# Patient Record
Sex: Female | Born: 1955 | Race: White | Hispanic: No | Marital: Married | Smoking: Former smoker
Health system: Southern US, Community
[De-identification: ages and names within clinical notes are randomized; demographics above are authoritative.]

## PROBLEM LIST (undated history)

## (undated) DIAGNOSIS — E785 Hyperlipidemia, unspecified: Secondary | ICD-10-CM

## (undated) HISTORY — PX: CHOLECYSTECTOMY: SHX55

## (undated) HISTORY — PX: BREAST REDUCTION SURGERY: SHX8

## (undated) HISTORY — DX: Hyperlipidemia, unspecified: E78.5

---

## 2001-04-15 ENCOUNTER — Emergency Department (HOSPITAL_COMMUNITY): Admission: EM | Admit: 2001-04-15 | Discharge: 2001-04-16 | Payer: Self-pay | Admitting: Emergency Medicine

## 2001-04-16 ENCOUNTER — Encounter: Payer: Self-pay | Admitting: Emergency Medicine

## 2001-04-16 ENCOUNTER — Ambulatory Visit (HOSPITAL_COMMUNITY): Admission: RE | Admit: 2001-04-16 | Discharge: 2001-04-16 | Payer: Self-pay | Admitting: Emergency Medicine

## 2003-10-01 ENCOUNTER — Other Ambulatory Visit: Admission: RE | Admit: 2003-10-01 | Discharge: 2003-10-01 | Payer: Self-pay | Admitting: Obstetrics and Gynecology

## 2005-03-31 ENCOUNTER — Other Ambulatory Visit: Admission: RE | Admit: 2005-03-31 | Discharge: 2005-03-31 | Payer: Self-pay | Admitting: Obstetrics and Gynecology

## 2019-03-22 DIAGNOSIS — M25559 Pain in unspecified hip: Secondary | ICD-10-CM

## 2019-03-22 HISTORY — DX: Pain in unspecified hip: M25.559

## 2019-11-04 ENCOUNTER — Ambulatory Visit (HOSPITAL_COMMUNITY)
Admission: EM | Admit: 2019-11-04 | Discharge: 2019-11-04 | Disposition: A | Discharge status: Home / Self Care | Payer: 59 | Attending: Family Medicine | Admitting: Family Medicine

## 2019-11-04 ENCOUNTER — Other Ambulatory Visit: Payer: Self-pay

## 2019-11-04 ENCOUNTER — Encounter (HOSPITAL_COMMUNITY): Payer: Self-pay

## 2019-11-04 DIAGNOSIS — N3090 Cystitis, unspecified without hematuria: Secondary | ICD-10-CM | POA: Diagnosis present

## 2019-11-04 LAB — POCT URINALYSIS DIPSTICK, ED / UC
Bilirubin Urine: NEGATIVE
Glucose, UA: >=1000 mg/dL — AB
Ketones, ur: 40 mg/dL — AB
Nitrite: NEGATIVE
Protein, ur: NEGATIVE mg/dL
Specific Gravity, Urine: 1.01 (ref 1.005–1.030)
Urobilinogen, UA: 0.2 mg/dL (ref 0.0–1.0)
pH: 5 (ref 5.0–8.0)

## 2019-11-04 NOTE — ED Provider Notes (Signed)
[facility]    CSN: [id number] Arrival date & time: 11/04/19  1628      History   Chief Complaint Chief Complaint  Patient presents with  . Dysuria    HPI Joann Campos is a 64 y.o. female.   She is presenting with increased urine frequency as well as dysuria.  Symptoms been ongoing for 3 days.  Denies any fevers or chills.  Has not tried any home modalities.  HPI  History reviewed. No pertinent past medical history.  There are no problems to display for this patient.   Past Surgical History:  Procedure Laterality Date  . CHOLECYSTECTOMY      OB History   No obstetric history on file.      Home Medications    Prior to Admission medications   Not on File    Family History Family History  Family history unknown: Yes    Social History Social History   Tobacco Use  . Smoking status: Former Research scientist (life sciences)  . Smokeless tobacco: Never Used  Substance Use Topics  . Alcohol use: Never  . Drug use: Never     Allergies   Patient has no known allergies.   Review of Systems Review of Systems  See HPI  Physical Exam Triage Vital Signs ED Triage Vitals  Enc Vitals Group     BP 11/04/19 1742 (!) 149/73     Pulse Rate 11/04/19 1742 94     Resp 11/04/19 1742 16     Temp 11/04/19 1742 97.8 F (36.6 C)     Temp src --      SpO2 11/04/19 1742 99 %     Weight --      Height --      Head Circumference --      Peak Flow --      Pain Score 11/04/19 1739 0     Pain Loc --      Pain Edu? --      Excl. in [place]? --    No data found.  Updated Vital Signs BP (!) 149/73   Pulse 94   Temp 97.8 F (36.6 C)   Resp 16   SpO2 99%   Visual Acuity Right Eye Distance:   Left Eye Distance:   Bilateral Distance:    Right Eye Near:   Left Eye Near:    Bilateral Near:     Physical Exam Gen: NAD, alert, cooperative with exam, well-appearing ENT: normal lips, normal nasal mucosa,  Eye: normal EOM, normal conjunctiva and lids  Skin: no rashes, no  areas of induration  Neuro: normal tone, normal sensation to touch Psych:  normal insight, alert and oriented    UC Treatments / Results  Labs (all labs ordered are listed, but only abnormal results are displayed) Labs Reviewed  POCT URINALYSIS DIPSTICK, ED / UC - Abnormal; Notable for the following components:      Result Value   Glucose, UA >=1000 (*)    Ketones, ur 40 (*)    Hgb urine dipstick LARGE (*)    Leukocytes,Ua TRACE (*)    All other components within normal limits  URINE CULTURE    EKG   Radiology No results found.  Procedures Procedures (including critical care time)  Medications Ordered in UC Medications - No data to display  Initial Impression / Assessment and Plan / UC Course  I have reviewed the triage vital signs and the nursing notes.  Pertinent labs & imaging results that were  available during my care of the patient were reviewed by me and considered in my medical decision making (see chart for details).     Ms. Oyer is a 64 year old female is presenting with urinary complaints.  Urinalysis was revealing for glucose, ketones, hemoglobin and trace leukocytes.  No prior history of diabetes or nephrolithiasis.  Urine culture was sent.  Counseled on close follow-up and repeat urinalysis.  Counseled on supportive care.  Final Clinical Impressions(s) / UC Diagnoses   Final diagnoses:  Cystitis     Discharge Instructions     Please follow up to have the urine tested again  We will call if the culture reveals an infection.     ED Prescriptions    None     PDMP not reviewed this encounter.   Rosemarie Ax, MD 11/04/19 2117

## 2019-11-04 NOTE — ED Triage Notes (Signed)
Pt c/o burning with urination and urinary frequency since Saturday PM.

## 2019-11-04 NOTE — Discharge Instructions (Addendum)
Please follow up to have the urine tested again  We will call if the culture reveals an infection.

## 2019-11-06 ENCOUNTER — Telehealth (HOSPITAL_COMMUNITY): Payer: Self-pay | Admitting: Emergency Medicine

## 2019-11-06 LAB — URINE CULTURE: Culture: 70000 — AB

## 2019-11-06 MED ORDER — NITROFURANTOIN MONOHYD MACRO 100 MG PO CAPS: 100.0000 mg | ORAL_CAPSULE | Freq: Two times a day (BID) | ORAL | 0 refills | Status: DC

## 2020-04-15 ENCOUNTER — Ambulatory Visit (HOSPITAL_COMMUNITY)
Admission: EM | Admit: 2020-04-15 | Discharge: 2020-04-15 | Disposition: A | Discharge status: Home / Self Care | Payer: 59 | Attending: Internal Medicine | Admitting: Internal Medicine

## 2020-04-15 ENCOUNTER — Other Ambulatory Visit: Payer: Self-pay

## 2020-04-15 ENCOUNTER — Encounter (HOSPITAL_COMMUNITY): Payer: Self-pay | Admitting: Emergency Medicine

## 2020-04-15 DIAGNOSIS — K648 Other hemorrhoids: Secondary | ICD-10-CM

## 2020-04-15 NOTE — Discharge Instructions (Signed)
Please monitor rectal bleeding If it worsens, you would need Anusol suppositories or Anusol rectal cream If bleeding persists please return to urgent care to be reevaluated.

## 2020-04-15 NOTE — ED Triage Notes (Signed)
Patient c/o hematuria that started this morning.   Patient denies fever  Patient denies burning  w/ urination or ABD pain.   Patient hasn't used any medications for symptoms.

## 2020-04-15 NOTE — ED Provider Notes (Signed)
[facility]    CSN: [id number] Arrival date & time: 04/15/20  3536      History   Chief Complaint Chief Complaint  Patient presents with  . Hematuria    HPI Joann Campos is a 65 y.o. female comes to the urgent care with complaints of bloody stool this morning.  Patient had one episode of diarrhea on stool and she noticed blood in the stool.  She is not sure if the blood was mixed with the stool but found some blood on the toilet paper when she wiped.  She has a history of hemorrhoids.  No dysuria, urgency or frequency.   HPI  Past Medical History:  Diagnosis Date  . Hip pain 2021    There are no problems to display for this patient.   Past Surgical History:  Procedure Laterality Date  . CHOLECYSTECTOMY      OB History   No obstetric history on file.      Home Medications    Prior to Admission medications   Not on File    Family History Family History  Family history unknown: Yes    Social History Social History   Tobacco Use  . Smoking status: Former Research scientist (life sciences)  . Smokeless tobacco: Never Used  Substance Use Topics  . Alcohol use: Never  . Drug use: Never     Allergies   Patient has no known allergies.   Review of Systems Review of Systems  Gastrointestinal: Positive for blood in stool. Negative for rectal pain.  Genitourinary: Negative for hematuria, vaginal bleeding and vaginal discharge.     Physical Exam Triage Vital Signs ED Triage Vitals  Enc Vitals Group     BP 04/15/20 0953 131/85     Pulse Rate 04/15/20 0953 97     Resp 04/15/20 0953 17     Temp 04/15/20 0953 98.6 F (37 C)     Temp Source 04/15/20 0953 Oral     SpO2 04/15/20 0953 99 %     Weight 04/15/20 0949 132 lb (59.9 kg)     Height 04/15/20 0949 5\' 5"  (1.651 m)     Head Circumference --      Peak Flow --      Pain Score 04/15/20 0949 0     Pain Loc --      Pain Edu? --      Excl. in [place]? --    No data found.  Updated Vital Signs BP 131/85 (BP  Location: Right Arm)   Pulse 97   Temp 98.6 F (37 C) (Oral)   Resp 17   Ht 5\' 5"  (1.651 m)   Wt 59.9 kg   SpO2 99%   BMI 21.97 kg/m   Visual Acuity Right Eye Distance:   Left Eye Distance:   Bilateral Distance:    Right Eye Near:   Left Eye Near:    Bilateral Near:     Physical Exam Vitals and nursing note reviewed.  Constitutional:      General: She is not in acute distress.    Appearance: She is not ill-appearing.  Cardiovascular:     Rate and Rhythm: Normal rate and regular rhythm.     Pulses: Normal pulses.     Heart sounds: Normal heart sounds.  Pulmonary:     Effort: Pulmonary effort is normal.     Breath sounds: Normal breath sounds.  Neurological:     Mental Status: She is alert.      UC Treatments /  Results  Labs (all labs ordered are listed, but only abnormal results are displayed) Labs Reviewed - No data to display  EKG   Radiology No results found.  Procedures Procedures (including critical care time)  Medications Ordered in UC Medications - No data to display  Initial Impression / Assessment and Plan / UC Course  I have reviewed the triage vital signs and the nursing notes.  Pertinent labs & imaging results that were available during my care of the patient were reviewed by me and considered in my medical decision making (see chart for details).     1.  Bleeding internal hemorrhoids, currently subsided Patient is advised to monitor for further bleeding. If recurs, she may benefit from Anusol suppository or cream. Return precautions given Final Clinical Impressions(s) / UC Diagnoses   Final diagnoses:  Internal hemorrhoid, bleeding     Discharge Instructions     Please monitor rectal bleeding If it worsens, you would need Anusol suppositories or Anusol rectal cream If bleeding persists please return to urgent care to be reevaluated.   ED Prescriptions    None     PDMP not reviewed this encounter.   Chase Picket,  MD 04/15/20 1725

## 2020-07-15 ENCOUNTER — Other Ambulatory Visit: Payer: Self-pay

## 2020-07-15 ENCOUNTER — Ambulatory Visit (INDEPENDENT_AMBULATORY_CARE_PROVIDER_SITE_OTHER): Payer: 59 | Admitting: Family Medicine

## 2020-07-15 ENCOUNTER — Encounter: Payer: Self-pay | Admitting: Family Medicine

## 2020-07-15 DIAGNOSIS — M25511 Pain in right shoulder: Secondary | ICD-10-CM

## 2020-07-15 MED ORDER — MELOXICAM 15 MG PO TABS: 7.5000 mg | ORAL_TABLET | Freq: Every day | ORAL | 6 refills | Status: DC | PRN

## 2020-07-15 NOTE — Progress Notes (Signed)
I saw and examined the patient with Dr. Elouise Munroe and agree with assessment and plan as outlined.    Acute right shoulder pain, no injury.  Exam consistent with impingement.  Will try mobic, home exercises.  If pain worsens, PT and/or injection.

## 2020-07-15 NOTE — Progress Notes (Signed)
Office Visit Note   Patient: Joann Campos           Date of Birth: [birth year]           MRN: [id number] Visit Date: 07/15/2020 Requested by: No referring provider defined for this encounter. PCP: Patient, No Pcp Per (Inactive)  Subjective: Chief Complaint  Patient presents with  . Right Shoulder - Pain    Pain came on about 2 weeks ago and has been worsening. NKI. Today the pain is not as bad. Decreased ROM due to pain.     HPI: 65yo F presenting to clinic with concerns of approximately 2 months of worsening, atraumatic right shoulder pain. Patient states that she has no idea what she did to inflame her shoulder, but does admit that she tends to sleep on her right side, and may have slept wrong. Pain is worsened with reaching across her body, or overhead. She says that she does 76min exercise videos daily, which don't significantly worsen her pain, and that she did not change this routine prior to the onset of her symptoms. She says she has been trying to rest the shoulder, thinking this might help, but it hasn't offered any relief. Says that her pain has progressed to a near constant ache, which is very painful if she rolls onto the affected side at night. No neck pain, no weakness in the hand/arm.               ROS:   All other systems were reviewed and are negative.  Objective: Vital Signs: There were no vitals taken for this visit.  Physical Exam:  General:  Alert and oriented, in no acute distress. Pulm:  Breathing unlabored. Psy:  Normal mood, congruent affect. Skin:  Right shoulder with no bruising, rashes, or erythema. Overlying skin intact.   Right Shoulder Exam:  Inspection: Symmetric muscle mass, no atrophy or deformity, no scars. Palpation: Endorses tenderness around the area of the subacromial space. No tenderness over the [facility] joint or biceps insertion.   Range of motion: Full range of motion in forward flexion, abduction and extension.  Full External rotation to 90  degrees. Apley scratch symmetrical at approximately level of T5 vertebrae. Does endorse some discomfort with Abduction above 90*.  Rotator cuff testing:  Full strength though endorses pain with empty can (supraspinatus).  External rotation with full strength though endorses some pain. Full strength and no pain with internal rotation.   Biceps/Labral testing: O'Brien's/speeds with full strength and no pain. Biceps load II: Negative Crank test: Negative  Impingement testing: Endorses pain with Hawkins  Strength testing:  5 out of 5 strength with shoulder abduction (C5), wrist extension (C6), wrist flexion (C7), grip strength (C8), and finger abduction (T1).  Sensation: Intact to light touch throughout bilateral upper extremities.   Brisk distal capillary refill.   Imaging: No results found.  Assessment & Plan: 65yo F presenting to clinic with right shoulder impingement. No weakness on examination, which is very reassuring against a rotator cuff tear as a source of her discomfort.  - Will start with conservative treatment including rotator cuff home exercises as well as oral NSAID therapy - Discussed safe NSAID use, and will provide with Mobic - Emailed exercise video program - RTC if no benefit in 4 weeks of HEP, at which time could consider subacromial injection. - Patient expresses understanding with plan. She has no further questions or concerns today.      Procedures: No procedures performed  PMFS History: There are no problems to display for this patient.  Past Medical History:  Diagnosis Date  . Hip pain 2021    Family History  Family history unknown: Yes    Past Surgical History:  Procedure Laterality Date  . CHOLECYSTECTOMY     Social History   Occupational History  . Not on file  Tobacco Use  . Smoking status: Former Research scientist (life sciences)  . Smokeless tobacco: Never Used  Substance and Sexual Activity  . Alcohol use: Never  . Drug use: Never  . Sexual  activity: Not on file

## 2020-12-04 ENCOUNTER — Encounter: Payer: Self-pay | Admitting: Surgical

## 2020-12-04 ENCOUNTER — Ambulatory Visit (INDEPENDENT_AMBULATORY_CARE_PROVIDER_SITE_OTHER): Payer: 59 | Admitting: Surgical

## 2020-12-04 ENCOUNTER — Other Ambulatory Visit: Payer: Self-pay

## 2020-12-04 ENCOUNTER — Ambulatory Visit: Payer: Self-pay

## 2020-12-04 DIAGNOSIS — M7541 Impingement syndrome of right shoulder: Secondary | ICD-10-CM

## 2020-12-04 DIAGNOSIS — M542 Cervicalgia: Secondary | ICD-10-CM | POA: Diagnosis not present

## 2020-12-04 DIAGNOSIS — M25511 Pain in right shoulder: Secondary | ICD-10-CM

## 2020-12-04 MED ORDER — BUPIVACAINE HCL 0.5 % IJ SOLN
9.0000 mL | INTRAMUSCULAR | Status: AC | PRN
  Administered 2020-12-04: 9 mL via INTRA_ARTICULAR

## 2020-12-04 MED ORDER — LIDOCAINE HCL 1 % IJ SOLN
5.0000 mL | INTRAMUSCULAR | Status: AC | PRN
  Administered 2020-12-04: 5 mL

## 2020-12-04 MED ORDER — METHYLPREDNISOLONE ACETATE 40 MG/ML IJ SUSP
40.0000 mg | INTRAMUSCULAR | Status: AC | PRN
  Administered 2020-12-04: 40 mg via INTRA_ARTICULAR

## 2020-12-04 NOTE — Progress Notes (Signed)
Office Visit Note   Patient: Joann Campos           Date of Birth: [birth year]           MRN: [id number] Visit Date: 12/04/2020 Requested by: No referring provider defined for this encounter. PCP: Patient, No Pcp Per (Inactive)  Subjective: Chief Complaint  Patient presents with   Right Shoulder - Follow-up    HPI: Joann Campos is a 65 y.o. female who presents to the office complaining of right shoulder pain.  Patient localizes pain to the lateral aspect of the shoulder with radiation to the mid humerus.  Complains of 5/10 pain.  Is aching.  Sharp with movement.  Takes over-the-counter medications mostly such as NSAIDs or Tylenol.  She she saw Dr. Junius Roads several months ago who recommended home exercise program and she did not want injection at the time so she did not have any.  She has no history of previous surgery to the shoulder.  Denies any neck pain, scapular pain, numbness/tingling down the arm.  No history of diabetes, smoking, thyroid disorders.  No recent falls or injuries.  It does not wake her up from sleep at night but does kind of keep her from going to sleep.  No significant pain with specific activities that she is noted.  She denies any significant weakness but she does feel like there is some slight weakness at times but this is mostly due to pain in her opinion..                ROS: All systems reviewed are negative as they relate to the chief complaint within the history of present illness.  Patient denies fevers or chills.  Assessment & Plan: Visit Diagnoses:  1. Right shoulder pain, unspecified chronicity   2. Neck pain     Plan: Patient is a 65 year old female who presents complaint of right shoulder pain.  She has history of right shoulder pain over the last several months with insidious onset and no history of injury.  No history of prior surgery.  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  No acute injuries noted.  Discussed options available to patient.  After discussion, plan to try right shoulder subacromial injection.  She tolerated the injection well.  Plan to follow-up in 6 weeks for clinical recheck and if no improvement at that time could consider MRI arthrogram of the right shoulder for further evaluation of rotator cuff pathology given the nighttime pain she is having more frequently now..  Follow-Up Instructions: No follow-ups on file.   Orders:  Orders Placed This Encounter  Procedures   XR Shoulder Right   XR Cervical Spine 2 or 3 views   No orders of the defined types were placed in this encounter.     Procedures: Large Joint Inj: R subacromial bursa on 12/04/2020 5:28 PM Indications: diagnostic evaluation and pain Details: 18 G 1.5 in needle, posterior approach  Arthrogram: No  Medications: 9 mL bupivacaine 0.5 %; 40 mg methylPREDNISolone acetate 40 MG/ML; 5 mL lidocaine 1 % Outcome: tolerated well, no immediate complications Procedure, treatment alternatives, risks and benefits explained, specific risks discussed. Consent was given by the patient. Immediately prior to procedure a time out was called to verify the correct patient, procedure, equipment, support staff and site/side marked as required. Patient was prepped and draped in the usual sterile fashion.  Clinical Data: No additional findings.  Objective: Vital Signs: There were no vitals taken for this visit.  Physical Exam:  Constitutional: Patient appears well-developed HEENT:  Head: Normocephalic Eyes:EOM are normal Neck: Normal range of motion Cardiovascular: Normal rate Pulmonary/chest: Effort normal Neurologic: Patient is alert Skin: Skin is warm Psychiatric: Patient has normal mood and affect  Ortho Exam: Ortho exam demonstrates right shoulder with 60 degrees external Tatian, 90 degrees abduction, 160 degrees forward flexion.   Excellent rotator cuff strength of supra, infra, subscap.  No significant tenderness over the [facility] joint.  No tenderness of the bicipital groove.  5/5 motor strength of bilateral grip strength, finger abduction, pronation/supination, bicep, tricep, deltoid.  No tenderness of the axial cervical spine.  Negative Spurling sign.  Negative Hornblower sign.  Negative belly press test.  Positive Hawkins and Neer's impingement sign.  Specialty Comments:  No specialty comments available.  Imaging: No results found.   PMFS History: There are no problems to display for this patient.  Past Medical History:  Diagnosis Date   Hip pain 2021    Family History  Family history unknown: Yes    Past Surgical History:  Procedure Laterality Date   CHOLECYSTECTOMY     Social History   Occupational History   Not on file  Tobacco Use   Smoking status: Former   Smokeless tobacco: Never  Substance and Sexual Activity   Alcohol use: Never   Drug use: Never   Sexual activity: Not on file

## 2021-01-21 ENCOUNTER — Other Ambulatory Visit: Payer: Self-pay

## 2021-01-21 ENCOUNTER — Ambulatory Visit: Payer: Self-pay

## 2021-01-21 ENCOUNTER — Ambulatory Visit (INDEPENDENT_AMBULATORY_CARE_PROVIDER_SITE_OTHER): Payer: 59 | Admitting: Surgical

## 2021-01-21 ENCOUNTER — Encounter: Payer: Self-pay | Admitting: Surgical

## 2021-01-21 DIAGNOSIS — M7541 Impingement syndrome of right shoulder: Secondary | ICD-10-CM

## 2021-01-21 DIAGNOSIS — M25511 Pain in right shoulder: Secondary | ICD-10-CM

## 2021-01-21 DIAGNOSIS — M7501 Adhesive capsulitis of right shoulder: Secondary | ICD-10-CM | POA: Diagnosis not present

## 2021-01-21 MED ORDER — LIDOCAINE HCL 1 % IJ SOLN
5.0000 mL | INTRAMUSCULAR | Status: AC | PRN
  Administered 2021-01-21: 5 mL

## 2021-01-21 MED ORDER — METHYLPREDNISOLONE ACETATE 40 MG/ML IJ SUSP
40.0000 mg | INTRAMUSCULAR | Status: AC | PRN
  Administered 2021-01-21: 40 mg via INTRA_ARTICULAR

## 2021-01-21 MED ORDER — BUPIVACAINE HCL 0.5 % IJ SOLN
9.0000 mL | INTRAMUSCULAR | Status: AC | PRN
  Administered 2021-01-21: 9 mL via INTRA_ARTICULAR

## 2021-01-21 NOTE — Progress Notes (Signed)
Office Visit Note   Patient: Joann Campos           Date of Birth: 1955/10/30           MRN: [id number] Visit Date: 01/21/2021 Requested by: No referring provider defined for this encounter. PCP: Patient, No Pcp Per (Inactive)  Subjective: No chief complaint on file.   HPI: Joann Campos is a 65 y.o. female who returns to the office for follow-up visit.  She is 6 weeks out from right shoulder cortisone injection.  Patient now returns with no improvement compared with prior appointment.  Reports she had no relief from the injection even for a day.  She reports progressive worsening of right shoulder pain since the last visit about 6 weeks ago.  Continues to complain of pain primarily in the lateral aspect of the right shoulder as well as some mid humerus pain.  No radicular pain or numbness or tingling.  She has no neck pain.  She specifically feels pain when she tries to externally rotate her arm or AB duct her arm away from her body.  She denies any precipitating injury that she can remember.  She does endorse history of adhesive capsulitis of the left shoulder about 15 years ago that she treated successfully with physical therapy.  She denies any weakness today.  No mechanical symptoms.  She has difficulty sleeping at night and this is a bigger problem for her than it was at her last visit.  Pain is not waking her up from night but she does notice when she wakes at night from other problems that her left shoulder is very achy.  She finds it difficult to lay on the right side.  Denies any history of diabetes or any thyroid disorders.              ROS: All systems reviewed are negative as they relate to the chief complaint within the history of present illness.  Patient denies fevers or chills.  Assessment & Plan: Visit Diagnoses:  1. Impingement syndrome of right shoulder     Plan: Joann Campos is a 65 y.o. female who returns to the office for follow-up visit for right shoulder  pain following cortisone injection 6 weeks ago.  They now return with no improvement from right shoulder subacromial injection.  She has progressive worsening of her right shoulder pain that she localizes to the lateral and mid humerus regions.  No rotator cuff weakness on exam today but her passive motion of the right shoulder does seem reduced compared with previous exam as well as compared with the contralateral shoulder today.  Most of her pain is with passive motion of the shoulder.  Radiographs of the right shoulder were unremarkable in the past.  Impression after today's examination is right shoulder adhesive capsulitis though patient states that this does not quite feel like the same timeline that she had in the left shoulder 15 years ago. Discussed options available to patient.  Main options for Symphonie would be trying glenohumeral cortisone injection today with dedicated home exercise program every day versus MRI arthrogram of the right shoulder to evaluate for rotator cuff tendinopathy versus adhesive capsulitis.  After discussion, patient would like to proceed with glenohumeral injection.  Ultrasound-guided right glenohumeral injection was administered and patient tolerated the procedure well.  She will do home exercise program and allow the cortisone injection to take effect but if she finds no relief in 2 to 3 weeks, she will call the office and  we will schedule her for MRI arthrogram of the right shoulder.  She does have history of claustrophobia so she will require Valium and possibly an open MRI but she may try to tough it out since the normal MRI is more sensitive than the open MRI.  Otherwise, if she is having good relief then she will follow-up in 6 weeks for clinical recheck on the range of motion.  Follow-Up Instructions: No follow-ups on file.   Orders:  No orders of the defined types were placed in this encounter.  No orders of the defined types were placed in this encounter.      Procedures: Large Joint Inj: R glenohumeral on 01/21/2021 11:30 AM Indications: diagnostic evaluation and pain Details: 18 G 1.5 in and 3.5 in needle, ultrasound-guided posterior approach  Arthrogram: No  Medications: 9 mL bupivacaine 0.5 %; 40 mg methylPREDNISolone acetate 40 MG/ML; 5 mL lidocaine 1 % Outcome: tolerated well, no immediate complications Procedure, treatment alternatives, risks and benefits explained, specific risks discussed. Consent was given by the patient. Immediately prior to procedure a time out was called to verify the correct patient, procedure, equipment, support staff and site/side marked as required. Patient was prepped and draped in the usual sterile fashion.      Clinical Data: No additional findings.  Objective: Vital Signs: There were no vitals taken for this visit.  Physical Exam:  Constitutional: Patient appears well-developed HEENT:  Head: Normocephalic Eyes:EOM are normal Neck: Normal range of motion Cardiovascular: Normal rate Pulmonary/chest: Effort normal Neurologic: Patient is alert Skin: Skin is warm Psychiatric: Patient has normal mood and affect  Ortho Exam: Ortho exam demonstrates right shoulder with 40 degrees external rotation, 80 degrees abduction, 155 degrees forward flexion.  This is compared with the left shoulder with 50 degrees external rotation, 100 degrees abduction, 170 degrees forward flexion.  Decreased internal rotation of the right shoulder behind her back by about 8 vertebral levels compared with the left shoulder where she can lift her left hand much higher on her spine.  She has excellent rotator cuff strength of supraspinatus, infraspinatus, subscapularis with no significantly increased pain with rotator cuff strength testing.  Most of her pain is with passive motion of the shoulder when examining her range of motion.  Minimal tenderness in the bicipital groove.  No tenderness over the [facility] joint.  5/5 motor strength of  bilateral grip strength, finger abduction, pronation/supination, bicep, tricep, deltoid.  No tenderness over the axial cervical spine.  Negative Spurling sign.  Negative Lhermitte sign.  Sensation intact through all dermatomes of the bilateral upper extremities.  Specialty Comments:  No specialty comments available.  Imaging: No results found.   PMFS History: There are no problems to display for this patient.  Past Medical History:  Diagnosis Date   Hip pain 2021    Family History  Family history unknown: Yes    Past Surgical History:  Procedure Laterality Date   CHOLECYSTECTOMY     Social History   Occupational History   Not on file  Tobacco Use   Smoking status: Former   Smokeless tobacco: Never  Substance and Sexual Activity   Alcohol use: Never   Drug use: Never   Sexual activity: Not on file

## 2021-03-05 ENCOUNTER — Ambulatory Visit: Payer: 59 | Admitting: Surgical

## 2021-03-05 ENCOUNTER — Telehealth: Payer: Self-pay

## 2021-03-05 NOTE — Telephone Encounter (Signed)
Ok thx.

## 2021-03-05 NOTE — Telephone Encounter (Signed)
Patient had an appointment scheduled for this afternoon to see Luke about her shoulder. Due to OR delays patient was called and asked to come in at a later time as Luke would not be able to make it on time at originally scheduled appt time.  Unfortunately had to call patient again due to the fact longer than expected delays in the OR. Patient was very upset about this and voiced her frustration and concern. I did thoroughly listen to patient and voiced to her I was incredibly sorry for this but that it was out of my control. She proceeded to tell me how we had ruined her entire day and we were delaying her care. I did let patient know that again was out of my control but she could discuss with Dr Dean and Luke. I did work patient in for next Thursday to see Dr Dean to discuss her shoulder pain.  Patient did have a question regarding a COVID vaccine and getting shoulder injection close together.  I promised her that I would try to discuss this with Dr Dean and would call her back before 5:30pm as requested. I discussed this with Dr Dean and he suggested patient choose one or the other. I called patient back and discussed with her. She did tell me she felt she was not talked to in a professional manner and that I never should have told her to discuss her concerns further with Dr Dean she could. I apologized to patient. She continued to say that we were delaying her care due to the issues today. She stated everything regarding her plans she made for the next several day were all interrupted by today. Again, multiple apologies made to patient. She stated that now if she had to have imaging she would not get it done until next year. I explained this is a difficult time of year for most of our patients because like her deductibles have been met and patients are trying to get things done before end of the year. Patient very put out by the fact we did not have sooner available appointment for her. I did work her in as the  last patient on Monday afternoon. I explained to her in detail the schedule was incredibly busy and full and she should expect to have to wait. Wanted you to be aware of what transpired today.  °

## 2021-03-08 ENCOUNTER — Other Ambulatory Visit: Payer: Self-pay

## 2021-03-08 ENCOUNTER — Ambulatory Visit (INDEPENDENT_AMBULATORY_CARE_PROVIDER_SITE_OTHER): Payer: 59 | Admitting: Orthopedic Surgery

## 2021-03-08 ENCOUNTER — Ambulatory Visit: Payer: 59 | Admitting: Orthopedic Surgery

## 2021-03-08 DIAGNOSIS — M7541 Impingement syndrome of right shoulder: Secondary | ICD-10-CM

## 2021-03-08 DIAGNOSIS — M7501 Adhesive capsulitis of right shoulder: Secondary | ICD-10-CM | POA: Diagnosis not present

## 2021-03-09 ENCOUNTER — Telehealth: Payer: Self-pay

## 2021-03-09 MED ORDER — DICLOFENAC SODIUM 75 MG PO TBEC: DELAYED_RELEASE_TABLET | ORAL | 0 refills | Status: DC

## 2021-03-09 NOTE — Telephone Encounter (Signed)
This has been addressed.

## 2021-03-09 NOTE — Telephone Encounter (Signed)
Pt called into the office stating that she has not received a call regarding an apt today with Dr. Ernestina Patches like she stated she was promised she said she would like that call to be made before 10 today.

## 2021-03-09 NOTE — Telephone Encounter (Signed)
Can you send in diclofenac 75 po bid for 2 weeks thx

## 2021-03-09 NOTE — Telephone Encounter (Signed)
Submitted to pharmacy. Tried calling patient to advise done. No answer.

## 2021-03-09 NOTE — Addendum Note (Signed)
Addended byLaurann Montana on: 03/09/2021 02:21 PM   Modules accepted: Orders

## 2021-03-09 NOTE — Telephone Encounter (Signed)
Patient would also like to know if her medication from yesterday was sent in.   Please advise

## 2021-03-10 ENCOUNTER — Ambulatory Visit: Payer: Self-pay

## 2021-03-10 ENCOUNTER — Other Ambulatory Visit: Payer: Self-pay

## 2021-03-10 ENCOUNTER — Encounter: Payer: Self-pay | Admitting: Physical Medicine and Rehabilitation

## 2021-03-10 ENCOUNTER — Ambulatory Visit (INDEPENDENT_AMBULATORY_CARE_PROVIDER_SITE_OTHER): Payer: 59 | Admitting: Physical Medicine and Rehabilitation

## 2021-03-10 ENCOUNTER — Ambulatory Visit: Payer: 59 | Admitting: Physical Therapy

## 2021-03-10 ENCOUNTER — Encounter: Payer: Self-pay | Admitting: Orthopedic Surgery

## 2021-03-10 DIAGNOSIS — M25511 Pain in right shoulder: Secondary | ICD-10-CM | POA: Diagnosis not present

## 2021-03-10 DIAGNOSIS — G8929 Other chronic pain: Secondary | ICD-10-CM | POA: Diagnosis not present

## 2021-03-10 MED ORDER — TRIAMCINOLONE ACETONIDE 40 MG/ML IJ SUSP
40.0000 mg | INTRAMUSCULAR | Status: AC | PRN
  Administered 2021-03-10: 08:00:00 40 mg via INTRA_ARTICULAR

## 2021-03-10 MED ORDER — BUPIVACAINE HCL 0.25 % IJ SOLN
5.0000 mL | INTRAMUSCULAR | Status: AC | PRN
  Administered 2021-03-10: 08:00:00 5 mL via INTRA_ARTICULAR

## 2021-03-10 NOTE — Progress Notes (Signed)
Pt state right shoulder pain. Pt state lifting and moving her right arm makes the pain worse. Pt state she takes pain meds to help ease her pain.  Numeric Pain Rating Scale and Functional Assessment Average Pain 4   In the last MONTH (on 0-10 scale) has pain interfered with the following?  1. General activity like being  able to carry out your everyday physical activities such as walking, climbing stairs, carrying groceries, or moving a chair?  Rating(10)   -BT, -Dye Allergies.

## 2021-03-10 NOTE — Progress Notes (Signed)
° °  Office Visit Note   Patient: Joann Campos           Date of Birth: 1955-10-10           MRN: [id number] Visit Date: 03/08/2021 Requested by: No referring provider defined for this encounter. PCP: Patient, No Pcp Per (Inactive)  Subjective: Chief Complaint  Patient presents with   Right Shoulder - Pain    HPI: Mathew is a 65 year old patient with right shoulder pain.  Injection 01/21/2021 did not really help.  Reports generally constant pain and it hurts when she is doing certain movements.  She is right-hand dominant.  Localizes the pain in the coracoid region as well as deltoid region.  Painful at night.  Overhead is very painful.  Denies any neck pain or numbness and tingling and no mechanical symptoms.  She states she has lost motion since her last clinic visit.  Cannot tie her hair in a ponytail.  Hard for her to take off close.  Motrin has not been particularly beneficial over the past several weeks.              ROS: All systems reviewed are negative as they relate to the chief complaint within the history of present illness.  Patient denies  fevers or chills.   Assessment & Plan: Visit Diagnoses:  1. Adhesive capsulitis of right shoulder     Plan: Impression is adhesive capsulitis right shoulder with definite loss of passive range of motion.  Strength seems pretty reasonable.  Plan is injection with Dr. Ernestina Patches into the glenohumeral joint with physical therapy.  Follow-up in 8 weeks for clinical recheck.  She may need to consider surgical manipulation and rotator interval release at that time.  Voltaren prescribed to be taken instead of ibuprofen.  Follow-Up Instructions: Return in about 8 weeks (around 05/03/2021).   Orders:  No orders of the defined types were placed in this encounter.  No orders of the defined types were placed in this encounter.     Procedures: No procedures performed   Clinical Data: No additional findings.  Objective: Vital Signs: There  were no vitals taken for this visit.  Physical Exam:   Constitutional: Patient appears well-developed HEENT:  Head: Normocephalic Eyes:EOM are normal Neck: Normal range of motion Cardiovascular: Normal rate Pulmonary/chest: Effort normal Neurologic: Patient is alert Skin: Skin is warm Psychiatric: Patient has normal mood and affect   Ortho Exam: Ortho exam demonstrates good cervical spine range of motion with good rotator cuff strength on the left right-hand side infraspinatus supraspinatus subscap muscle testing.  No masses lymphadenopathy or skin changes noted in the right shoulder girdle region.  Range of motion on the left passive is 70/85/175.  Range of motion on the right passive is 25/40/80.  Specialty Comments:  No specialty comments available.  Imaging: No results found.   PMFS History: There are no problems to display for this patient.  Past Medical History:  Diagnosis Date   Hip pain 2021    Family History  Family history unknown: Yes    Past Surgical History:  Procedure Laterality Date   CHOLECYSTECTOMY     Social History   Occupational History   Not on file  Tobacco Use   Smoking status: Former   Smokeless tobacco: Never  Substance and Sexual Activity   Alcohol use: Never   Drug use: Never   Sexual activity: Not on file

## 2021-03-10 NOTE — Progress Notes (Signed)
° °  Thereasa Campos - 65 y.o. female MRN [id number]  Date of birth: 1955/08/25  Office Visit Note: Visit Date: 03/10/2021 PCP: Patient, No Pcp Per (Inactive) Referred by: No ref. provider found  Subjective: Chief Complaint  Patient presents with   Right Shoulder - Pain   HPI:  Joann Campos is a 65 y.o. female who comes in today at the request of Dr. Anderson Malta for planned Right anesthetic glenohumeral arthrogram with fluoroscopic guidance.  The patient has failed conservative care including home exercise, medications, time and activity modification.  This injection will be diagnostic and hopefully therapeutic.  Please see requesting physician notes for further details and justification.   ROS Otherwise per HPI.  Assessment & Plan: Visit Diagnoses:    ICD-10-CM   1. Chronic right shoulder pain  M25.511 XR C-ARM NO REPORT   G89.29 Large Joint Inj: R glenohumeral      Plan: No additional findings.   Meds & Orders: No orders of the defined types were placed in this encounter.   Orders Placed This Encounter  Procedures   Large Joint Inj: R glenohumeral   XR C-ARM NO REPORT    Follow-up: Return in about 7 weeks (around 04/28/2021).   Procedures: Large Joint Inj: R glenohumeral on 03/10/2021 8:26 AM Indications: pain and diagnostic evaluation Details: 22 G 3.5 in needle, fluoroscopy-guided anteromedial approach  Arthrogram: No  Medications: 40 mg triamcinolone acetonide 40 MG/ML; 5 mL bupivacaine 0.25 % Outcome: tolerated well, no immediate complications  There was excellent flow of contrast producing a partial arthrogram of the glenohumeral joint. The patient did have relief of symptoms during the anesthetic phase of the injection. Procedure, treatment alternatives, risks and benefits explained, specific risks discussed. Consent was given by the patient. Immediately prior to procedure a time out was called to verify the correct patient, procedure, equipment, support staff  and site/side marked as required. Patient was prepped and draped in the usual sterile fashion.         Clinical History: No specialty comments available.     Objective:  VS:  HT:     WT:    BMI:      BP:    HR: bpm   TEMP: ( )   RESP:  Physical Exam   Imaging: No results found.

## 2021-03-11 ENCOUNTER — Ambulatory Visit: Payer: 59 | Admitting: Orthopedic Surgery

## 2021-03-11 ENCOUNTER — Encounter: Payer: Self-pay | Admitting: Rehabilitative and Restorative Service Providers"

## 2021-03-11 ENCOUNTER — Ambulatory Visit (INDEPENDENT_AMBULATORY_CARE_PROVIDER_SITE_OTHER): Payer: 59 | Admitting: Rehabilitative and Restorative Service Providers"

## 2021-03-11 DIAGNOSIS — R600 Localized edema: Secondary | ICD-10-CM

## 2021-03-11 DIAGNOSIS — M25511 Pain in right shoulder: Secondary | ICD-10-CM | POA: Diagnosis not present

## 2021-03-11 DIAGNOSIS — G8929 Other chronic pain: Secondary | ICD-10-CM

## 2021-03-11 DIAGNOSIS — M25611 Stiffness of right shoulder, not elsewhere classified: Secondary | ICD-10-CM | POA: Diagnosis not present

## 2021-03-11 DIAGNOSIS — M6281 Muscle weakness (generalized): Secondary | ICD-10-CM | POA: Diagnosis not present

## 2021-03-11 NOTE — Therapy (Signed)
[facility] Physical Therapy [address] [place], [place], [address] Phone: [phone]   Fax:  [phone]  Physical Therapy Evaluation  Patient Details  Name: Joann Campos MRN: [id number] Date of Birth: 1955/10/03 Referring Provider (PT): Meredith Pel MD   Encounter Date: 03/11/2021   PT End of Session - 03/11/21 1714     Visit Number 1    Number of Visits 24    Date for PT Re-Evaluation 06/03/21    Progress Note Due on Visit 10    PT Start Time 0849    PT Stop Time 0932    PT Time Calculation (min) 43 min    Activity Tolerance Patient limited by pain    Behavior During Therapy [facility] for tasks assessed/performed             Past Medical History:  Diagnosis Date   Hip pain 2021    Past Surgical History:  Procedure Laterality Date   CHOLECYSTECTOMY      There were no vitals filed for this visit.    Subjective Assessment - 03/11/21 1711     Subjective Joann Campos has had R shoulder pain and stiffness for almost a year.  She is unable to put her hair in a ponytail or function above shoulder height with her R arm.    Pertinent History Previous L shoulder adhesive capsulitis    Limitations Lifting;House hold activities    Patient Stated Goals Be able to use her R arm normally    Currently in Pain? Yes    Pain Score 5     Pain Location Shoulder    Pain Orientation Right    Pain Descriptors / Indicators Aching;Sharp;Constant;Tightness    Pain Type Chronic pain    Pain Radiating Towards Lateral arm above the elbow    Pain Onset More than a month ago    Pain Frequency Constant    Aggravating Factors  Reaching, overhead function, sleeping on the R side    Pain Relieving Factors Some relief with topical and pain meds    Effect of Pain on Daily Activities Can't function above shoulder level with the R shoulder    Multiple Pain Sites No                [facility] PT Assessment - 03/11/21 0001       Assessment   Medical Diagnosis R  frozen shoulder    Referring Provider (PT) Meredith Pel MD    Onset Date/Surgical Date --   Almost a year   Hand Dominance Right    Prior Therapy L shoulder      Precautions   Precautions Shoulder    Type of Shoulder Precautions Avoid impingement      Restrictions   Weight Bearing Restrictions No    Other Position/Activity Restrictions Avoid impingement postures      Balance Screen   Has the patient fallen in the past 6 months No    Has the patient had a decrease in activity level because of a fear of falling?  No    Is the patient reluctant to leave their home because of a fear of falling?  No      Home Ecologist residence      Prior Function   Level of Independence Independent    Vocation Full time employment    Vocation Requirements Works in Designer, multimedia at [employer], put hair in a pony tail  Cognition   Overall Cognitive Status Within Functional Limits for tasks assessed      Observation/Other Assessments   Focus on Therapeutic Outcomes (FOTO)  49 (Goal 64 in 13 visits)      Posture/Postural Control   Posture/Postural Control Postural limitations    Postural Limitations Rounded Shoulders      ROM / Strength   AROM / PROM / Strength AROM;Strength      AROM   Overall AROM  Deficits    AROM Assessment Site Shoulder    Right/Left Shoulder Left;Right    Right Shoulder Flexion 90 Degrees    Right Shoulder Internal Rotation 20 Degrees   20 degrees (in supine at 30 degrees abduction)   Right Shoulder External Rotation 15 Degrees   15 degrees (in supine at 30 degrees abduction)   Right Shoulder Horizontal  ADduction 15 Degrees    Left Shoulder Flexion 160 Degrees    Left Shoulder Internal Rotation 55 Degrees    Left Shoulder External Rotation 70 Degrees    Left Shoulder Horizontal ADduction 50 Degrees      Strength   Overall Strength Deficits    Overall Strength Comments Deferred R side due to  significant AROM impairments    Strength Assessment Site Shoulder    Right/Left Shoulder Left;Right    Left Shoulder Internal Rotation --   16.7 pounds   Left Shoulder External Rotation --   13.7 pounds                       Objective measurements completed on examination: See above findings.       [facility] Adult PT Treatment/Exercise - 03/11/21 0001       Exercises   Exercises Shoulder      Shoulder Exercises: Supine   Protraction AAROM;Right;10 reps;Limitations    Protraction Limitations Palm in and reach up to ceiling    External Rotation AROM;Right;10 reps;Limitations    External Rotation Limitations 5 seconds (at 30 degrees abduction, progress to 70-80 degrees abduction when ready)    Internal Rotation AROM;Right;10 reps;Limitations    Internal Rotation Limitations 5 seconds (at 30 degrees abduction, progress to 70-80 degrees abduction when ready)      Shoulder Exercises: Seated   Other Seated Exercises Shoulder blade pinches 10X 5 seconds    Other Seated Exercises Posterior capsule stretch 10X 10 seconds                     PT Education - 03/11/21 1714     Education Details Reviewed exam findings, shoulder anatomy and starter HEP.    Person(s) Educated Patient    Methods Explanation;Handout;Demonstration;Tactile cues;Verbal cues    Comprehension Verbal cues required;Need further instruction;Returned demonstration;Verbalized understanding;Tactile cues required              PT Short Term Goals - 03/11/21 1723       PT SHORT TERM GOAL #1   Title Improve R shoulder AROM for flexion to 135 degrees; ER to 45 degrees (at 70 degrees abduction); IR to 50 degrees (at 70 degrees abduction) and horizontal adduction to 40 degrees.    Baseline 90; 15; 20 and 15 with IR/ER at 30 degrees abduction    Time 6    Period Weeks    Status New    Target Date 04/22/21               PT Long Term Goals - 03/11/21 1725  PT LONG TERM GOAL #1    Title Improve FOTO to 64.    Baseline 49    Time 12    Period Weeks    Status New    Target Date 06/03/21      PT LONG TERM GOAL #2   Title Improve R shoulder pain to consistently 0-2/10 on the Numeric Pain Rating Scale.    Baseline Can be 5-6/10    Time 12    Period Weeks    Status New    Target Date 06/03/21      PT LONG TERM GOAL #3   Title Improve R shoulder AROM for flexion to 160; ER to 80; IR to 60 and horizontal adduction to 40 degrees.    Baseline 90; 15; 20; 15    Time 12    Period Weeks    Status New    Target Date 06/03/21      PT LONG TERM GOAL #4   Title Improve R shoulder strength to 100% of the uninvolved L.    Baseline Deferred at eval due to significant AROM impairments.    Time 12    Period Weeks    Status New    Target Date 06/03/21      PT LONG TERM GOAL #5   Title Joann Campos will be independent with her long-term HEP at DC.    Baseline Started today    Time 98    Period Weeks    Status New    Target Date 06/03/21                    Plan - 03/11/21 1719     Clinical Impression Statement Joann Campos has had R shoulder pain for almost a year.  Capsular tightness is significant.  R shoulder strength testing is deferred as AROM is so severely impaired.  If she is out of the freezing stage, she will do great with supervised PT.  If no objective progress is noted in 4 weeks, we may have to wait a bit to re-start PT.    Comorbidities Previous L frozen shoulder    Examination-Activity Limitations Reach Overhead;Dressing;Bed Mobility;Hygiene/Grooming;Sleep;Lift;Carry    Examination-Participation Restrictions Community Activity    Stability/Clinical Decision Making Stable/Uncomplicated    Clinical Decision Making Low    Rehab Potential Good    PT Frequency 2x / week    PT Duration 12 weeks    PT Treatment/Interventions ADLs/Self Care Home Management;Cryotherapy;Therapeutic activities;Neuromuscular re-education;Therapeutic exercise;Patient/family  education;Manual techniques;Passive range of motion;Vasopneumatic Device;Joint Manipulations    PT Next Visit Plan Review HEP, capsular stretching    PT Home Exercise Plan Signed   Access Code: [email or web address]  URL: [email or web address]  Date: 03/11/2021  Prepared by: Vista Mink     Exercises  Supine Scapular Protraction in Flexion with Dumbbells - 2-3 x daily - 7 x weekly - 1 sets - 20-30 reps - 3 seconds hold  Supine Shoulder Internal Rotation Stretch - 2-3 x daily - 7 x weekly - 1 sets - 20 reps - 5-10 seconds hold  Supine Shoulder External Rotation Stretch - 2-3 x daily - 7 x weekly - 1 sets - 20 reps - 5-10 seconds hold  Standing Scapular Retraction - 5 x daily - 7 x weekly - 1 sets - 5 reps - 5 second hold  Standing Shoulder Posterior Capsule Stretch - 2-3 x daily - 7 x weekly - 1 sets - 10 reps - 10 seconds hold  Last Modified by Maricela Bo, PT on 03/11/2021 at  9:31 AM    Consulted and Agree with Plan of Care Patient             Patient will benefit from skilled therapeutic intervention in order to improve the following deficits and impairments:  Decreased activity tolerance, Decreased endurance, Decreased range of motion, Decreased strength, Hypomobility, Increased edema, Impaired perceived functional ability, Impaired flexibility, Impaired UE functional use, Pain  Visit Diagnosis: Stiffness of right shoulder, not elsewhere classified  Chronic right shoulder pain  Muscle weakness (generalized)  Localized edema     Problem List There are no problems to display for this patient.   Farley Ly, PT, MPT 03/11/2021, 5:29 PM  [facility] Physical Therapy [address] [place], [place], [address] Phone: [phone]   Fax:  [phone]  Name: Joann Campos MRN: [id number] Date of Birth: 1956/01/17

## 2021-03-11 NOTE — Patient Instructions (Signed)
Access Code: [email or web address] URL: [email or web address] Date: 03/11/2021 Prepared by: Vista Mink  Exercises Supine Scapular Protraction in Flexion with Dumbbells - 2-3 x daily - 7 x weekly - 1 sets - 20-30 reps - 3 seconds hold Supine Shoulder Internal Rotation Stretch - 2-3 x daily - 7 x weekly - 1 sets - 20 reps - 5-10 seconds hold Supine Shoulder External Rotation Stretch - 2-3 x daily - 7 x weekly - 1 sets - 20 reps - 5-10 seconds hold Standing Scapular Retraction - 5 x daily - 7 x weekly - 1 sets - 5 reps - 5 second hold Standing Shoulder Posterior Capsule Stretch - 2-3 x daily - 7 x weekly - 1 sets - 10 reps - 10 seconds hold

## 2021-03-18 ENCOUNTER — Other Ambulatory Visit: Payer: Self-pay

## 2021-03-18 ENCOUNTER — Ambulatory Visit (INDEPENDENT_AMBULATORY_CARE_PROVIDER_SITE_OTHER): Payer: 59 | Admitting: Physical Therapy

## 2021-03-18 DIAGNOSIS — M25611 Stiffness of right shoulder, not elsewhere classified: Secondary | ICD-10-CM

## 2021-03-18 DIAGNOSIS — R600 Localized edema: Secondary | ICD-10-CM | POA: Diagnosis not present

## 2021-03-18 DIAGNOSIS — G8929 Other chronic pain: Secondary | ICD-10-CM

## 2021-03-18 DIAGNOSIS — M25511 Pain in right shoulder: Secondary | ICD-10-CM | POA: Diagnosis not present

## 2021-03-18 DIAGNOSIS — M6281 Muscle weakness (generalized): Secondary | ICD-10-CM | POA: Diagnosis not present

## 2021-03-18 NOTE — Therapy (Signed)
[facility] Physical Therapy [address] [place], [place], [address] Phone: [phone]   Fax:  [phone]  Physical Therapy Treatment  Patient Details  Name: Joann Campos MRN: [id number] Date of Birth: 1955-06-28 Referring Provider (PT): Meredith Pel MD   Encounter Date: 03/18/2021   PT End of Session - 03/18/21 0811     Visit Number 2    Number of Visits 24    Date for PT Re-Evaluation 06/03/21    Progress Note Due on Visit 10    PT Start Time 0800    PT Stop Time 0840    PT Time Calculation (min) 40 min    Activity Tolerance Patient limited by pain    Behavior During Therapy [facility] for tasks assessed/performed             Past Medical History:  Diagnosis Date   Hip pain 2021    Past Surgical History:  Procedure Laterality Date   CHOLECYSTECTOMY      There were no vitals filed for this visit.   Subjective Assessment - 03/18/21 0800     Subjective she relays about 2/10 pain upon arrival, but her shoulder hurts more when she does any activity with it    Pertinent History Previous L shoulder adhesive capsulitis    Limitations Lifting;House hold activities    Patient Stated Goals Be able to use her R arm normally    Pain Onset More than a month ago                               [facility] Adult PT Treatment/Exercise - 03/18/21 0001       Shoulder Exercises: Seated   Other Seated Exercises tableslide stretching 5 sec X10 for Rt shoulder flex, abd, ER      Shoulder Exercises: Standing   Other Standing Exercises scapular retractions 5 sec X10    Other Standing Exercises pendulums X15 CW,CCW      Shoulder Exercises: Pulleys   Flexion 2 minutes    ABduction 2 minutes      Shoulder Exercises: ROM/Strengthening   UBE (Upper Arm Bike) L1 2 min fwd, 2 min retro      Manual Therapy   Manual therapy comments Rt shoulder GH mobs grade 1-2 for distraction, A-P, P-A and inferior, Rt shoulder PROM to tolerance all  planes                       PT Short Term Goals - 03/11/21 1723       PT SHORT TERM GOAL #1   Title Improve R shoulder AROM for flexion to 135 degrees; ER to 45 degrees (at 70 degrees abduction); IR to 50 degrees (at 70 degrees abduction) and horizontal adduction to 40 degrees.    Baseline 90; 15; 20 and 15 with IR/ER at 30 degrees abduction    Time 6    Period Weeks    Status New    Target Date 04/22/21               PT Long Term Goals - 03/11/21 1725       PT LONG TERM GOAL #1   Title Improve FOTO to 64.    Baseline 49    Time 12    Period Weeks    Status New    Target Date 06/03/21      PT LONG TERM GOAL #2   Title Improve  R shoulder pain to consistently 0-2/10 on the Numeric Pain Rating Scale.    Baseline Can be 5-6/10    Time 12    Period Weeks    Status New    Target Date 06/03/21      PT LONG TERM GOAL #3   Title Improve R shoulder AROM for flexion to 160; ER to 80; IR to 60 and horizontal adduction to 40 degrees.    Baseline 90; 15; 20; 15    Time 12    Period Weeks    Status New    Target Date 06/03/21      PT LONG TERM GOAL #4   Title Improve R shoulder strength to 100% of the uninvolved L.    Baseline Deferred at eval due to significant AROM impairments.    Time 12    Period Weeks    Status New    Target Date 06/03/21      PT LONG TERM GOAL #5   Title Rossi will be independent with her long-term HEP at [place].    Baseline Started today    Time 18    Period Weeks    Status New    Target Date 06/03/21                   Plan - 03/18/21 0817     Clinical Impression Statement Session focused on Rt shoulder ROM exercises today in as much ROM as tolerated. She will need consistent and dedicated stretching within her pain tolerance to break through her adhesive capsulitis. Continue POC.    Comorbidities Previous L frozen shoulder    Examination-Activity Limitations Reach Overhead;Dressing;Bed  Mobility;Hygiene/Grooming;Sleep;Lift;Carry    Examination-Participation Restrictions Community Activity    Stability/Clinical Decision Making Stable/Uncomplicated    Rehab Potential Good    PT Frequency 2x / week    PT Duration 12 weeks    PT Treatment/Interventions ADLs/Self Care Home Management;Cryotherapy;Therapeutic activities;Neuromuscular re-education;Therapeutic exercise;Patient/family education;Manual techniques;Passive range of motion;Vasopneumatic Device;Joint Manipulations    PT Next Visit Plan capsular stretching and early ROM focus    PT Home Exercise Plan Signed   Access Code: [email or web address]  URL: [email or web address]  Date: 03/11/2021  Prepared by: Vista Mink     Exercises  Supine Scapular Protraction in Flexion with Dumbbells - 2-3 x daily - 7 x weekly - 1 sets - 20-30 reps - 3 seconds hold  Supine Shoulder Internal Rotation Stretch - 2-3 x daily - 7 x weekly - 1 sets - 20 reps - 5-10 seconds hold  Supine Shoulder External Rotation Stretch - 2-3 x daily - 7 x weekly - 1 sets - 20 reps - 5-10 seconds hold  Standing Scapular Retraction - 5 x daily - 7 x weekly - 1 sets - 5 reps - 5 second hold  Standing Shoulder Posterior Capsule Stretch - 2-3 x daily - 7 x weekly - 1 sets - 10 reps - 10 seconds hold. Added table stretch AAROM flexion, abd, ER 5sec hold X10 each Elsie Ra)    Consulted and Agree with Plan of Care Patient             Patient will benefit from skilled therapeutic intervention in order to improve the following deficits and impairments:  Decreased activity tolerance, Decreased endurance, Decreased range of motion, Decreased strength, Hypomobility, Increased edema, Impaired perceived functional ability, Impaired flexibility, Impaired UE functional use, Pain  Visit Diagnosis: Stiffness of right shoulder, not elsewhere classified  Chronic right shoulder pain  Muscle weakness (generalized)  Localized edema     Problem List There are no problems  to display for this patient.   Debbe Odea, PT,DPT 03/18/2021, 8:41 AM  [facility] Physical Therapy [address] [place], [place], [address] Phone: [phone]   Fax:  [phone]  Name: Joann Campos MRN: [id number] Date of Birth: 07-27-1955

## 2021-03-30 ENCOUNTER — Encounter: Payer: Self-pay | Admitting: Physical Therapy

## 2021-03-30 ENCOUNTER — Ambulatory Visit (INDEPENDENT_AMBULATORY_CARE_PROVIDER_SITE_OTHER): Payer: 59 | Admitting: Physical Therapy

## 2021-03-30 ENCOUNTER — Other Ambulatory Visit: Payer: Self-pay

## 2021-03-30 DIAGNOSIS — R600 Localized edema: Secondary | ICD-10-CM

## 2021-03-30 DIAGNOSIS — M25611 Stiffness of right shoulder, not elsewhere classified: Secondary | ICD-10-CM | POA: Diagnosis not present

## 2021-03-30 DIAGNOSIS — M6281 Muscle weakness (generalized): Secondary | ICD-10-CM | POA: Diagnosis not present

## 2021-03-30 DIAGNOSIS — M25511 Pain in right shoulder: Secondary | ICD-10-CM | POA: Diagnosis not present

## 2021-03-30 DIAGNOSIS — G8929 Other chronic pain: Secondary | ICD-10-CM

## 2021-03-30 NOTE — Therapy (Signed)
[facility] Physical Therapy [address] [place], [place], [address] Phone: [phone]   Fax:  [phone]  Physical Therapy Treatment  Patient Details  Name: Joann Campos MRN: [id number] Date of Birth: 1956-01-09 Referring Provider (PT): Joann Pel MD   Encounter Date: 03/30/2021   PT End of Session - 03/30/21 0835     Visit Number 3    Number of Visits 24    Date for PT Re-Evaluation 06/03/21    Progress Note Due on Visit 10    PT Start Time 0800    PT Stop Time 0840    PT Time Calculation (min) 40 min    Activity Tolerance Patient limited by pain;Patient tolerated treatment well    Behavior During Therapy [facility] for tasks assessed/performed             Past Medical History:  Diagnosis Date   Hip pain 2021    Past Surgical History:  Procedure Laterality Date   CHOLECYSTECTOMY      There were no vitals filed for this visit.   Subjective Assessment - 03/30/21 0807     Subjective she relays her shoulder feels like it is getting better and improving with stiffness    Pertinent History Previous L shoulder adhesive capsulitis    Limitations Lifting;House hold activities    Patient Stated Goals Be able to use her R arm normally    Pain Onset More than a month ago                [facility] PT Assessment - 03/30/21 0001       Assessment   Medical Diagnosis R frozen shoulder    Referring Provider (PT) Joann Pel MD      AROM   Right Shoulder Flexion 105 Degrees    Right Shoulder ABduction 90 Degrees    Right Shoulder Internal Rotation --   L5 behind back   Right Shoulder External Rotation --   C6 behind head                          [facility] Adult PT Treatment/Exercise - 03/30/21 0001       Shoulder Exercises: Seated   Other Seated Exercises tableslide stretching 5 sec X10 for Rt shoulder flex, abd, ER    Other Seated Exercises Posterior capsule stretch 5X 10 seconds      Shoulder Exercises:  Standing   Other Standing Exercises wall ladder 5 sec x10 flexion and abd      Shoulder Exercises: Pulleys   Flexion 2 minutes    ABduction 2 minutes      Manual Therapy   Manual therapy comments Rt shoulder GH mobs grade 2-3 for distraction, A-P, P-A and inferior, Rt shoulder PROM to tolerance all planes                       PT Short Term Goals - 03/11/21 1723       PT SHORT TERM GOAL #1   Title Improve R shoulder AROM for flexion to 135 degrees; ER to 45 degrees (at 70 degrees abduction); IR to 50 degrees (at 70 degrees abduction) and horizontal adduction to 40 degrees.    Baseline 90; 15; 20 and 15 with IR/ER at 30 degrees abduction    Time 6    Period Weeks    Status New    Target Date 04/22/21  PT Long Term Goals - 03/11/21 1725       PT LONG TERM GOAL #1   Title Improve FOTO to 64.    Baseline 49    Time 12    Period Weeks    Status New    Target Date 06/03/21      PT LONG TERM GOAL #2   Title Improve R shoulder pain to consistently 0-2/10 on the Numeric Pain Rating Scale.    Baseline Can be 5-6/10    Time 12    Period Weeks    Status New    Target Date 06/03/21      PT LONG TERM GOAL #3   Title Improve R shoulder AROM for flexion to 160; ER to 80; IR to 60 and horizontal adduction to 40 degrees.    Baseline 90; 15; 20; 15    Time 12    Period Weeks    Status New    Target Date 06/03/21      PT LONG TERM GOAL #4   Title Improve R shoulder strength to 100% of the uninvolved L.    Baseline Deferred at eval due to significant AROM impairments.    Time 12    Period Weeks    Status New    Target Date 06/03/21      PT LONG TERM GOAL #5   Title [name] will be independent with her long-term HEP at [place].    Baseline Started today    Time 66    Period Weeks    Status New    Target Date 06/03/21                   Plan - 03/30/21 0859     Clinical Impression Statement She showed improvements in Rt shoulder ROM  measurements today but still significantly limited and will continue to benefit from PT.    Comorbidities Previous L frozen shoulder    Examination-Activity Limitations Reach Overhead;Dressing;Bed Mobility;Hygiene/Grooming;Sleep;Lift;Carry    Examination-Participation Restrictions Community Activity    Stability/Clinical Decision Making Stable/Uncomplicated    Rehab Potential Good    PT Frequency 2x / week    PT Duration 12 weeks    PT Treatment/Interventions ADLs/Self Care Home Management;Cryotherapy;Therapeutic activities;Neuromuscular re-education;Therapeutic exercise;Patient/family education;Manual techniques;Passive range of motion;Vasopneumatic Device;Joint Manipulations    PT Next Visit Plan capsular stretching and early ROM focus    PT Home Exercise Plan Signed   Access Code: [email or web address]  URL: [email or web address]  Date: 03/11/2021  Prepared by: Vista Mink     Exercises  Supine Scapular Protraction in Flexion with Dumbbells - 2-3 x daily - 7 x weekly - 1 sets - 20-30 reps - 3 seconds hold  Supine Shoulder Internal Rotation Stretch - 2-3 x daily - 7 x weekly - 1 sets - 20 reps - 5-10 seconds hold  Supine Shoulder External Rotation Stretch - 2-3 x daily - 7 x weekly - 1 sets - 20 reps - 5-10 seconds hold  Standing Scapular Retraction - 5 x daily - 7 x weekly - 1 sets - 5 reps - 5 second hold  Standing Shoulder Posterior Capsule Stretch - 2-3 x daily - 7 x weekly - 1 sets - 10 reps - 10 seconds hold. Added table stretch AAROM flexion, abd, ER 5sec hold X10 each Elsie Ra)    Consulted and Agree with Plan of Care Patient             Patient will benefit from skilled therapeutic intervention in order  to improve the following deficits and impairments:  Decreased activity tolerance, Decreased endurance, Decreased range of motion, Decreased strength, Hypomobility, Increased edema, Impaired perceived functional ability, Impaired flexibility, Impaired UE functional use,  Pain  Visit Diagnosis: Stiffness of right shoulder, not elsewhere classified  Chronic right shoulder pain  Muscle weakness (generalized)  Localized edema     Problem List There are no problems to display for this patient.   Debbe Odea, PT,DPT 03/30/2021, 9:01 AM  [facility] Physical Therapy [address] [place], [place], [address] Phone: [phone]   Fax:  [phone]  Name: Joann Campos MRN: [id number] Date of Birth: 10/12/1955

## 2021-04-01 ENCOUNTER — Other Ambulatory Visit: Payer: Self-pay

## 2021-04-01 ENCOUNTER — Encounter: Payer: Self-pay | Admitting: Rehabilitative and Restorative Service Providers"

## 2021-04-01 ENCOUNTER — Ambulatory Visit (INDEPENDENT_AMBULATORY_CARE_PROVIDER_SITE_OTHER): Payer: 59 | Admitting: Rehabilitative and Restorative Service Providers"

## 2021-04-01 DIAGNOSIS — M25511 Pain in right shoulder: Secondary | ICD-10-CM | POA: Diagnosis not present

## 2021-04-01 DIAGNOSIS — M6281 Muscle weakness (generalized): Secondary | ICD-10-CM | POA: Diagnosis not present

## 2021-04-01 DIAGNOSIS — R600 Localized edema: Secondary | ICD-10-CM

## 2021-04-01 DIAGNOSIS — G8929 Other chronic pain: Secondary | ICD-10-CM

## 2021-04-01 DIAGNOSIS — M25611 Stiffness of right shoulder, not elsewhere classified: Secondary | ICD-10-CM | POA: Diagnosis not present

## 2021-04-01 NOTE — Patient Instructions (Signed)
Access Code: [email or web address] URL: [email or web address] Date: 04/01/2021 Prepared by: Vista Mink  Exercises Supine Scapular Protraction in Flexion with Dumbbells - 2-3 x daily - 7 x weekly - 1 sets - 20-30 reps - 3 seconds hold Supine Shoulder Internal Rotation Stretch - 2-3 x daily - 7 x weekly - 1 sets - 20 reps - 5-10 seconds hold Supine Shoulder External Rotation Stretch - 2-3 x daily - 7 x weekly - 1 sets - 20 reps - 5-10 seconds hold Standing Scapular Retraction - 5 x daily - 7 x weekly - 1 sets - 5 reps - 5 second hold Standing Shoulder Posterior Capsule Stretch - 2-3 x daily - 7 x weekly - 1 sets - 10 reps - 10 seconds hold Seated Shoulder Flexion AAROM with Pulley Behind - 2 x daily - 7 x weekly - 1 sets - 10 reps - 10 seconds hold

## 2021-04-01 NOTE — Therapy (Signed)
[facility] Physical Therapy [address] [place], [place], [address] Phone: [phone]   Fax:  [phone]  Physical Therapy Treatment  Patient Details  Name: Endia Moncur MRN: [id number] Date of Birth: Sep 16, 1955 Referring Provider (PT): Meredith Pel MD   Encounter Date: 04/01/2021   PT End of Session - 04/01/21 1254     Visit Number 4    Number of Visits 24    Date for PT Re-Evaluation 06/03/21    Progress Note Due on Visit 10    PT Start Time 0800    PT Stop Time 0845    PT Time Calculation (min) 45 min    Activity Tolerance Patient tolerated treatment well    Behavior During Therapy [facility] for tasks assessed/performed             Past Medical History:  Diagnosis Date   Hip pain 2021    Past Surgical History:  Procedure Laterality Date   CHOLECYSTECTOMY      There were no vitals filed for this visit.   Subjective Assessment - 04/01/21 0838     Subjective Benjamine Mola notes early AROM progress.  Sleeping is also improving.  HEP compliance is inconsistent.    Pertinent History Previous L shoulder adhesive capsulitis    Limitations Lifting;House hold activities    Patient Stated Goals Be able to use her R arm normally    Currently in Pain? Yes    Pain Score 2     Pain Location Shoulder    Pain Orientation Right    Pain Descriptors / Indicators Tightness;Aching    Pain Radiating Towards Lateral upper arm    Pain Onset More than a month ago    Pain Frequency Intermittent    Aggravating Factors  Reaching, overhead function and sleeping on the R side    Pain Relieving Factors Exercises, ibuprofen    Effect of Pain on Daily Activities Difficulty reaching (overhead and behind the back) or sleeping on the R side    Multiple Pain Sites No                [facility] PT Assessment - 04/01/21 0001       ROM / Strength   AROM / PROM / Strength AROM      AROM   Overall AROM  Deficits    AROM Assessment Site Shoulder    Right/Left  Shoulder Right    Right Shoulder Flexion 125 Degrees    Right Shoulder Internal Rotation 50 Degrees   at 50 degrees abduction   Right Shoulder External Rotation 40 Degrees   at 50 degrees abduction   Right Shoulder Horizontal  ADduction 35 Degrees                           [facility] Adult PT Treatment/Exercise - 04/01/21 0001       Posture/Postural Control   Posture/Postural Control Postural limitations    Postural Limitations Rounded Shoulders      Exercises   Exercises Shoulder      Shoulder Exercises: Supine   Protraction AAROM;Right;20 reps;Limitations    Protraction Limitations Palm in and reach up to ceiling    External Rotation AROM;Right;10 reps;Limitations    External Rotation Limitations 10 seconds (at 50 degrees abduction, progress to 70-80 degrees abduction when ready)    Internal Rotation AROM;Right;10 reps;Limitations    Internal Rotation Limitations 10 seconds (at 50 degrees abduction, progress to 70-80 degrees abduction when ready)  Flexion AAROM;Right;10 reps;Limitations    Flexion Limitations 10 seconds palm in reach up 1st      Shoulder Exercises: Seated   Other Seated Exercises Shoulder blade pinches 10X 5 seconds    Other Seated Exercises Posterior capsule stretch 10X 10 seconds      Shoulder Exercises: Pulleys   Flexion Limitations 10X 10 seconds palm in and protract 1st    Scaption Limitations 10X 10 seconds palm in and protract 1st                     PT Education - 04/01/21 1252     Education Details Reviewed HEP with progressions to pulley.    Person(s) Educated Patient    Methods Explanation;Demonstration;Tactile cues;Verbal cues;Handout    Comprehension Verbalized understanding;Tactile cues required;Returned demonstration;Need further instruction;Verbal cues required              PT Short Term Goals - 04/01/21 1253       PT SHORT TERM GOAL #1   Title Improve R shoulder AROM for flexion to 135 degrees; ER to 45  degrees (at 70 degrees abduction); IR to 50 degrees (at 70 degrees abduction) and horizontal adduction to 40 degrees.    Baseline 125; 40; 50; 35 (was 90; 15; 20 and 15 with IR/ER at 30 degrees abduction)    Time 6    Period Weeks    Status On-going    Target Date 04/22/21               PT Long Term Goals - 03/11/21 1725       PT LONG TERM GOAL #1   Title Improve FOTO to 64.    Baseline 49    Time 12    Period Weeks    Status New    Target Date 06/03/21      PT LONG TERM GOAL #2   Title Improve R shoulder pain to consistently 0-2/10 on the Numeric Pain Rating Scale.    Baseline Can be 5-6/10    Time 12    Period Weeks    Status New    Target Date 06/03/21      PT LONG TERM GOAL #3   Title Improve R shoulder AROM for flexion to 160; ER to 80; IR to 60 and horizontal adduction to 40 degrees.    Baseline 90; 15; 20; 15    Time 12    Period Weeks    Status New    Target Date 06/03/21      PT LONG TERM GOAL #4   Title Improve R shoulder strength to 100% of the uninvolved L.    Baseline Deferred at eval due to significant AROM impairments.    Time 12    Period Weeks    Status New    Target Date 06/03/21      PT LONG TERM GOAL #5   Title Vona will be independent with her long-term HEP at DC.    Baseline Started today    Time 12    Period Weeks    Status New    Target Date 06/03/21                   Plan - 04/01/21 1255     Clinical Impression Statement Even with less than ideal compliance, AROM is making significant progress.  With increased HEP compliance, Renad should meet LTGs in the expected time (see LTGs).    Comorbidities Previous L frozen shoulder  Examination-Activity Limitations Reach Overhead;Dressing;Bed Mobility;Hygiene/Grooming;Sleep;Lift;Carry    Examination-Participation Restrictions Community Activity    Stability/Clinical Decision Making Stable/Uncomplicated    Rehab Potential Good    PT Frequency 2x / week    PT  Duration 12 weeks    PT Treatment/Interventions ADLs/Self Care Home Management;Cryotherapy;Therapeutic activities;Neuromuscular re-education;Therapeutic exercise;Patient/family education;Manual techniques;Passive range of motion;Vasopneumatic Device;Joint Manipulations    PT Next Visit Plan Capsular stretching and early AROM focus    PT Home Exercise Plan Signed   Access Code: [email or web address]  URL: [email or web address]  Date: 03/11/2021  Prepared by: Vista Mink     Exercises  Supine Scapular Protraction in Flexion with Dumbbells - 2-3 x daily - 7 x weekly - 1 sets - 20-30 reps - 3 seconds hold  Supine Shoulder Internal Rotation Stretch - 2-3 x daily - 7 x weekly - 1 sets - 20 reps - 5-10 seconds hold  Supine Shoulder External Rotation Stretch - 2-3 x daily - 7 x weekly - 1 sets - 20 reps - 5-10 seconds hold  Standing Scapular Retraction - 5 x daily - 7 x weekly - 1 sets - 5 reps - 5 second hold  Standing Shoulder Posterior Capsule Stretch - 2-3 x daily - 7 x weekly - 1 sets - 10 reps - 10 seconds hold. Added table stretch AAROM flexion, abd, ER 5sec hold X10 each Elsie Ra)    Consulted and Agree with Plan of Care Patient             Patient will benefit from skilled therapeutic intervention in order to improve the following deficits and impairments:  Decreased activity tolerance, Decreased endurance, Decreased range of motion, Decreased strength, Hypomobility, Increased edema, Impaired perceived functional ability, Impaired flexibility, Impaired UE functional use, Pain  Visit Diagnosis: Stiffness of right shoulder, not elsewhere classified  Chronic right shoulder pain  Muscle weakness (generalized)  Localized edema     Problem List There are no problems to display for this patient.   Farley Ly, PT, MPT 04/01/2021, 12:57 PM  [facility] Physical Therapy [address] [place], [place], [address] Phone: [phone]   Fax:  [phone]  Name:  Lilia Letterman MRN: [id number] Date of Birth: 01-23-1956

## 2021-04-06 ENCOUNTER — Ambulatory Visit (INDEPENDENT_AMBULATORY_CARE_PROVIDER_SITE_OTHER): Payer: 59 | Admitting: Physical Therapy

## 2021-04-06 ENCOUNTER — Other Ambulatory Visit: Payer: Self-pay

## 2021-04-06 ENCOUNTER — Encounter: Payer: Self-pay | Admitting: Physical Therapy

## 2021-04-06 DIAGNOSIS — M25611 Stiffness of right shoulder, not elsewhere classified: Secondary | ICD-10-CM

## 2021-04-06 DIAGNOSIS — M6281 Muscle weakness (generalized): Secondary | ICD-10-CM

## 2021-04-06 DIAGNOSIS — M25511 Pain in right shoulder: Secondary | ICD-10-CM | POA: Diagnosis not present

## 2021-04-06 DIAGNOSIS — R600 Localized edema: Secondary | ICD-10-CM

## 2021-04-06 DIAGNOSIS — G8929 Other chronic pain: Secondary | ICD-10-CM

## 2021-04-06 NOTE — Therapy (Signed)
[facility] Physical Therapy [address] [place], [place], [address] Phone: [phone]   Fax:  [phone]  Physical Therapy Treatment  Patient Details  Name: Joann Campos MRN: [id number] Date of Birth: [birth year] Referring Provider (PT): Meredith Pel MD   Encounter Date: 04/06/2021   PT End of Session - 04/06/21 1152     Visit Number 5    Number of Visits 24    Date for PT Re-Evaluation 06/03/21    Progress Note Due on Visit 10    PT Start Time 6440    PT Stop Time 0925    PT Time Calculation (min) 38 min    Activity Tolerance Patient tolerated treatment well;Patient limited by pain    Behavior During Therapy [facility] for tasks assessed/performed             Past Medical History:  Diagnosis Date   Hip pain 2021    Past Surgical History:  Procedure Laterality Date   CHOLECYSTECTOMY      There were no vitals filed for this visit.   Subjective Assessment - 04/06/21 0938     Subjective Pt. states the L shoulder is feeling tight, stiff, and achey since Saturday night 1/14. Pt. stretched this weekend to assist with the stiffness and that helped a little. Pt. notes being able to put her shirt on overhead and coat on by herself.    Pertinent History Previous L shoulder adhesive capsulitis    Limitations Lifting;House hold activities    Patient Stated Goals Be able to use her R arm normally                               [facility] Adult PT Treatment/Exercise - 04/06/21 0001       Shoulder Exercises: Standing   Other Standing Exercises Finger ladder flexion and abduction (3 sets of 5 second hold) 3x    Other Standing Exercises UE Ranger flexion leaning in at end range(10 reps)      Shoulder Exercises: Pulleys   Flexion Limitations 1 minute flexion with 5 sec hold at top (2x)    Scaption Limitations 1 minute scaption with 5 second hold at top (2 x) abduction was too painful      Shoulder Exercises: ROM/Strengthening    Other ROM/Strengthening Exercises Standing; AAROM w/ 2lb yellow bar, flexion, abduction, ER, Extension, and IR (10x each, 5 second hold)      Manual Therapy   Manual Therapy Joint mobilization;Other (comment)    Joint Mobilization Anterior, Posterior, Inferior Mobs    Other Manual Therapy PROM for Flexion, Abduction, IR and ER stretching                       PT Short Term Goals - 04/01/21 1253       PT SHORT TERM GOAL #1   Title Improve R shoulder AROM for flexion to 135 degrees; ER to 45 degrees (at 70 degrees abduction); IR to 50 degrees (at 70 degrees abduction) and horizontal adduction to 40 degrees.    Baseline 125; 40; 50; 35 (was 90; 15; 20 and 15 with IR/ER at 30 degrees abduction)    Time 6    Period Weeks    Status On-going    Target Date 04/22/21               PT Long Term Goals - 03/11/21 1725       PT LONG  TERM GOAL #1   Title Improve FOTO to 64.    Baseline 49    Time 12    Period Weeks    Status New    Target Date 06/03/21      PT LONG TERM GOAL #2   Title Improve R shoulder pain to consistently 0-2/10 on the Numeric Pain Rating Scale.    Baseline Can be 5-6/10    Time 12    Period Weeks    Status New    Target Date 06/03/21      PT LONG TERM GOAL #3   Title Improve R shoulder AROM for flexion to 160; ER to 80; IR to 60 and horizontal adduction to 40 degrees.    Baseline 90; 15; 20; 15    Time 12    Period Weeks    Status New    Target Date 06/03/21      PT LONG TERM GOAL #4   Title Improve R shoulder strength to 100% of the uninvolved L.    Baseline Deferred at eval due to significant AROM impairments.    Time 12    Period Weeks    Status New    Target Date 06/03/21      PT LONG TERM GOAL #5   Title Joann Campos will be independent with her long-term HEP at [place].    Baseline Started today    Time 12    Period Weeks    Status New    Target Date 06/03/21                   Plan - 04/06/21 0943     Clinical  Impression Statement Pt. presents with stiff, and achey R shoulder. ROM exercises were performed to assist with increasing her available ROM. Exercises were tolerated well except for at the pulley's where abduction was too painful and we switched to scaption plane movement which was better tolerated. Stretching exercises and mobilizations  are progressing her ROM.    Comorbidities Previous L frozen shoulder    Examination-Activity Limitations Reach Overhead;Dressing;Bed Mobility;Hygiene/Grooming;Sleep;Lift;Carry    Examination-Participation Restrictions Community Activity    Stability/Clinical Decision Making Stable/Uncomplicated    Rehab Potential Good    PT Frequency 2x / week    PT Duration 12 weeks    PT Treatment/Interventions ADLs/Self Care Home Management;Cryotherapy;Therapeutic activities;Neuromuscular re-education;Therapeutic exercise;Patient/family education;Manual techniques;Passive range of motion;Vasopneumatic Device;Joint Manipulations    PT Next Visit Plan Continue with increasing overall AROM. Add additional ER exercises to HEP    PT Home Exercise Plan Signed   Access Code: [email or web address]  URL: [email or web address]  Date: 03/11/2021  Prepared by: Vista Mink     Exercises  Supine Scapular Protraction in Flexion with Dumbbells - 2-3 x daily - 7 x weekly - 1 sets - 20-30 reps - 3 seconds hold  Supine Shoulder Internal Rotation Stretch - 2-3 x daily - 7 x weekly - 1 sets - 20 reps - 5-10 seconds hold  Supine Shoulder External Rotation Stretch - 2-3 x daily - 7 x weekly - 1 sets - 20 reps - 5-10 seconds hold  Standing Scapular Retraction - 5 x daily - 7 x weekly - 1 sets - 5 reps - 5 second hold  Standing Shoulder Posterior Capsule Stretch - 2-3 x daily - 7 x weekly - 1 sets - 10 reps - 10 seconds hold. Added table stretch AAROM flexion, abd, ER 5sec hold X10 each Elsie Ra)    Consulted and Agree with Plan of  Care Patient             Patient will benefit from skilled  therapeutic intervention in order to improve the following deficits and impairments:  Decreased activity tolerance, Decreased endurance, Decreased range of motion, Decreased strength, Hypomobility, Increased edema, Impaired perceived functional ability, Impaired flexibility, Impaired UE functional use, Pain  Visit Diagnosis: Localized edema  Chronic right shoulder pain  Muscle weakness (generalized)  Stiffness of right shoulder, not elsewhere classified     Problem List There are no problems to display for this patient.   Joann Campos, Student-PT 04/06/2021, 12:22 PM  [facility] Physical Therapy [address] [place], [place], [address] Phone: [phone]   Fax:  [phone]  Name: Joann Campos MRN: [id number] Date of Birth: 1955-04-10

## 2021-04-08 ENCOUNTER — Other Ambulatory Visit: Payer: Self-pay

## 2021-04-08 ENCOUNTER — Ambulatory Visit (INDEPENDENT_AMBULATORY_CARE_PROVIDER_SITE_OTHER): Payer: 59 | Admitting: Rehabilitative and Restorative Service Providers"

## 2021-04-08 ENCOUNTER — Encounter: Payer: Self-pay | Admitting: Rehabilitative and Restorative Service Providers"

## 2021-04-08 DIAGNOSIS — M25511 Pain in right shoulder: Secondary | ICD-10-CM

## 2021-04-08 DIAGNOSIS — G8929 Other chronic pain: Secondary | ICD-10-CM

## 2021-04-08 DIAGNOSIS — M25611 Stiffness of right shoulder, not elsewhere classified: Secondary | ICD-10-CM | POA: Diagnosis not present

## 2021-04-08 DIAGNOSIS — M6281 Muscle weakness (generalized): Secondary | ICD-10-CM

## 2021-04-08 DIAGNOSIS — R600 Localized edema: Secondary | ICD-10-CM

## 2021-04-08 NOTE — Patient Instructions (Signed)
Access Code: [email or web address] URL: [email or web address] Date: 04/08/2021 Prepared by: Vista Mink  Exercises Supine Scapular Protraction in Flexion with Dumbbells - 2-3 x daily - 7 x weekly - 1 sets - 20-30 reps - 3 seconds hold Supine Shoulder Internal Rotation Stretch - 2-3 x daily - 7 x weekly - 1 sets - 20 reps - 5-10 seconds hold Supine Shoulder External Rotation Stretch - 2-3 x daily - 7 x weekly - 1 sets - 20 reps - 5-10 seconds hold Standing Scapular Retraction - 5 x daily - 7 x weekly - 1 sets - 5 reps - 5 second hold Standing Shoulder Posterior Capsule Stretch - 2-3 x daily - 7 x weekly - 1 sets - 10 reps - 10 seconds hold Seated Shoulder Flexion AAROM with Pulley Behind - 2 x daily - 7 x weekly - 1 sets - 10 reps - 10 seconds hold Standing Shoulder Internal Rotation Stretch with Hands Behind Back - 2-3 x daily - 7 x weekly - 1 sets - 10 reps - 10 seconds hold

## 2021-04-08 NOTE — Therapy (Signed)
[facility] Physical Therapy [address] [place], [place], [address] Phone: [phone]   Fax:  [phone]  Physical Therapy Treatment  Patient Details  Name: Joann Campos MRN: [id number] Date of Birth: 1955/07/14 Referring Provider (PT): Meredith Pel MD   Encounter Date: 04/08/2021   PT End of Session - 04/08/21 0840     Visit Number 6    Number of Visits 24    Date for PT Re-Evaluation 06/03/21    Progress Note Due on Visit 10    PT Start Time 0800    PT Stop Time 0839    PT Time Calculation (min) 39 min    Activity Tolerance Patient tolerated treatment well;Patient limited by pain    Behavior During Therapy [facility] for tasks assessed/performed             Past Medical History:  Diagnosis Date   Hip pain 2021    Past Surgical History:  Procedure Laterality Date   CHOLECYSTECTOMY      There were no vitals filed for this visit.   Subjective Assessment - 04/08/21 0810     Subjective Joann Campos notes progress with her sleep and with getting dressed and undressed.  She reports acceptable HEP compliance.    Pertinent History Previous L shoulder adhesive capsulitis    Limitations Lifting;House hold activities    Patient Stated Goals Be able to use her R arm normally    Currently in Pain? No/denies    Pain Score 0-No pain    Pain Location Shoulder    Pain Orientation Right    Pain Descriptors / Indicators Tightness    Pain Type Chronic pain    Pain Radiating Towards Lateral R arm    Pain Onset More than a month ago    Pain Frequency Occasional    Aggravating Factors  End range AROM (particularly overhead and behind the back) and sleeping on the R side.    Pain Relieving Factors Exercises and ibuprofen    Effect of Pain on Daily Activities Difficulty with most R UE function    Multiple Pain Sites No                [facility] PT Assessment - 04/08/21 0001       AROM   AROM Assessment Site Shoulder    Right/Left Shoulder Right     Right Shoulder Flexion 125 Degrees    Right Shoulder Internal Rotation 50 Degrees   at 50 degrees abduction in supine   Right Shoulder External Rotation 35 Degrees   supine at 50 degrees abduction   Right Shoulder Horizontal  ADduction 40 Degrees                           [facility] Adult PT Treatment/Exercise - 04/08/21 0001       Posture/Postural Control   Posture/Postural Control Postural limitations    Postural Limitations Rounded Shoulders      Therapeutic Activites    Therapeutic Activities ADL's    ADL's Pulley for overhead function, supine arm raise for reaching and thumb up the back for overhead function      Exercises   Exercises Shoulder      Shoulder Exercises: Supine   Protraction AAROM;Right;20 reps;Limitations    Protraction Limitations Palm in and reach up to ceiling    External Rotation AROM;Right;10 reps;Limitations    External Rotation Limitations 10 seconds (at 50 degrees abduction, progress to 70-80 degrees abduction when ready)  Internal Rotation AROM;Right;10 reps;Limitations    Internal Rotation Limitations 10 seconds (at 50 degrees abduction, progress to 70-80 degrees abduction when ready)    Flexion AAROM;Right;10 reps;Limitations    Flexion Limitations 10 seconds palm in reach up 1st      Shoulder Exercises: Seated   Other Seated Exercises Shoulder blade pinches 10X 5 seconds    Other Seated Exercises Posterior capsule stretch and thumb up the back stretch 10X 10 seconds each      Shoulder Exercises: Pulleys   Flexion Limitations 10X 10 seconds palm in and protract 1st    Scaption Limitations 10X 10 seconds palm in and protract 1st                     PT Education - 04/08/21 0839     Education Details Reviewed HEP with addition of thumb up the back stretch.    Person(s) Educated Patient    Methods Explanation;Demonstration;Tactile cues;Verbal cues;Handout    Comprehension Verbalized understanding;Tactile cues  required;Need further instruction;Returned demonstration;Verbal cues required              PT Short Term Goals - 04/01/21 1253       PT SHORT TERM GOAL #1   Title Improve R shoulder AROM for flexion to 135 degrees; ER to 45 degrees (at 70 degrees abduction); IR to 50 degrees (at 70 degrees abduction) and horizontal adduction to 40 degrees.    Baseline 125; 40; 50; 35 (was 90; 15; 20 and 15 with IR/ER at 30 degrees abduction)    Time 6    Period Weeks    Status On-going    Target Date 04/22/21               PT Long Term Goals - 03/11/21 1725       PT LONG TERM GOAL #1   Title Improve FOTO to 64.    Baseline 49    Time 12    Period Weeks    Status New    Target Date 06/03/21      PT LONG TERM GOAL #2   Title Improve R shoulder pain to consistently 0-2/10 on the Numeric Pain Rating Scale.    Baseline Can be 5-6/10    Time 12    Period Weeks    Status New    Target Date 06/03/21      PT LONG TERM GOAL #3   Title Improve R shoulder AROM for flexion to 160; ER to 80; IR to 60 and horizontal adduction to 40 degrees.    Baseline 90; 15; 20; 15    Time 12    Period Weeks    Status New    Target Date 06/03/21      PT LONG TERM GOAL #4   Title Improve R shoulder strength to 100% of the uninvolved L.    Baseline Deferred at eval due to significant AROM impairments.    Time 12    Period Weeks    Status New    Target Date 06/03/21      PT LONG TERM GOAL #5   Title Joann Campos will be independent with her long-term HEP at [place].    Baseline Started today    Time 12    Period Weeks    Status New    Target Date 06/03/21                   Plan - 04/08/21 0840     Clinical  Impression Statement Joann Campos has made objective progress since evaluation, although progress is not as impressive over the past week.  We discussed the importance of getting her exercises in before work in addition to after work and recommendations were made for 3X/day HEP participation  on off days from work.  Joann Campos's prognosis remains good with the recommended POC.    Comorbidities Previous L frozen shoulder    Examination-Activity Limitations Reach Overhead;Dressing;Bed Mobility;Hygiene/Grooming;Sleep;Lift;Carry    Examination-Participation Restrictions Community Activity    Stability/Clinical Decision Making Stable/Uncomplicated    Rehab Potential Good    PT Frequency 2x / week    PT Duration 12 weeks    PT Treatment/Interventions ADLs/Self Care Home Management;Cryotherapy;Therapeutic activities;Neuromuscular re-education;Therapeutic exercise;Patient/family education;Manual techniques;Passive range of motion;Vasopneumatic Device;Joint Manipulations    PT Next Visit Plan Continue with increasing overall AROM and encouraging multiple times/day HEP compliance.    PT Home Exercise Plan Signed   Access Code: [email or web address]  URL: [email or web address]  Date: 03/11/2021  Prepared by: Joann Campos     Exercises  Supine Scapular Protraction in Flexion with Dumbbells - 2-3 x daily - 7 x weekly - 1 sets - 20-30 reps - 3 seconds hold  Supine Shoulder Internal Rotation Stretch - 2-3 x daily - 7 x weekly - 1 sets - 20 reps - 5-10 seconds hold  Supine Shoulder External Rotation Stretch - 2-3 x daily - 7 x weekly - 1 sets - 20 reps - 5-10 seconds hold  Standing Scapular Retraction - 5 x daily - 7 x weekly - 1 sets - 5 reps - 5 second hold  Standing Shoulder Posterior Capsule Stretch - 2-3 x daily - 7 x weekly - 1 sets - 10 reps - 10 seconds hold. Added table stretch AAROM flexion, abd, ER 5sec hold X10 each Joann Ra)    Consulted and Agree with Plan of Care Patient             Patient will benefit from skilled therapeutic intervention in order to improve the following deficits and impairments:  Decreased activity tolerance, Decreased endurance, Decreased range of motion, Decreased strength, Hypomobility, Increased edema, Impaired perceived functional ability, Impaired  flexibility, Impaired UE functional use, Pain  Visit Diagnosis: Localized edema  Chronic right shoulder pain  Muscle weakness (generalized)  Stiffness of right shoulder, not elsewhere classified     Problem List There are no problems to display for this patient.   Joann Campos, PT, MPT 04/08/2021, 10:41 AM  [facility] Physical Therapy [address] [place], [place], [address] Phone: [phone]   Fax:  [phone]  Name: Joann Campos MRN: [id number] Date of Birth: 06-Sep-1955

## 2021-04-13 ENCOUNTER — Encounter: Payer: 59 | Admitting: Physical Therapy

## 2021-04-15 ENCOUNTER — Other Ambulatory Visit: Payer: Self-pay

## 2021-04-15 ENCOUNTER — Encounter: Payer: Self-pay | Admitting: Rehabilitative and Restorative Service Providers"

## 2021-04-15 ENCOUNTER — Ambulatory Visit (INDEPENDENT_AMBULATORY_CARE_PROVIDER_SITE_OTHER): Payer: 59 | Admitting: Rehabilitative and Restorative Service Providers"

## 2021-04-15 DIAGNOSIS — R600 Localized edema: Secondary | ICD-10-CM

## 2021-04-15 DIAGNOSIS — G8929 Other chronic pain: Secondary | ICD-10-CM

## 2021-04-15 DIAGNOSIS — M25611 Stiffness of right shoulder, not elsewhere classified: Secondary | ICD-10-CM

## 2021-04-15 DIAGNOSIS — M25511 Pain in right shoulder: Secondary | ICD-10-CM | POA: Diagnosis not present

## 2021-04-15 DIAGNOSIS — M6281 Muscle weakness (generalized): Secondary | ICD-10-CM

## 2021-04-15 NOTE — Patient Instructions (Signed)
Access Code: [email or web address] URL: [email or web address] Date: 04/15/2021 Prepared by: Vista Mink  Exercises Supine Scapular Protraction in Flexion with Dumbbells - 2-3 x daily - 7 x weekly - 1 sets - 20-30 reps - 3 seconds hold Supine Shoulder Internal Rotation Stretch - 2-3 x daily - 7 x weekly - 1 sets - 20 reps - 5-10 seconds hold Supine Shoulder External Rotation Stretch - 2-3 x daily - 7 x weekly - 1 sets - 20 reps - 5-10 seconds hold Standing Scapular Retraction - 5 x daily - 7 x weekly - 1 sets - 5 reps - 5 second hold Standing Shoulder Posterior Capsule Stretch - 2-3 x daily - 7 x weekly - 1 sets - 10 reps - 10 seconds hold Seated Shoulder Flexion AAROM with Pulley Behind - 2 x daily - 7 x weekly - 1 sets - 10 reps - 10 seconds hold Standing Shoulder Internal Rotation Stretch with Hands Behind Back - 2-3 x daily - 7 x weekly - 1 sets - 10 reps - 10 seconds hold

## 2021-04-15 NOTE — Therapy (Signed)
[facility] Therapy [address] [place], [place], [address] Phone: [phone]   Fax:  [phone]  Physical Therapy Treatment/Progress Note  Patient Details  Name: Joann Campos MRN: [id number] Date of Birth: [birth year] Referring Provider (PT): Meredith Pel MD  Progress Note Reporting Period 03/11/2021 to 04/15/2021  See note below for Objective Data and Assessment of Progress/Goals.     Encounter Date: 04/15/2021   PT End of Session - 04/15/21 0956     Visit Number 7    Number of Visits 24    Date for PT Re-Evaluation 06/03/21    Progress Note Due on Visit 17    PT Start Time 0800    PT Stop Time 0845    PT Time Calculation (min) 45 min    Activity Tolerance Patient tolerated treatment well;Patient limited by pain    Behavior During Therapy [facility] for tasks assessed/performed             Past Medical History:  Diagnosis Date   Hip pain 2021    Past Surgical History:  Procedure Laterality Date   CHOLECYSTECTOMY      There were no vitals filed for this visit.   Subjective Assessment - 04/15/21 0803     Subjective Joann Campos reports "fair" HEP compliance.  Sleep is relatively undisturbed by her shoulder pain.    Pertinent History Previous L shoulder adhesive capsulitis    Limitations Lifting;House hold activities    Patient Stated Goals Be able to use her R arm normally    Currently in Pain? No/denies    Pain Score 0-No pain    Pain Location Shoulder    Pain Orientation Right    Pain Descriptors / Indicators Tightness    Pain Type Chronic pain    Pain Radiating Towards Lateral R arm    Pain Onset More than a month ago    Pain Frequency Occasional    Aggravating Factors  End range AROM (overhead, behind the back)    Pain Relieving Factors Exercises and an occasional aspirin    Effect of Pain on Daily Activities All R UE function affected    Multiple Pain Sites No                [facility] PT Assessment - 04/15/21  0001       ROM / Strength   AROM / PROM / Strength AROM;Strength      AROM   Overall AROM  Deficits    AROM Assessment Site Shoulder    Right/Left Shoulder Left;Right    Right Shoulder Flexion 130 Degrees    Right Shoulder Internal Rotation 45 Degrees   supine at 70-80 degrees abduction   Right Shoulder External Rotation 45 Degrees   at 70-80 degrees abduction in supine   Right Shoulder Horizontal  ADduction 35 Degrees    Left Shoulder Flexion 165 Degrees    Left Shoulder Internal Rotation 55 Degrees    Left Shoulder External Rotation 75 Degrees    Left Shoulder Horizontal ADduction 50 Degrees      Strength   Right Shoulder Internal Rotation --   18.8 pounds   Right Shoulder External Rotation --   14.4 pounds   Left Shoulder Internal Rotation --   18.5 pounds   Left Shoulder External Rotation --   14.1 pounds                          [facility] Adult PT Treatment/Exercise - 04/15/21  0001       Posture/Postural Control   Posture/Postural Control Postural limitations    Postural Limitations Rounded Shoulders      Therapeutic Activites    Therapeutic Activities ADL's    ADL's Pulley for overhead function, supine arm raise for reaching and thumb up the back for overhead function      Exercises   Exercises Shoulder      Shoulder Exercises: Supine   Protraction AAROM;Right;20 reps;Limitations    Protraction Weight (lbs) 3#    Protraction Limitations Palm in and reach up to ceiling    External Rotation AROM;Right;10 reps;Limitations    External Rotation Limitations 10 seconds (at 50 degrees abduction, progress to 70-80 degrees abduction when ready)    Internal Rotation AROM;Right;10 reps;Limitations    Internal Rotation Limitations 10 seconds (at 50 degrees abduction, progress to 70-80 degrees abduction when ready)    Flexion AAROM;Right;10 reps;Limitations    Flexion Limitations 10 seconds palm in reach up 1st      Shoulder Exercises: Seated   Other Seated  Exercises Shoulder blade pinches 10X 5 seconds    Other Seated Exercises Posterior capsule stretch and thumb up the back stretch 10X 10 seconds each      Shoulder Exercises: Standing   External Rotation --   Standing ER door stretch 1 arm and chest stretch position 5X 10 seconds each   Internal Rotation --   Thumb up the back stretch 10X 10 seconds     Shoulder Exercises: Pulleys   Flexion Limitations 10X 10 seconds palm in and protract 1st    Scaption Limitations 10X 10 seconds palm in and protract 1st                     PT Education - 04/15/21 1710     Education Details Reviewed HEP with additions to help rotation and capsular flexibility.  Reviewed progress note findings and updated HEP.    Person(s) Educated Patient    Methods Explanation;Demonstration;Tactile cues;Verbal cues;Handout    Comprehension Verbalized understanding;Tactile cues required;Need further instruction;Returned demonstration;Verbal cues required              PT Short Term Goals - 04/15/21 1711       PT SHORT TERM GOAL #1   Title Improve R shoulder AROM for flexion to 135 degrees; ER to 45 degrees (at 70 degrees abduction); IR to 50 degrees (at 70 degrees abduction) and horizontal adduction to 40 degrees.    Baseline 135; 45; 45; 35 (was 90; 15; 20 and 15 with IR/ER at 30 degrees abduction)    Time 6    Period Weeks    Status Partially Met    Target Date 04/22/21               PT Long Term Goals - 04/15/21 1711       PT LONG TERM GOAL #1   Title Improve FOTO to 64.    Baseline 59 (was 49)    Time 12    Period Weeks    Status On-going    Target Date 06/03/21      PT LONG TERM GOAL #2   Title Improve R shoulder pain to consistently 0-2/10 on the Numeric Pain Rating Scale.    Baseline Can be 5-6/10    Time 12    Period Weeks    Status On-going    Target Date 06/03/21      PT LONG TERM GOAL #3   Title Improve  R shoulder AROM for flexion to 160; ER to 80; IR to 60 and  horizontal adduction to 40 degrees.    Baseline 90; 15; 20; 15    Time 12    Period Weeks    Status On-going    Target Date 06/03/21      PT LONG TERM GOAL #4   Title Improve R shoulder strength to 100% of the uninvolved L.    Baseline See objective    Time 12    Period Weeks    Status Achieved    Target Date 06/03/21      PT LONG TERM GOAL #5   Title Joann Campos will be independent with her long-term HEP at [place].    Baseline Updated today    Time 12    Period Weeks    Status On-going    Target Date 06/03/21                   Plan - 04/15/21 1714     Clinical Impression Statement Joann Campos is making significant progress with her capsular range, AROM, strength and self-reported function.  Remaining work will address remaining impairments in order to meet long-term goals.  AROM and capsular flexibility will be the emphasis of continued work.    Comorbidities Previous L frozen shoulder    Examination-Activity Limitations Reach Overhead;Dressing;Bed Mobility;Hygiene/Grooming;Sleep;Lift;Carry    Examination-Participation Restrictions Community Activity    Stability/Clinical Decision Making Stable/Uncomplicated    Rehab Potential Good    PT Frequency 2x / week    PT Duration 12 weeks    PT Treatment/Interventions ADLs/Self Care Home Management;Cryotherapy;Therapeutic activities;Neuromuscular re-education;Therapeutic exercise;Patient/family education;Manual techniques;Passive range of motion;Vasopneumatic Device;Joint Manipulations    PT Next Visit Plan Continue with increasing overall AROM and encouraging multiple times/day HEP compliance.    PT Home Exercise Plan Signed   Access Code: [email or web address]  URL: [email or web address]  Date: 03/11/2021  Prepared by: Pauletta Browns     Exercises  Supine Scapular Protraction in Flexion with Dumbbells - 2-3 x daily - 7 x weekly - 1 sets - 20-30 reps - 3 seconds hold  Supine Shoulder Internal Rotation Stretch - 2-3 x daily - 7 x  weekly - 1 sets - 20 reps - 5-10 seconds hold  Supine Shoulder External Rotation Stretch - 2-3 x daily - 7 x weekly - 1 sets - 20 reps - 5-10 seconds hold  Standing Scapular Retraction - 5 x daily - 7 x weekly - 1 sets - 5 reps - 5 second hold  Standing Shoulder Posterior Capsule Stretch - 2-3 x daily - 7 x weekly - 1 sets - 10 reps - 10 seconds hold. Added table stretch AAROM flexion, abd, ER 5sec hold X10 each Ivery Quale)    Consulted and Agree with Plan of Care Patient             Patient will benefit from skilled therapeutic intervention in order to improve the following deficits and impairments:  Decreased activity tolerance, Decreased endurance, Decreased range of motion, Decreased strength, Hypomobility, Increased edema, Impaired perceived functional ability, Impaired flexibility, Impaired UE functional use, Pain  Visit Diagnosis: Stiffness of right shoulder, not elsewhere classified  Chronic right shoulder pain  Localized edema  Muscle weakness (generalized)     Problem List There are no problems to display for this patient.   Cherlyn Cushing, PT, MPT 04/15/2021, 5:16 PM  [facility] Physical Therapy [address] [place], [place], [address] Phone: [phone]   Fax:  [phone]  Name: Joann Campos MRN: [id number] Date of Birth: [birth year]

## 2021-04-20 ENCOUNTER — Encounter: Payer: Self-pay | Admitting: Physical Therapy

## 2021-04-20 ENCOUNTER — Ambulatory Visit (INDEPENDENT_AMBULATORY_CARE_PROVIDER_SITE_OTHER): Payer: 59 | Admitting: Physical Therapy

## 2021-04-20 ENCOUNTER — Other Ambulatory Visit: Payer: Self-pay

## 2021-04-20 DIAGNOSIS — M25611 Stiffness of right shoulder, not elsewhere classified: Secondary | ICD-10-CM | POA: Diagnosis not present

## 2021-04-20 DIAGNOSIS — M6281 Muscle weakness (generalized): Secondary | ICD-10-CM

## 2021-04-20 DIAGNOSIS — M25511 Pain in right shoulder: Secondary | ICD-10-CM

## 2021-04-20 DIAGNOSIS — R600 Localized edema: Secondary | ICD-10-CM

## 2021-04-20 DIAGNOSIS — G8929 Other chronic pain: Secondary | ICD-10-CM

## 2021-04-20 NOTE — Therapy (Signed)
[facility] Physical Therapy [address] [place], [place], [address] Phone: [phone]   Fax:  [phone]  Physical Therapy Treatment  Patient Details  Name: Fay Bagg MRN: [id number] Date of Birth: [birth year] Referring Provider (PT): Meredith Pel MD   Encounter Date: 04/20/2021   PT End of Session - 04/20/21 0854     Visit Number 8    Number of Visits 24    Date for PT Re-Evaluation 06/03/21    Progress Note Due on Visit 17    PT Start Time 0800    PT Stop Time 0843    PT Time Calculation (min) 43 min    Activity Tolerance Patient tolerated treatment well;Patient limited by pain    Behavior During Therapy [facility] for tasks assessed/performed             Past Medical History:  Diagnosis Date   Hip pain 2021    Past Surgical History:  Procedure Laterality Date   CHOLECYSTECTOMY      There were no vitals filed for this visit.   Subjective Assessment - 04/20/21 0800     Subjective Pt. states that the shoulder is feeling good today and sleeping has been better.    Pertinent History Previous L shoulder adhesive capsulitis    Limitations Lifting;House hold activities    Patient Stated Goals Be able to use her R arm normally    Pain Onset More than a month ago                [facility] PT Assessment - 04/20/21 0001       Assessment   Medical Diagnosis R frozen shoulder    Referring Provider (PT) Meredith Pel MD    Hand Dominance Right    Prior Therapy L shoulder                           [facility] Adult PT Treatment/Exercise - 04/20/21 0001       Shoulder Exercises: Seated   Other Seated Exercises Posterior capsule stretch 10X 10 seconds each      Shoulder Exercises: Standing   External Rotation --   Standing ER door stretch 1 arm and chest stretch position 10 X 10 seconds each   Other Standing Exercises R UE only Pendulum circle exercises x 30 forward/ x 30 backward; 20 x side to side, 20 x front to  back w/ 2# weight    Other Standing Exercises 1# bar both UE flexion, Right UE ER 2 x 10 each, UE ranger abduction 10 sec. hold x 10, Standing at table rolling red theraball out into flexion stretch 2 x 10 5 sec. hold      Shoulder Exercises: Pulleys   Flexion Limitations 1 minute x 2, 3 sec. hold    Scaption Limitations 1 minute x 2, 3 sec. hold      Manual Therapy   Manual Therapy Passive ROM    Manual therapy comments Rt shoulder GH mobs grade 2-3 for  A-P, P-A and inferior, Rt shoulder PROM to tolerance all planes                       PT Short Term Goals - 04/15/21 1711       PT SHORT TERM GOAL #1   Title Improve R shoulder AROM for flexion to 135 degrees; ER to 45 degrees (at 70 degrees abduction); IR to 50 degrees (at 70 degrees abduction)  and horizontal adduction to 40 degrees.    Baseline 135; 45; 45; 35 (was 90; 15; 20 and 15 with IR/ER at 30 degrees abduction)    Time 6    Period Weeks    Status Partially Met    Target Date 04/22/21               PT Long Term Goals - 04/15/21 1711       PT LONG TERM GOAL #1   Title Improve FOTO to 64.    Baseline 59 (was 49)    Time 12    Period Weeks    Status On-going    Target Date 06/03/21      PT LONG TERM GOAL #2   Title Improve R shoulder pain to consistently 0-2/10 on the Numeric Pain Rating Scale.    Baseline Can be 5-6/10    Time 12    Period Weeks    Status On-going    Target Date 06/03/21      PT LONG TERM GOAL #3   Title Improve R shoulder AROM for flexion to 160; ER to 80; IR to 60 and horizontal adduction to 40 degrees.    Baseline 90; 15; 20; 15    Time 12    Period Weeks    Status On-going    Target Date 06/03/21      PT LONG TERM GOAL #4   Title Improve R shoulder strength to 100% of the uninvolved L.    Baseline See objective    Time 12    Period Weeks    Status Achieved    Target Date 06/03/21      PT LONG TERM GOAL #5   Title Nerissa will be independent with her  long-term HEP at [place].    Baseline Updated today    Time 12    Period Weeks    Status On-going    Target Date 06/03/21                   Plan - 04/20/21 0858     Clinical Impression Statement Today's session focused on increasing overall shoulder ROM with AAROM flexion, abduction and ER exercises.Pt. is showing improvements in ROM and function of the R shoulder, however there are still notable ROM deficits present. Continued PT will assist with addressing capsular range limitations.    Comorbidities Previous L frozen shoulder    Examination-Activity Limitations Reach Overhead;Dressing;Bed Mobility;Hygiene/Grooming;Sleep;Lift;Carry    Examination-Participation Restrictions Community Activity    Stability/Clinical Decision Making Stable/Uncomplicated    Rehab Potential Good    PT Frequency 2x / week    PT Duration 12 weeks    PT Treatment/Interventions ADLs/Self Care Home Management;Cryotherapy;Therapeutic activities;Neuromuscular re-education;Therapeutic exercise;Patient/family education;Manual techniques;Passive range of motion;Vasopneumatic Device;Joint Manipulations    PT Next Visit Plan Progress exercises to increase overall ROM    PT Home Exercise Plan Signed   Access Code: [email or web address]  URL: [email or web address]  Date: 03/11/2021  Prepared by: Vista Mink     Exercises  Supine Scapular Protraction in Flexion with Dumbbells - 2-3 x daily - 7 x weekly - 1 sets - 20-30 reps - 3 seconds hold  Supine Shoulder Internal Rotation Stretch - 2-3 x daily - 7 x weekly - 1 sets - 20 reps - 5-10 seconds hold  Supine Shoulder External Rotation Stretch - 2-3 x daily - 7 x weekly - 1 sets - 20 reps - 5-10 seconds hold  Standing Scapular Retraction - 5 x daily - 7  x weekly - 1 sets - 5 reps - 5 second hold  Standing Shoulder Posterior Capsule Stretch - 2-3 x daily - 7 x weekly - 1 sets - 10 reps - 10 seconds hold. Added table stretch AAROM flexion, abd, ER 5sec hold X10 each Elsie Ra)    Consulted and Agree with Plan of Care Patient             Patient will benefit from skilled therapeutic intervention in order to improve the following deficits and impairments:  Decreased activity tolerance, Decreased endurance, Decreased range of motion, Decreased strength, Hypomobility, Increased edema, Impaired perceived functional ability, Impaired flexibility, Impaired UE functional use, Pain  Visit Diagnosis: Stiffness of right shoulder, not elsewhere classified  Chronic right shoulder pain  Localized edema  Muscle weakness (generalized)     Problem List There are no problems to display for this patient.   Derek Laughter Singer, Student-PT 04/20/2021, 9:50 AM  [facility] Physical Therapy [address] [place], [place], [address] Phone: [phone]   Fax:  [phone]  Name: Alyssamae Klinck MRN: [id number] Date of Birth: 05-03-1955

## 2021-04-22 ENCOUNTER — Ambulatory Visit (INDEPENDENT_AMBULATORY_CARE_PROVIDER_SITE_OTHER): Payer: 59 | Admitting: Surgical

## 2021-04-22 ENCOUNTER — Ambulatory Visit (INDEPENDENT_AMBULATORY_CARE_PROVIDER_SITE_OTHER): Payer: 59 | Admitting: Rehabilitative and Restorative Service Providers"

## 2021-04-22 ENCOUNTER — Other Ambulatory Visit: Payer: Self-pay

## 2021-04-22 ENCOUNTER — Encounter: Payer: Self-pay | Admitting: Rehabilitative and Restorative Service Providers"

## 2021-04-22 DIAGNOSIS — M7541 Impingement syndrome of right shoulder: Secondary | ICD-10-CM

## 2021-04-22 DIAGNOSIS — R600 Localized edema: Secondary | ICD-10-CM

## 2021-04-22 DIAGNOSIS — M6281 Muscle weakness (generalized): Secondary | ICD-10-CM

## 2021-04-22 DIAGNOSIS — M7501 Adhesive capsulitis of right shoulder: Secondary | ICD-10-CM | POA: Diagnosis not present

## 2021-04-22 DIAGNOSIS — M25511 Pain in right shoulder: Secondary | ICD-10-CM

## 2021-04-22 DIAGNOSIS — M25611 Stiffness of right shoulder, not elsewhere classified: Secondary | ICD-10-CM | POA: Diagnosis not present

## 2021-04-22 DIAGNOSIS — G8929 Other chronic pain: Secondary | ICD-10-CM

## 2021-04-22 NOTE — Therapy (Signed)
[facility] Physical Therapy [address] [place], [place], [address] Phone: [phone]   Fax:  [phone]  Physical Therapy Treatment  Patient Details  Name: Joann Campos MRN: [id number] Date of Birth: Mar 14, 1956 Referring Provider (PT): Meredith Pel MD   Encounter Date: 04/22/2021   PT End of Session - 04/22/21 1715     Visit Number 9    Number of Visits 24    Date for PT Re-Evaluation 06/03/21    Progress Note Due on Visit 17    PT Start Time 0800    PT Stop Time 0845    PT Time Calculation (min) 45 min    Activity Tolerance Patient tolerated treatment well;Patient limited by pain    Behavior During Therapy [facility] for tasks assessed/performed             Past Medical History:  Diagnosis Date   Hip pain 2021    Past Surgical History:  Procedure Laterality Date   CHOLECYSTECTOMY      There were no vitals filed for this visit.   Subjective Assessment - 04/22/21 0807     Subjective Joann Campos notes she was able to put her hair in a ponytail today for the first time since starting PT.    Pertinent History Previous L shoulder adhesive capsulitis    Limitations Lifting;House hold activities    Patient Stated Goals Be able to use her R arm normally    Currently in Pain? No/denies    Pain Score 0-No pain    Pain Location Shoulder    Pain Orientation Right    Pain Descriptors / Indicators Tightness    Pain Type Chronic pain    Pain Radiating Towards Lateral R arm pain    Pain Onset More than a month ago    Pain Frequency Occasional    Aggravating Factors  End range AROM (overhead and behind the back)    Pain Relieving Factors Exercises and an occasional aspirin    Effect of Pain on Daily Activities All R UE function    Multiple Pain Sites No                [facility] PT Assessment - 04/22/21 0001       ROM / Strength   AROM / PROM / Strength AROM      AROM   Overall AROM  Deficits    AROM Assessment Site Shoulder     Right/Left Shoulder Right    Right Shoulder Flexion 135 Degrees    Right Shoulder Internal Rotation 50 Degrees   at 70-80 degrees abduction (was 20 at 30 degrees abduction)   Right Shoulder External Rotation 60 Degrees   at 70-80 degrees abduction (was 15 at 30 degrees abduction)   Right Shoulder Horizontal  ADduction 45 Degrees                           [facility] Adult PT Treatment/Exercise - 04/22/21 0001       Posture/Postural Control   Posture/Postural Control Postural limitations    Postural Limitations Rounded Shoulders      Therapeutic Activites    Therapeutic Activities ADL's    ADL's Pulley for overhead function, supine arm raise for reaching and thumb up the back for overhead function      Exercises   Exercises Shoulder      Shoulder Exercises: Supine   Protraction AAROM;Right;20 reps;Limitations    Protraction Weight (lbs) 4#    Protraction  Limitations Palm in and reach up to ceiling    External Rotation AROM;Right;10 reps;Limitations    External Rotation Limitations 10 seconds (at 50 degrees abduction, progress to 70-80 degrees abduction when ready)    Internal Rotation AROM;Right;10 reps;Limitations    Internal Rotation Limitations 10 seconds (at 50 degrees abduction, progress to 70-80 degrees abduction when ready)    Flexion AAROM;Right;10 reps;Limitations    Flexion Limitations 10 seconds palm in reach up 1st      Shoulder Exercises: Seated   Other Seated Exercises Shoulder blade pinches 10X 5 seconds    Other Seated Exercises Posterior capsule stretch and thumb up the back stretch 10X 10 seconds each      Shoulder Exercises: Standing   External Rotation --   Standing ER door stretch 1 arm and chest stretch position 10X 10 seconds each   Internal Rotation --   Thumb up the back stretch 10X 10 seconds     Shoulder Exercises: Pulleys   Flexion Limitations 10X 10 seconds palm in and protract 1st    Scaption Limitations 10X 10 seconds palm in and  protract 1st                     PT Education - 04/22/21 0844     Education Details Reviewed and corrected HEP.    Person(s) Educated Patient    Methods Explanation;Tactile cues;Verbal cues    Comprehension Returned demonstration;Verbal cues required;Verbalized understanding;Need further instruction;Tactile cues required              PT Short Term Goals - 04/22/21 0844       PT SHORT TERM GOAL #1   Title Improve R shoulder AROM for flexion to 135 degrees; ER to 45 degrees (at 70 degrees abduction); IR to 50 degrees (at 70 degrees abduction) and horizontal adduction to 40 degrees.    Baseline 135; 50; 70; 45 (was 90; 15; 20 and 15 with IR/ER at 30 degrees abduction)    Time 6    Period Weeks    Status Achieved    Target Date 04/22/21               PT Long Term Goals - 04/15/21 1711       PT LONG TERM GOAL #1   Title Improve FOTO to 64.    Baseline 59 (was 49)    Time 12    Period Weeks    Status On-going    Target Date 06/03/21      PT LONG TERM GOAL #2   Title Improve R shoulder pain to consistently 0-2/10 on the Numeric Pain Rating Scale.    Baseline Can be 5-6/10    Time 12    Period Weeks    Status On-going    Target Date 06/03/21      PT LONG TERM GOAL #3   Title Improve R shoulder AROM for flexion to 160; ER to 80; IR to 60 and horizontal adduction to 40 degrees.    Baseline 90; 15; 20; 15    Time 12    Period Weeks    Status On-going    Target Date 06/03/21      PT LONG TERM GOAL #4   Title Improve R shoulder strength to 100% of the uninvolved L.    Baseline See objective    Time 12    Period Weeks    Status Achieved    Target Date 06/03/21      PT LONG  TERM GOAL #5   Title Joann Campos will be independent with her long-term HEP at [place].    Baseline Updated today    Time 12    Period Weeks    Status On-going    Target Date 06/03/21                   Plan - 04/22/21 0845     Clinical Impression Statement Joann Campos  continues to make objective progress towards long-term goals.  She reports better compliance with activities she can do sitting or standing vs supine.  Reinforced importance of all home exercises while realizing it may be easier to do more of the seated and standing activities to compensate for less supine compliance.  Continue POC to meet LTGs.    Comorbidities Previous L frozen shoulder    Examination-Activity Limitations Reach Overhead;Dressing;Bed Mobility;Hygiene/Grooming;Sleep;Lift;Carry    Examination-Participation Restrictions Community Activity    Stability/Clinical Decision Making Stable/Uncomplicated    Rehab Potential Good    PT Frequency 2x / week    PT Duration 12 weeks    PT Treatment/Interventions ADLs/Self Care Home Management;Cryotherapy;Therapeutic activities;Neuromuscular re-education;Therapeutic exercise;Patient/family education;Manual techniques;Passive range of motion;Vasopneumatic Device;Joint Manipulations    PT Next Visit Plan Progress exercises to increase overall ROM    PT Home Exercise Plan Signed   Access Code: [email or web address]  URL: [email or web address]  Date: 03/11/2021  Prepared by: Vista Mink     Exercises  Supine Scapular Protraction in Flexion with Dumbbells - 2-3 x daily - 7 x weekly - 1 sets - 20-30 reps - 3 seconds hold  Supine Shoulder Internal Rotation Stretch - 2-3 x daily - 7 x weekly - 1 sets - 20 reps - 5-10 seconds hold  Supine Shoulder External Rotation Stretch - 2-3 x daily - 7 x weekly - 1 sets - 20 reps - 5-10 seconds hold  Standing Scapular Retraction - 5 x daily - 7 x weekly - 1 sets - 5 reps - 5 second hold  Standing Shoulder Posterior Capsule Stretch - 2-3 x daily - 7 x weekly - 1 sets - 10 reps - 10 seconds hold. Added table stretch AAROM flexion, abd, ER 5sec hold X10 each Elsie Ra)    Consulted and Agree with Plan of Care Patient             Patient will benefit from skilled therapeutic intervention in order to improve the  following deficits and impairments:  Decreased activity tolerance, Decreased endurance, Decreased range of motion, Decreased strength, Hypomobility, Increased edema, Impaired perceived functional ability, Impaired flexibility, Impaired UE functional use, Pain  Visit Diagnosis: Stiffness of right shoulder, not elsewhere classified  Chronic right shoulder pain  Localized edema  Muscle weakness (generalized)     Problem List There are no problems to display for this patient.   Farley Ly, PT, MPT 04/22/2021, 5:19 PM  [facility] Physical Therapy [address] [place], [place], [address] Phone: [phone]   Fax:  [phone]  Name: Joann Campos MRN: [id number] Date of Birth: 1955/06/29

## 2021-04-22 NOTE — Progress Notes (Signed)
Office Visit Note   Patient: Joann Campos           Date of Birth: 07-23-1955           MRN: [id number] Visit Date: 04/22/2021 Requested by: No referring provider defined for this encounter. PCP: Patient, No Pcp Per (Inactive)  Subjective: Chief Complaint  Patient presents with   Right Shoulder - Follow-up    HPI: Joann Campos is a 66 y.o. female who presents to the office complaining of right shoulder pain and stiffness.  She returns for repeat evaluation of right shoulder adhesive capsulitis following injection with Dr. Quintella Baton on 03/10/2021.  She states that this has provided has provided good relief and she feels about 50% better.  She still notes some pain with overhead motion and decreased mobility but she feels she is progressing well.  She is in physical therapy where they are mostly working on stretching exercises and using a shoulder pulley.  She is also fairly compliant with a home exercise program daily.  She does not feel like she has plateaued yet.  Taking ibuprofen as needed.  She has been able to place her hair in a ponytail which is progress for her.  She is very happy about this..                ROS: All systems reviewed are negative as they relate to the chief complaint within the history of present illness.  Patient denies fevers or chills.  Assessment & Plan: Visit Diagnoses:  1. Adhesive capsulitis of right shoulder     Plan: Patient is a 66 year old female who presents for reevaluation of right shoulder adhesive capsulitis.  She is doing very well and feels 50% better compared with last visit.  She has had good relief from intra-articular injection by Dr. Ernestina Patches.  Range of motion of the right shoulder is still somewhat stiff but significantly improved compared with last exam.  She does not feel like she has plateaued yet.  Plan to try another intra articular injection and continue with physical therapy and home exercise program.  Follow-up in 6 weeks for  clinical recheck.  Follow-Up Instructions: Return in about 6 weeks (around 06/03/2021).   Orders:  Orders Placed This Encounter  Procedures   Ambulatory referral to Physical Medicine Rehab   Ambulatory referral to Physical Therapy   No orders of the defined types were placed in this encounter.     Procedures: No procedures performed   Clinical Data: No additional findings.  Objective: Vital Signs: There were no vitals taken for this visit.  Physical Exam:  Constitutional: Patient appears well-developed HEENT:  Head: Normocephalic Eyes:EOM are normal Neck: Normal range of motion Cardiovascular: Normal rate Pulmonary/chest: Effort normal Neurologic: Patient is alert Skin: Skin is warm Psychiatric: Patient has normal mood and affect  Ortho Exam: Ortho exam demonstrates right shoulder with 20 degrees external rotation, 70 degrees abduction, 130 degrees forward flexion.  Excellent rotator cuff strength of supraspinatus, infraspinatus, subscapularis.  Active range of motion equivalent to passive range of motion.  No coarse grinding crepitus noted with passive motion of the shoulder.  Specialty Comments:  No specialty comments available.  Imaging: No results found.   PMFS History: There are no problems to display for this patient.  Past Medical History:  Diagnosis Date   Hip pain 2021    Family History  Family history unknown: Yes    Past Surgical History:  Procedure Laterality Date   CHOLECYSTECTOMY  Social History   Occupational History   Not on file  Tobacco Use   Smoking status: Former   Smokeless tobacco: Never  Substance and Sexual Activity   Alcohol use: Never   Drug use: Never   Sexual activity: Not on file

## 2021-04-22 NOTE — Patient Instructions (Signed)
Access Code: [email or web address] URL: [email or web address] Date: 04/22/2021 Prepared by: Vista Mink  Exercises Supine Scapular Protraction in Flexion with Dumbbells - 2-3 x daily - 7 x weekly - 1 sets - 20-30 reps - 3 seconds hold Supine Shoulder Internal Rotation Stretch - 2-3 x daily - 7 x weekly - 1 sets - 20 reps - 5-10 seconds hold Supine Shoulder External Rotation Stretch - 2-3 x daily - 7 x weekly - 1 sets - 20 reps - 5-10 seconds hold Standing Scapular Retraction - 5 x daily - 7 x weekly - 1 sets - 5 reps - 5 second hold Standing Shoulder Posterior Capsule Stretch - 2-3 x daily - 7 x weekly - 1 sets - 10 reps - 10 seconds hold Seated Shoulder Flexion AAROM with Pulley Behind - 2 x daily - 7 x weekly - 1 sets - 10 reps - 10 seconds hold Standing Shoulder Internal Rotation Stretch with Hands Behind Back - 2-3 x daily - 7 x weekly - 1 sets - 10 reps - 10 seconds hold

## 2021-04-23 ENCOUNTER — Encounter: Payer: Self-pay | Admitting: Orthopedic Surgery

## 2021-04-27 ENCOUNTER — Ambulatory Visit (INDEPENDENT_AMBULATORY_CARE_PROVIDER_SITE_OTHER): Payer: 59 | Admitting: Physical Therapy

## 2021-04-27 ENCOUNTER — Other Ambulatory Visit: Payer: Self-pay

## 2021-04-27 DIAGNOSIS — R600 Localized edema: Secondary | ICD-10-CM | POA: Diagnosis not present

## 2021-04-27 DIAGNOSIS — M25511 Pain in right shoulder: Secondary | ICD-10-CM

## 2021-04-27 DIAGNOSIS — M6281 Muscle weakness (generalized): Secondary | ICD-10-CM | POA: Diagnosis not present

## 2021-04-27 DIAGNOSIS — M25611 Stiffness of right shoulder, not elsewhere classified: Secondary | ICD-10-CM | POA: Diagnosis not present

## 2021-04-27 DIAGNOSIS — G8929 Other chronic pain: Secondary | ICD-10-CM

## 2021-04-27 NOTE — Therapy (Addendum)
[facility] Physical Therapy [address] [place], [place], [address] Phone: [phone]   Fax:  [phone]  Physical Therapy Treatment/Discharge  Patient Details  Name: Joann Campos MRN: [id number] Date of Birth: [birth year] Referring Provider (PT): Meredith Pel MD  PHYSICAL THERAPY DISCHARGE SUMMARY  Visits from Start of Care: 10  Current functional level related to goals / functional outcomes: See note   Remaining deficits: See note   Education / Equipment: HEP   Patient agrees to discharge. Patient goals were partially met. Patient is being discharged due to not returning since the last visit.   Encounter Date: 04/27/2021   PT End of Session - 04/27/21 0850     Visit Number 10    Number of Visits 24    Date for PT Re-Evaluation 06/03/21    Progress Note Due on Visit 17    PT Start Time 0805    PT Stop Time 0845    PT Time Calculation (min) 40 min    Activity Tolerance Patient tolerated treatment well    Behavior During Therapy [facility] for tasks assessed/performed             Past Medical History:  Diagnosis Date   Hip pain 2021    Past Surgical History:  Procedure Laterality Date   CHOLECYSTECTOMY      There were no vitals filed for this visit.   Subjective Assessment - 04/27/21 0828     Subjective She states her Rt shoulder is feeling overall better, her pain levels are "good" today.    Pertinent History Previous L shoulder adhesive capsulitis    Limitations Lifting;House hold activities    Patient Stated Goals Be able to use her R arm normally    Pain Onset More than a month ago                               [facility] Adult PT Treatment/Exercise - 04/27/21 0001       Shoulder Exercises: Standing   External Rotation Right;20 reps    Theraband Level (Shoulder External Rotation) Level 2 (Red)    Internal Rotation Right;20 reps    Theraband Level (Shoulder Internal Rotation) Level 2 (Red)     Extension 20 reps    Theraband Level (Shoulder Extension) Level 2 (Red)    Row 20 reps    Theraband Level (Shoulder Row) Level 2 (Red)    Other Standing Exercises wall ladder flexion and abd 5 sec X10 ea, lifting 2# Overhead into 2nd shelf top cabinet 2X10    Other Standing Exercises ER stretch in door 10 sec x10, IR stretch behind back with strap 5 sec X10      Shoulder Exercises: Pulleys   Flexion 2 minutes    ABduction 2 minutes      Shoulder Exercises: ROM/Strengthening   UBE (Upper Arm Bike) L3 for 2 min fwd, 2 min retro      Manual Therapy   Manual therapy comments Rt shoulder GH mobs grade 2-3 for  A-P, P-A and inferior, Rt shoulder PROM to tolerance all planes                       PT Short Term Goals - 04/22/21 0844       PT SHORT TERM GOAL #1   Title Improve R shoulder AROM for flexion to 135 degrees; ER to 45 degrees (at 70 degrees abduction); IR to 50  degrees (at 70 degrees abduction) and horizontal adduction to 40 degrees.    Baseline 135; 50; 70; 45 (was 90; 15; 20 and 15 with IR/ER at 30 degrees abduction)    Time 6    Period Weeks    Status Achieved    Target Date 04/22/21               PT Long Term Goals - 04/15/21 1711       PT LONG TERM GOAL #1   Title Improve FOTO to 64.    Baseline 59 (was 49)    Time 12    Period Weeks    Status On-going    Target Date 06/03/21      PT LONG TERM GOAL #2   Title Improve R shoulder pain to consistently 0-2/10 on the Numeric Pain Rating Scale.    Baseline Can be 5-6/10    Time 12    Period Weeks    Status On-going    Target Date 06/03/21      PT LONG TERM GOAL #3   Title Improve R shoulder AROM for flexion to 160; ER to 80; IR to 60 and horizontal adduction to 40 degrees.    Baseline 90; 15; 20; 15    Time 12    Period Weeks    Status On-going    Target Date 06/03/21      PT LONG TERM GOAL #4   Title Improve R shoulder strength to 100% of the uninvolved L.    Baseline See objective     Time 12    Period Weeks    Status Achieved    Target Date 06/03/21      PT LONG TERM GOAL #5   Title Aaralynn will be independent with her long-term HEP at [place].    Baseline Updated today    Time 12    Period Weeks    Status On-going    Target Date 06/03/21                   Plan - 04/27/21 0851     Clinical Impression Statement ROM was still the emphasis of our session however now that she has made great progress with this we introduced a little more strengthening today with good overall tolerance within session. We will monitor for any sorness next visit.    Comorbidities Previous L frozen shoulder    Examination-Activity Limitations Reach Overhead;Dressing;Bed Mobility;Hygiene/Grooming;Sleep;Lift;Carry    Examination-Participation Restrictions Community Activity    Stability/Clinical Decision Making Stable/Uncomplicated    Rehab Potential Good    PT Frequency 2x / week    PT Duration 12 weeks    PT Treatment/Interventions ADLs/Self Care Home Management;Cryotherapy;Therapeutic activities;Neuromuscular re-education;Therapeutic exercise;Patient/family education;Manual techniques;Passive range of motion;Vasopneumatic Device;Joint Manipulations    PT Next Visit Plan ROM emphasis and light strengthening as able.    PT Home Exercise Plan Signed   Access Code: [email or web address]  URL: [email or web address]  Date: 03/11/2021  Prepared by: Vista Mink     Exercises  Supine Scapular Protraction in Flexion with Dumbbells - 2-3 x daily - 7 x weekly - 1 sets - 20-30 reps - 3 seconds hold  Supine Shoulder Internal Rotation Stretch - 2-3 x daily - 7 x weekly - 1 sets - 20 reps - 5-10 seconds hold  Supine Shoulder External Rotation Stretch - 2-3 x daily - 7 x weekly - 1 sets - 20 reps - 5-10 seconds hold  Standing Scapular Retraction - 5 x daily -  7 x weekly - 1 sets - 5 reps - 5 second hold  Standing Shoulder Posterior Capsule Stretch - 2-3 x daily - 7 x weekly - 1 sets - 10 reps - 10  seconds hold. Added table stretch AAROM flexion, abd, ER 5sec hold X10 each Elsie Ra)    Consulted and Agree with Plan of Care Patient             Patient will benefit from skilled therapeutic intervention in order to improve the following deficits and impairments:  Decreased activity tolerance, Decreased endurance, Decreased range of motion, Decreased strength, Hypomobility, Increased edema, Impaired perceived functional ability, Impaired flexibility, Impaired UE functional use, Pain  Visit Diagnosis: Stiffness of right shoulder, not elsewhere classified  Chronic right shoulder pain  Localized edema  Muscle weakness (generalized)     Problem List There are no problems to display for this patient.   Debbe Odea, PT,DPT 04/27/2021, 8:54 AM  Farley Ly PT, MPT  [facility] Physical Therapy [address] [place], [place], [address] Phone: [phone]   Fax:  [phone]  Name: Joann Campos MRN: [id number] Date of Birth: 1955/07/05

## 2021-04-28 ENCOUNTER — Telehealth: Payer: Self-pay | Admitting: Physical Medicine and Rehabilitation

## 2021-04-28 NOTE — Telephone Encounter (Signed)
Patient called. She would like an appointment with Dr. Ernestina Patches. Her call back number is [phone]

## 2021-04-29 ENCOUNTER — Encounter: Payer: 59 | Admitting: Rehabilitative and Restorative Service Providers"

## 2021-05-03 ENCOUNTER — Ambulatory Visit (INDEPENDENT_AMBULATORY_CARE_PROVIDER_SITE_OTHER): Payer: 59 | Admitting: Physical Medicine and Rehabilitation

## 2021-05-03 ENCOUNTER — Ambulatory Visit: Payer: Self-pay

## 2021-05-03 ENCOUNTER — Encounter: Payer: Self-pay | Admitting: Physical Medicine and Rehabilitation

## 2021-05-03 ENCOUNTER — Other Ambulatory Visit: Payer: Self-pay

## 2021-05-03 DIAGNOSIS — M25511 Pain in right shoulder: Secondary | ICD-10-CM | POA: Diagnosis not present

## 2021-05-03 DIAGNOSIS — G8929 Other chronic pain: Secondary | ICD-10-CM

## 2021-05-03 NOTE — Progress Notes (Signed)
° °  Joann Campos - 66 y.o. female MRN [id number]  Date of birth: [birth year]  Office Visit Note: Visit Date: 05/03/2021 PCP: Patient, No Pcp Per (Inactive) Referred by: No ref. provider found  Subjective: Chief Complaint  Patient presents with   Right Shoulder - Pain   HPI:  Joann Campos is a 66 y.o. female who comes in today at the request of Dr. Anderson Malta for planned Right anesthetic glenohumeral arthrogram with fluoroscopic guidance.  The patient has failed conservative care including home exercise, medications, time and activity modification.  This injection will be diagnostic and hopefully therapeutic.  Please see requesting physician notes for further details and justification.   ROS Otherwise per HPI.  Assessment & Plan: Visit Diagnoses:    ICD-10-CM   1. Chronic right shoulder pain  M25.511 XR C-ARM NO REPORT   G89.29       Plan: No additional findings.   Meds & Orders: No orders of the defined types were placed in this encounter.   Orders Placed This Encounter  Procedures   Large Joint Inj   XR C-ARM NO REPORT    Follow-up: Return for visit to requesting provider as needed.   Procedures: Large Joint Inj: R glenohumeral on 05/03/2021 8:30 AM Indications: pain and diagnostic evaluation Details: 22 G 3.5 in needle, fluoroscopy-guided anteromedial approach  Arthrogram: No  Medications: 40 mg triamcinolone acetonide 40 MG/ML; 5 mL bupivacaine 0.25 % Outcome: tolerated well, no immediate complications  There was excellent flow of contrast producing a partial arthrogram of the glenohumeral joint. The patient did have relief of symptoms during the anesthetic phase of the injection. Procedure, treatment alternatives, risks and benefits explained, specific risks discussed. Consent was given by the patient. Immediately prior to procedure a time out was called to verify the correct patient, procedure, equipment, support staff and site/side marked as required.  Patient was prepped and draped in the usual sterile fashion.         Clinical History: No specialty comments available.     Objective:  VS:  HT:     WT:    BMI:      BP:    HR: bpm   TEMP: ( )   RESP:  Physical Exam   Imaging: No results found.

## 2021-05-03 NOTE — Progress Notes (Signed)
Pt state right shoulder pain. Pt state any movement with her right arm cause the pain worse to get worse. Pt state she takes over the counter pain meds to help ease her pain.  Numeric Pain Rating Scale and Functional Assessment Average Pain 3   In the last MONTH (on 0-10 scale) has pain interfered with the following?  1. General activity like being  able to carry out your everyday physical activities such as walking, climbing stairs, carrying groceries, or moving a chair?  Rating(9)   -BT, -Dye Allergies.

## 2021-05-05 MED ORDER — BUPIVACAINE HCL 0.25 % IJ SOLN
5.0000 mL | INTRAMUSCULAR | Status: AC | PRN
  Administered 2021-05-03: 5 mL via INTRA_ARTICULAR

## 2021-05-05 MED ORDER — TRIAMCINOLONE ACETONIDE 40 MG/ML IJ SUSP
40.0000 mg | INTRAMUSCULAR | Status: AC | PRN
  Administered 2021-05-03: 40 mg via INTRA_ARTICULAR

## 2021-08-19 ENCOUNTER — Encounter: Payer: Self-pay | Admitting: Surgical

## 2021-08-19 ENCOUNTER — Ambulatory Visit (INDEPENDENT_AMBULATORY_CARE_PROVIDER_SITE_OTHER): Payer: 59 | Admitting: Surgical

## 2021-08-19 DIAGNOSIS — M25511 Pain in right shoulder: Secondary | ICD-10-CM | POA: Diagnosis not present

## 2021-08-19 DIAGNOSIS — G8929 Other chronic pain: Secondary | ICD-10-CM | POA: Diagnosis not present

## 2021-08-19 DIAGNOSIS — M7501 Adhesive capsulitis of right shoulder: Secondary | ICD-10-CM

## 2021-08-19 MED ORDER — BUPIVACAINE HCL 0.5 % IJ SOLN
9.0000 mL | INTRAMUSCULAR | Status: AC | PRN
  Administered 2021-08-19: 9 mL via INTRA_ARTICULAR

## 2021-08-19 MED ORDER — LIDOCAINE HCL 1 % IJ SOLN
5.0000 mL | INTRAMUSCULAR | Status: AC | PRN
  Administered 2021-08-19: 5 mL

## 2021-08-19 NOTE — Progress Notes (Signed)
Office Visit Note   Patient: Joann Campos           Date of Birth: 10-Dec-1955           MRN: [id number] Visit Date: 08/19/2021 Requested by: No referring provider defined for this encounter. PCP: Patient, No Pcp Per (Inactive)  Subjective: Chief Complaint  Patient presents with   Right Shoulder - Pain    HPI: Joann Campos is a 66 y.o. female who presents to the office complaining of right shoulder pain.  Patient has history of right shoulder adhesive capsulitis and she has had multiple injections for this problem with last injection on 05/03/2021.  She was doing pretty well following that injection until about 2 weeks ago when pain began to bother her again.  She has not really been keeping up with home exercise program and has stopped physical therapy so she has continued stiffness in the right shoulder as well.  The pain is currently keeping her up at night and causing her pain even at rest.  She does not feel ill.  Last injection did very well for her.  She denies any subjective weakness in the arm..                ROS: All systems reviewed are negative as they relate to the chief complaint within the history of present illness.  Patient denies fevers or chills.  Assessment & Plan: Visit Diagnoses:  1. Chronic right shoulder pain   2. Adhesive capsulitis of right shoulder     Plan: Impression is right shoulder adhesive capsulitis which has not improved since last appointment on 04/22/2021 and in fact her range of motion is reduced compared with that appointment.  She also has new rotator cuff weakness on exam today that has not been present on past exams.  Could be that the weakness is due to pain but with this acute change in her exam and no history of injury as well as the failure of resolution of the adhesive capsulitis, plan for further evaluation of any other right shoulder pathology such as rotator cuff tear with MRI arthrogram of the right shoulder.  Toradol injection was  administered in the right glenohumeral joint today.  Patient tolerated the procedure well.  She has had 2 to 3 injections in the glenohumeral joint so no other cortisone injections can really be administered at this point.  She may need to consider just continue with home exercise program or manipulation under anesthesia if the MRI does not show any other pathology.  Follow-up after MRI.  Follow-Up Instructions: No follow-ups on file.   Orders:  Orders Placed This Encounter  Procedures   MR SHOULDER RIGHT W CONTRAST   Arthrogram   No orders of the defined types were placed in this encounter.     Procedures: Large Joint Inj: R glenohumeral on 08/19/2021 5:24 PM Indications: diagnostic evaluation and pain Details: 18 G 1.5 in needle, posterior approach  Arthrogram: No  Medications: 9 mL bupivacaine 0.5 %; 5 mL lidocaine 1 % (Bupivacaine mixed with 1 cc of Toradol) Outcome: tolerated well, no immediate complications Procedure, treatment alternatives, risks and benefits explained, specific risks discussed. Consent was given by the patient. Immediately prior to procedure a time out was called to verify the correct patient, procedure, equipment, support staff and site/side marked as required. Patient was prepped and draped in the usual sterile fashion.      Clinical Data: No additional findings.  Objective: Vital Signs: There were no  vitals taken for this visit.  Physical Exam:  Constitutional: Patient appears well-developed HEENT:  Head: Normocephalic Eyes:EOM are normal Neck: Normal range of motion Cardiovascular: Normal rate Pulmonary/chest: Effort normal Neurologic: Patient is alert Skin: Skin is warm Psychiatric: Patient has normal mood and affect  Ortho Exam: Ortho exam demonstrates right shoulder with 25 degrees external rotation, 50 degrees abduction, 105 degrees forward flexion.  This compared with the left shoulder with 50 degrees external rotation, 70 degrees  abduction, 160 degrees forward flexion.  No increased warmth in the right shoulder compared with the left.  No cellulitis or skin changes noted.  She has excellent strength of grip strength, finger abduction, pronation/supination, bicep, tricep rated 5/5 in both shoulders.  She does have 4/5 external rotation and supraspinatus strength of the right shoulder relative to the left which is rated 5/5.  She also has reproduction of shoulder pain with subscapularis strength testing with 5 -/5 strength.  No tenderness over the [facility] joint.  She does have moderate tenderness over the bicipital groove.  No tenderness over the cervical spine.  Specialty Comments:  No specialty comments available.  Imaging: No results found.   PMFS History: There are no problems to display for this patient.  Past Medical History:  Diagnosis Date   Hip pain 2021    Family History  Family history unknown: Yes    Past Surgical History:  Procedure Laterality Date   CHOLECYSTECTOMY     Social History   Occupational History   Not on file  Tobacco Use   Smoking status: Former   Smokeless tobacco: Never  Substance and Sexual Activity   Alcohol use: Never   Drug use: Never   Sexual activity: Not on file

## 2021-08-20 ENCOUNTER — Telehealth: Payer: Self-pay | Admitting: Surgical

## 2021-08-20 NOTE — Telephone Encounter (Signed)
Called Patient for MRI review appt to be set and Patient stated she spoke with [facility] yesterday and he said he would put in a Rx order for Valium to be ready before her MRI patient just wanted to make sure that gets done before the 16th.

## 2021-08-23 ENCOUNTER — Other Ambulatory Visit: Payer: Self-pay | Admitting: Surgical

## 2021-08-23 MED ORDER — DIAZEPAM 5 MG PO TABS: 5.0000 mg | ORAL_TABLET | Freq: Once | ORAL | 0 refills | Status: AC

## 2021-08-23 NOTE — Telephone Encounter (Signed)
Sent in RX of Valium

## 2021-09-01 ENCOUNTER — Telehealth: Payer: Self-pay | Admitting: Surgical

## 2021-09-01 ENCOUNTER — Other Ambulatory Visit: Payer: Self-pay | Admitting: Surgical

## 2021-09-01 MED ORDER — DIAZEPAM 5 MG PO TABS: ORAL_TABLET | ORAL | 0 refills | Status: DC

## 2021-09-01 NOTE — Telephone Encounter (Signed)
Patient called advised she is having an MRI on Friday and will need a Rx for Valium called into her pharmacy. Patient uses [facility] on [address]. The number to contact patient is [phone]

## 2021-09-01 NOTE — Telephone Encounter (Signed)
IC advised submitted.  

## 2021-09-01 NOTE — Telephone Encounter (Signed)
I sent in this prescription the other day.  Not sure why its not showing up but I resubmitted it today.  Let me know if there is any problems with her obtaining it and I can try and get it figured out.

## 2021-09-03 ENCOUNTER — Ambulatory Visit
Admission: RE | Admit: 2021-09-03 | Discharge: 2021-09-03 | Disposition: A | Discharge status: Home / Self Care | Payer: 59 | Source: Ambulatory Visit | Attending: Surgical | Admitting: Surgical

## 2021-09-03 DIAGNOSIS — G8929 Other chronic pain: Secondary | ICD-10-CM

## 2021-09-03 MED ORDER — IOPAMIDOL (ISOVUE-M 200) INJECTION 41%
17.0000 mL | Freq: Once | INTRAMUSCULAR | Status: AC
  Administered 2021-09-03: 17 mL via INTRA_ARTICULAR

## 2021-09-10 ENCOUNTER — Ambulatory Visit (INDEPENDENT_AMBULATORY_CARE_PROVIDER_SITE_OTHER): Payer: 59 | Admitting: Surgical

## 2021-09-10 DIAGNOSIS — M7501 Adhesive capsulitis of right shoulder: Secondary | ICD-10-CM

## 2021-09-12 ENCOUNTER — Encounter: Payer: Self-pay | Admitting: Surgical

## 2021-09-12 NOTE — Progress Notes (Signed)
Office Visit Note   Patient: Joann Campos           Date of Birth: [birth year]           MRN: [id number] Visit Date: 09/10/2021 Requested by: No referring provider defined for this encounter. PCP: Patient, No Pcp Per  Subjective: Chief Complaint  Patient presents with   Other    Scan review    HPI: Joann Campos is a 66 y.o. female who presents to the office for MRI review. Patient denies any changes in symptoms.  Continues to complain mainly of right shoulder pain and stiffness.  She has pain at rest and pain that wakes her up at night.  She has had multiple cortisone injections for frozen shoulder and has had formal physical therapy and then home exercises without improvement of her symptoms.  She states that she does not really have the ability to hold herself accountable and keep up with regular therapy exercises for her shoulder.  MRI results revealed: MR SHOULDER RIGHT W CONTRAST  Result Date: 09/06/2021 CLINICAL DATA:  Evaluate for rotator cuff tear. Right shoulder pain for 1 year. EXAM: MRI OF THE RIGHT SHOULDER WITH CONTRAST TECHNIQUE: Multiplanar, multisequence MR imaging of the right shoulder was performed following the administration of intra-articular contrast. CONTRAST:  See Injection Documentation. COMPARISON:  Right shoulder radiographs 12/04/2020 FINDINGS: Rotator cuff: There is mild intermediate T2 signal tendinosis of the articular side of the supraspinatus tendon diffusely (coronal series 7 images 6 through 9). The infraspinatus is intact. The subscapularis and teres minor are intact. Muscles: No rotator cuff muscle atrophy, fatty infiltration, or edema. Biceps long head: Mild intermediate T2 signal proximal long head of the biceps tendinosis with non fluid-bright but linear possible interstitial tear (coronal image 9 and sagittal image 10). Acromioclavicular Joint: Normal alignment of the acromioclavicular joint with only minimal joint space narrowing and peripheral  osteophytosis. Mild distal lateral broad-based subacromial spurring. Mild lateral downsloping of the acromion on coronal images. Type 2 acromion. There is mild gadolinium contrast seen throughout the subacromial/subdeltoid bursa. This is not appreciated on the initial fluoroscopic image after arthrogram injection prior to this MRI. No definitive full-thickness rotator cuff tear is seen. This gadolinium contrast may have entered the subacromial/subdeltoid bursa iatrogenically during needle placement. Alternatively, there may be a thin full-thickness fenestration within a portion of the presumably superior subscapularis or anterior supraspinatus tendon insertion, however this is not definitely visualized. Glenohumeral Joint: Moderate thinning of the far inferior medial aspect of the humeral head cartilage. Labrum: Grossly intact, but evaluation is limited by lack of intraarticular fluid. Bones: Minimal inferior humeral head-neck junction degenerative osteophytosis. Other: There is a decreased T1 and decreased T2 signal focus measuring up to 3 mm within the non dependent anteromedial aspect of the biceps tendon sheath (axial image 12 and coronal image 12). This appears to demonstrate some blooming artifact may represent a small amount of a congenitally injected air. This also could represent a small loose body. IMPRESSION: 1. Mild articular sided tendinosis of the majority of the supraspinatus tendon insertion. 2. No definite full-thickness rotator cuff tear is seen, although there is mild gadolinium contrast within the subacromial/subdeltoid bursa. This gadolinium contrast may have entered the subacromial/subdeltoid bursa iatrogenically during needle placement. Alternatively, there may be a thin full-thickness fenestration within a portion of the presumably superior subscapularis or anterior supraspinatus tendon insertion, however this is not definitely visualized. 3. Mild glenohumeral cartilage degenerative changes.  4. No labral tear is seen. 5. Proximal long  head of the biceps linear interstitial tear. No retraction. 6. Possible small focus of iatrogenically introduced air within the biceps tendon sheath. This also could represent a small loose body. Electronically Signed   By: Yvonne Kendall M.D.   On: 09/06/2021 08:27                 ROS: All systems reviewed are negative as they relate to the chief complaint within the history of present illness.  Patient denies fevers or chills.  Assessment & Plan: Visit Diagnoses:  1. Adhesive capsulitis of right shoulder     Plan: Joann Campos is a 66 y.o. female who presents to the office for review of right shoulder MRI.  MRI was obtained with some rotator cuff weakness she demonstrated on last exam.  She has excellent strength on today's exam but continues to complain primarily of pain and stiffness in the right shoulder consistent with adhesive capsulitis.  MRI was reviewed which reveals no definitive full-thickness rotator cuff tear, split tear of the bicep tendon, right shoulder adhesive capsulitis.  Discussed the options available to patient such as continued physical therapy, home exercise program, manipulation under anesthesia.  After discussion of options, patient does not want to proceed with any of these as she feels she will not really be able to keep up with the exercises and maintain any motion that she receives from the manipulation.  She really just wants to have another cortisone injection which has provided temporary relief for her in the past. She will be set up to see Dr Ernestina Patches who has administered [place] injections in the past which have given her relief. This should be the last injection she can receive for this problem.   Follow-Up Instructions: No follow-ups on file.   Orders:  No orders of the defined types were placed in this encounter.  No orders of the defined types were placed in this encounter.     Procedures: No procedures  performed   Clinical Data: No additional findings.  Objective: Vital Signs: There were no vitals taken for this visit.  Physical Exam:  Constitutional: Patient appears well-developed HEENT:  Head: Normocephalic Eyes:EOM are normal Neck: Normal range of motion Cardiovascular: Normal rate Pulmonary/chest: Effort normal Neurologic: Patient is alert Skin: Skin is warm Psychiatric: Patient has normal mood and affect  Ortho Exam: Ortho exam demonstrates right shoulder with 20 degrees external rotation, 55 degrees abduction, 90 degrees forward flexion with moderate pain with passive motion of the shoulder.  Moderate tenderness over the bicipital groove.  No tenderness of the AC joint.  Excellent rotator cuff strength of supra, infra, subscap.  Specialty Comments:  No specialty comments available.  Imaging: No results found.   PMFS History: There are no problems to display for this patient.  Past Medical History:  Diagnosis Date   Hip pain 2021    Family History  Family history unknown: Yes    Past Surgical History:  Procedure Laterality Date   CHOLECYSTECTOMY     Social History   Occupational History   Not on file  Tobacco Use   Smoking status: Former   Smokeless tobacco: Never  Substance and Sexual Activity   Alcohol use: Never   Drug use: Never   Sexual activity: Not on file

## 2021-09-18 DIAGNOSIS — E119 Type 2 diabetes mellitus without complications: Secondary | ICD-10-CM

## 2021-09-18 HISTORY — DX: Type 2 diabetes mellitus without complications: E11.9

## 2021-09-22 NOTE — Progress Notes (Signed)
Cloretta Ned, can you get Joann Campos set up for her right shoulder glenohumeral joint injection with Dr. Ernestina Patches?  She prefers that he does it, she has had good relief with injections with him in the past.  This should really be her last glenohumeral injection for this problem.

## 2021-09-23 ENCOUNTER — Other Ambulatory Visit: Payer: Self-pay

## 2021-09-23 DIAGNOSIS — M7501 Adhesive capsulitis of right shoulder: Secondary | ICD-10-CM

## 2021-09-29 ENCOUNTER — Ambulatory Visit: Payer: 59 | Admitting: Student

## 2021-09-29 ENCOUNTER — Encounter: Payer: Self-pay | Admitting: Student

## 2021-09-29 VITALS — BP 104/73 | HR 117 | Temp 99.3°F | Ht 65.0 in | Wt 128.8 lb

## 2021-09-29 DIAGNOSIS — Z8669 Personal history of other diseases of the nervous system and sense organs: Secondary | ICD-10-CM | POA: Insufficient documentation

## 2021-09-29 DIAGNOSIS — M7500 Adhesive capsulitis of unspecified shoulder: Secondary | ICD-10-CM | POA: Insufficient documentation

## 2021-09-29 DIAGNOSIS — Z7984 Long term (current) use of oral hypoglycemic drugs: Secondary | ICD-10-CM

## 2021-09-29 DIAGNOSIS — M7501 Adhesive capsulitis of right shoulder: Secondary | ICD-10-CM

## 2021-09-29 DIAGNOSIS — E119 Type 2 diabetes mellitus without complications: Secondary | ICD-10-CM

## 2021-09-29 DIAGNOSIS — G6289 Other specified polyneuropathies: Secondary | ICD-10-CM

## 2021-09-29 DIAGNOSIS — Z0000 Encounter for general adult medical examination without abnormal findings: Secondary | ICD-10-CM | POA: Insufficient documentation

## 2021-09-29 DIAGNOSIS — E782 Mixed hyperlipidemia: Secondary | ICD-10-CM | POA: Diagnosis not present

## 2021-09-29 DIAGNOSIS — E1140 Type 2 diabetes mellitus with diabetic neuropathy, unspecified: Secondary | ICD-10-CM | POA: Insufficient documentation

## 2021-09-29 DIAGNOSIS — E1142 Type 2 diabetes mellitus with diabetic polyneuropathy: Secondary | ICD-10-CM

## 2021-09-29 DIAGNOSIS — R200 Anesthesia of skin: Secondary | ICD-10-CM

## 2021-09-29 NOTE — Progress Notes (Addendum)
CC: Numbness and tingling to feet  HPI:  [name] is a 66 y.o. female who presents to the internal medicine center for numbness and tingling to her feet.  She states that she has never seen a primary care provider.  And wants to establish care here as well.  Patient states that this numbness and tingling has been going on for couple of months.  She states that it comes and goes and does not happen at certain times.  Patient states that she might have diabetes.  She denies any polydipsia or polyuria.  Please see assessment and plan for full HPI.  History: Medical: Iritis and frozen shoulder Social: Patient reports having a 5-year history of smoking cigarettes of 1 pack/day 35 years ago.  She no longer smokes.  Patient reports rare alcohol use.  Patient denies any drug use. Family: patient reports having 2 brothers each with strokes.  Patient's brother also has diabetes. Surgical: Patient has a history of a cholecystectomy and a breast reduction surgery  Past Medical History:  Diagnosis Date   Hip pain 2021    No current outpatient medications on file.  Review of Systems:  Endocrine: Patient denies any polyuria or polydipsia Neuro: Patient endorses numbness and tingling to her feet   Physical Exam:  Vitals:   09/29/21 1111  BP: 104/73  Pulse: (!) 117  Temp: 99.3 F (37.4 C)  TempSrc: Oral  SpO2: 98%  Weight: 128 lb 12.8 oz (58.4 kg)  Height: '5\' 5"'$  (1.651 m)   General: Alert and orientated x3. Patient is sitting comfortably in the room  Eyes: EOM intact  Head: Normocephalic, atraumatic  Cardio: Regular rate and rhythm, no murmurs, rubs or gallops. 2+ pulses to bilateral upper and lower extremities  Pulmonary: Clear to ausculation bilaterally with no rales, rhonchi, and crackles  Neuro: Bilateral lower extremities with decreased sensation.  Patient is unable to distinguish between sharp and dull stimulation at her digits in her bilateral lower extremities.   Patient is able to distinguish between sharp and dull stimulation on her dorsal and plantar surfaces   Assessment & Plan:   History of iritis Patient reports a history of iritis.  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  Patient notes that she has seen ophthalmology for this in the past.  She states that this has not bothered her since January 2023.  Plan: - Per my assessment, patient does not have any acute iritis. 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  Patient is followed by orthopedics for this.  Patient states that she is not having any right shoulder issues today.  Patient does have limited right shoulder range of motion.  Left shoulder range of motion normal.  Plan: - Continue to follow orthopedics  Distal symmetric polyneuropathy Patient reports that she has been having this numbness and tingling to her bilateral lower extremities.  Patient states that this has been going on for a couple of months now.  She notes that it comes and goes.  She states that she might think it is diabetes related.  Patient denies any polydipsia or polyuria.  She states that her brother does have diabetes.  Patient denies any wounds or injury to the area.  Patient denies any thing that makes it better or worse.  Plan: - Obtain A1c to see if this is related to diabetes in any way.  Healthcare maintenance Patient  presents to the internal medicine clinic today to establish a PCP.  Patient reports that she has not had a PCP in a long time and also reports that she may have never had one.  Patient has no baseline labs.  Patient has no records in care everywhere.  Patient states that she thought it would be a good idea to get her health in check because her brother had a stroke 2 weeks ago, and presents to the clinic today.  I plan to close all of her care gaps and also address baseline labs.   Plan: - Refer  the patient for colonoscopy as the patient is due for a colonoscopy and she has never had one. - Refer the patient for mammogram as she is due. - Obtain Pap smear on next appointment. - Screen for hepatitis C and HIV - I recommend shingles and Tdap.  She states that she will take her Tdap vaccine, but will go to the pharmacy for this.  She denies the shingles vaccine. - I obtain a lipid panel, CMP, and CBC   Patient seen with Dr. Alyse Low, DO PGY-1 Internal Medicine Resident  Pager: [phone]

## 2021-09-29 NOTE — Patient Instructions (Signed)
Joann Campos ,Thank you for allowing me to take part in your care today.  Here are your instructions.  1.  Follow-up in 1 week regarding all your lab results. 2.  I have referred you to gastroenterology for your colonoscopy. 3.  I referred you for a screening mammogram. 4.  Please go to your pharmacy for your Tdap vaccine. 5.  I have inputted all your labs to be drawn today, I will call you with the results.  Please await my phone call. 6.  If you have any other concerns please give [place] a phone call.  Thank you, Dr. Posey Pronto  If you have any other questions please contact the internal medicine clinic at [phone]

## 2021-09-29 NOTE — Assessment & Plan Note (Signed)
Patient has a longstanding history of frozen shoulder.  Patient is followed by orthopedics for this.  Patient states that she is not having any right shoulder issues today.  Patient does have limited right shoulder range of motion.  Left shoulder range of motion normal.  Plan: - Continue to follow orthopedics

## 2021-09-29 NOTE — Assessment & Plan Note (Signed)
Patient reports that she has been having this numbness and tingling to her bilateral lower extremities.  Patient states that this has been going on for a couple of months now.  She notes that it comes and goes.  She states that she might think it is diabetes related.  Patient denies any polydipsia or polyuria.  She states that her brother does have diabetes.  Patient denies any wounds or injury to the area.  Patient denies any thing that makes it better or worse.  Plan: - Obtain A1c to see if this is related to diabetes in any way.

## 2021-09-29 NOTE — Assessment & Plan Note (Addendum)
Patient reports a history of iritis.  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  Patient notes that she has seen ophthalmology for this in the past.  She states that this has not bothered her since January 2023.  Plan: - Per my assessment, patient does not have any acute iritis. - Follow ophthalmology and tried to obtain records

## 2021-09-29 NOTE — Assessment & Plan Note (Addendum)
Patient presents to the internal medicine clinic today to establish a PCP.  Patient reports that she has not had a PCP in a long time and also reports that she may have never had one.  Patient has no baseline labs.  Patient has no records in care everywhere.  Patient states that she thought it would be a good idea to get her health in check because her brother had a stroke 2 weeks ago, and presents to the clinic today.  I plan to close all of her care gaps and also address baseline labs.   Plan: - Refer the patient for colonoscopy as the patient is due for a colonoscopy and she has never had one. - Refer the patient for mammogram as she is due. - Obtain Pap smear on next appointment. - Screening for hepatitis C and HIV negative - I recommend shingles and Tdap.  She states that she will take her Tdap vaccine, but will go to the pharmacy for this.  She denies the shingles vaccine. - Lipid panel shows elevated cholesterol.  CMP showing elevated glucose, but normal kidney and liver function.  CBC within normal limits.

## 2021-09-30 ENCOUNTER — Telehealth: Payer: Self-pay | Admitting: Student

## 2021-09-30 ENCOUNTER — Encounter: Payer: Self-pay | Admitting: Physical Medicine and Rehabilitation

## 2021-09-30 ENCOUNTER — Ambulatory Visit (INDEPENDENT_AMBULATORY_CARE_PROVIDER_SITE_OTHER): Payer: 59 | Admitting: Physical Medicine and Rehabilitation

## 2021-09-30 ENCOUNTER — Ambulatory Visit: Payer: Self-pay

## 2021-09-30 DIAGNOSIS — E1169 Type 2 diabetes mellitus with other specified complication: Secondary | ICD-10-CM | POA: Insufficient documentation

## 2021-09-30 DIAGNOSIS — E782 Mixed hyperlipidemia: Secondary | ICD-10-CM | POA: Insufficient documentation

## 2021-09-30 DIAGNOSIS — M25511 Pain in right shoulder: Secondary | ICD-10-CM | POA: Diagnosis not present

## 2021-09-30 DIAGNOSIS — G8929 Other chronic pain: Secondary | ICD-10-CM

## 2021-09-30 DIAGNOSIS — E119 Type 2 diabetes mellitus without complications: Secondary | ICD-10-CM | POA: Insufficient documentation

## 2021-09-30 LAB — LIPID PANEL
Chol/HDL Ratio: 7.3 ratio — ABNORMAL HIGH (ref 0.0–4.4)
Cholesterol, Total: 396 mg/dL — ABNORMAL HIGH (ref 100–199)
HDL: 54 mg/dL (ref >39)
LDL Chol Calc (NIH): 299 mg/dL — ABNORMAL HIGH (ref 0–99)
Triglycerides: 203 mg/dL — ABNORMAL HIGH (ref 0–149)
VLDL Cholesterol Cal: 43 mg/dL — ABNORMAL HIGH (ref 5–40)

## 2021-09-30 LAB — CMP14 + ANION GAP
ALT: 14 IU/L (ref 0–32)
AST: 14 IU/L (ref 0–40)
Albumin/Globulin Ratio: 2.1 (ref 1.2–2.2)
Albumin: 4.6 g/dL (ref 3.9–4.9)
Alkaline Phosphatase: 104 IU/L (ref 44–121)
Anion Gap: 22 mmol/L — ABNORMAL HIGH (ref 10.0–18.0)
BUN/Creatinine Ratio: 34 — ABNORMAL HIGH (ref 12–28)
BUN: 21 mg/dL (ref 8–27)
Bilirubin Total: 0.3 mg/dL (ref 0.0–1.2)
CO2: 21 mmol/L (ref 20–29)
Calcium: 10.1 mg/dL (ref 8.7–10.3)
Chloride: 98 mmol/L (ref 96–106)
Creatinine, Ser: 0.62 mg/dL (ref 0.57–1.00)
Globulin, Total: 2.2 g/dL (ref 1.5–4.5)
Glucose: 302 mg/dL — ABNORMAL HIGH (ref 70–99)
Potassium: 4.6 mmol/L (ref 3.5–5.2)
Sodium: 141 mmol/L (ref 134–144)
Total Protein: 6.8 g/dL (ref 6.0–8.5)
eGFR: 100 mL/min/{1.73_m2} (ref >59)

## 2021-09-30 LAB — HIV ANTIBODY (ROUTINE TESTING W REFLEX): HIV Screen 4th Generation wRfx: NONREACTIVE

## 2021-09-30 LAB — HCV AB W REFLEX TO QUANT PCR: HCV Ab: NONREACTIVE

## 2021-09-30 LAB — HEMOGLOBIN A1C
Est. average glucose Bld gHb Est-mCnc: 324 mg/dL
Hgb A1c MFr Bld: 12.9 % — ABNORMAL HIGH (ref 4.8–5.6)

## 2021-09-30 LAB — CBC
Hematocrit: 48.7 % — ABNORMAL HIGH (ref 34.0–46.6)
Hemoglobin: 16.5 g/dL — ABNORMAL HIGH (ref 11.1–15.9)
MCH: 29.7 pg (ref 26.6–33.0)
MCHC: 33.9 g/dL (ref 31.5–35.7)
MCV: 88 fL (ref 79–97)
Platelets: 382 10*3/uL (ref 150–450)
RBC: 5.55 x10E6/uL — ABNORMAL HIGH (ref 3.77–5.28)
RDW: 13.1 % (ref 11.7–15.4)
WBC: 6.1 10*3/uL (ref 3.4–10.8)

## 2021-09-30 LAB — HCV INTERPRETATION

## 2021-09-30 MED ORDER — METFORMIN HCL ER (OSM) 500 MG PO TB24: 500.0000 mg | ORAL_TABLET | Freq: Every day | ORAL | 2 refills | Status: DC

## 2021-09-30 MED ORDER — ROSUVASTATIN CALCIUM 40 MG PO TABS: 40.0000 mg | ORAL_TABLET | Freq: Every day | ORAL | 2 refills | Status: DC

## 2021-09-30 NOTE — Progress Notes (Signed)
Internal Medicine Clinic Attending  I saw and evaluated the patient.  I personally confirmed the key portions of the history and exam documented by the resident  and I reviewed pertinent patient test results.  The assessment, diagnosis, and plan were formulated together and I agree with the documentation in the resident's note.  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

## 2021-09-30 NOTE — Progress Notes (Signed)
Pt state right shoulder pain. Pt state any movement with her right arm cause the pain worse to get worse. Pt state she takes over the counter pain meds to help ease her pain.  Numeric Pain Rating Scale and Functional Assessment Average Pain 4   In the last MONTH (on 0-10 scale) has pain interfered with the following?  1. General activity like being  able to carry out your everyday physical activities such as walking, climbing stairs, carrying groceries, or moving a chair?  Rating(8)   -BT, -Dye Allergies.

## 2021-09-30 NOTE — Assessment & Plan Note (Signed)
Patient presents to clinic to establish care.  Given her strong family history of hypertension and stroke, I feel it is appropriate to obtain a lipid panel.  Lipid panel resulted in elevated cholesterol levels.  Plan: - Lipid panel shows elevated cholesterol of 396, triglycerides at 203.  HDL 54.  And LDL of 299. - Start patient on Crestor 40 mg daily.

## 2021-09-30 NOTE — Telephone Encounter (Signed)
I informed the patient of her lab results.  She is understanding of her lab results.  She will make a follow-up for 07/17 to follow-up about her lab results.

## 2021-09-30 NOTE — Assessment & Plan Note (Signed)
Patient presents to the clinic to establish care.  She states that she has not had a primary care physician in a while.  She states that she thinks she may be diabetic.  She has a family medical history of diabetes.  Patient's A1c is 12.9.  Plan: - As patient's A1c is 12.9 and BMP shows a blood sugar in the 300s, I am inclined to start medication at this time.  Patient is agreeable. - Start metformin 500 mg p.o. daily.

## 2021-09-30 NOTE — Progress Notes (Signed)
   Joann Campos - 66 y.o. female MRN [id number]  Date of birth: February 20, 1956  Office Visit Note: Visit Date: 09/30/2021 PCP: Leigh Aurora, DO Referred by: Meredith Pel, MD  Subjective: Chief Complaint  Patient presents with   Right Shoulder - Pain   HPI:  Joann Campos is a 66 y.o. female who comes in today at the request of Dr. Anderson Malta for planned Right anesthetic glenohumeral arthrogram with fluoroscopic guidance.  The patient has failed conservative care including home exercise, medications, time and activity modification.  This injection will be diagnostic and hopefully therapeutic.  Please see requesting physician notes for further details and justification.   ROS Otherwise per HPI.  Assessment & Plan: Visit Diagnoses:    ICD-10-CM   1. Chronic right shoulder pain  M25.511 Large Joint Inj: R glenohumeral   G89.29 XR C-ARM NO REPORT      Plan: No additional findings.   Meds & Orders: No orders of the defined types were placed in this encounter.   Orders Placed This Encounter  Procedures   Large Joint Inj: R glenohumeral   XR C-ARM NO REPORT    Follow-up: No follow-ups on file.   Procedures: Large Joint Inj: R glenohumeral on 09/30/2021 10:32 AM Indications: pain and diagnostic evaluation Details: 22 G 3.5 in needle, fluoroscopy-guided anteromedial approach  Arthrogram: No  Medications: 40 mg triamcinolone acetonide 40 MG/ML; 5 mL bupivacaine 0.25 % Outcome: tolerated well, no immediate complications  There was excellent flow of contrast producing a partial arthrogram of the glenohumeral joint. The patient did have relief of symptoms during the anesthetic phase of the injection. Procedure, treatment alternatives, risks and benefits explained, specific risks discussed. Consent was given by the patient. Immediately prior to procedure a time out was called to verify the correct patient, procedure, equipment, support staff and site/side marked as required.  Patient was prepped and draped in the usual sterile fashion.          Clinical History: No specialty comments available.     Objective:  VS:  HT:    WT:   BMI:     BP:   HR: bpm  TEMP: ( )  RESP:  Physical Exam   Imaging: No results found.

## 2021-09-30 NOTE — Telephone Encounter (Signed)
I spoke with the patient regarding her lab results today.  She understands that she has a new diagnosis of diabetes.  She also understands her new diagnosis of hyperlipidemia.  I speak with Dr. Heber Tallmadge about this and together we decided that it is appropriate to start treatment.  Patient is agreeable to treatment.  Patient will be started on Crestor 40 mg daily and metformin 500 mg daily.  Plan is to uptitrate metformin.  Patient will follow-up in the clinic on 07/17.

## 2021-09-30 NOTE — Addendum Note (Signed)
Addended by: Leigh Aurora on: 09/30/2021 05:47 PM   Modules accepted: Orders

## 2021-10-04 ENCOUNTER — Ambulatory Visit (INDEPENDENT_AMBULATORY_CARE_PROVIDER_SITE_OTHER): Payer: 59 | Admitting: Student

## 2021-10-04 VITALS — BP 130/74 | HR 87 | Temp 98.0°F | Wt 126.0 lb

## 2021-10-04 DIAGNOSIS — Z7984 Long term (current) use of oral hypoglycemic drugs: Secondary | ICD-10-CM | POA: Diagnosis not present

## 2021-10-04 DIAGNOSIS — E782 Mixed hyperlipidemia: Secondary | ICD-10-CM

## 2021-10-04 DIAGNOSIS — E119 Type 2 diabetes mellitus without complications: Secondary | ICD-10-CM

## 2021-10-04 DIAGNOSIS — Z0000 Encounter for general adult medical examination without abnormal findings: Secondary | ICD-10-CM

## 2021-10-04 MED ORDER — ROSUVASTATIN CALCIUM 40 MG PO TABS: 40.0000 mg | ORAL_TABLET | Freq: Every day | ORAL | 3 refills | Status: DC

## 2021-10-04 MED ORDER — METFORMIN HCL ER 500 MG PO TB24: ORAL_TABLET | ORAL | 0 refills | Status: DC

## 2021-10-04 MED ORDER — METFORMIN HCL ER 500 MG PO TB24: 1000.0000 mg | ORAL_TABLET | Freq: Two times a day (BID) | ORAL | 3 refills | Status: DC

## 2021-10-04 NOTE — Patient Instructions (Addendum)
Joann Campos ,Thank you for allowing me to take part in your care today.  Here are your instructions.  1. Continue to take all of your medications as prescribed. Follow the taper guidelines that we handed to you today. Remember, to ask the pharmacy about the new metformin prescription as it will be cheaper. Do not pick up the old one that I sent in.   2.Continue to make lifestyle modifications   3.Return in 1 month for PAP smear.   Thank you, Dr. Posey Pronto  If you have any other questions please contact the internal medicine clinic at [phone]

## 2021-10-04 NOTE — Assessment & Plan Note (Signed)
Patient was due for PAP today. Patient declined PAP today.  Plan: -Obtain pap at next visit

## 2021-10-04 NOTE — Progress Notes (Signed)
CC: Follow up for Diabetes and Hyperlipidemia   HPI:  [name] is a 66 y.o. female who presents to the clinic to follow-up on her labs.  Patient's labs resulted, showing patient has hyperlipidemia and diabetes mellitus.  Given these diagnoses, patient comes in to discuss these.  Patient reports that she has been having neuropathy.  This is unchanged since last visit.  Patient states that she started her Crestor, but was unable to pick up her metformin as the pharmacy ran out.  Patient will start her metformin as soon as she gets it.  Patient has had no side effects on Crestor.  Please see assessment and plan for full HPI.  Past Medical History:  Diagnosis Date   Hip pain 2021     Current Outpatient Medications:    metFORMIN (GLUCOPHAGE-XR) 500 MG 24 hr tablet, Take 2 tablets (1,000 mg total) by mouth 2 (two) times daily with a meal., Disp: 360 tablet, Rfl: 3   metFORMIN (GLUCOPHAGE-XR) 500 MG 24 hr tablet, Take 1 tablet (500 mg total) by mouth daily with breakfast for 7 days, THEN 1 tablet (500 mg total) 2 (two) times daily with a meal for 14 days, THEN 2 tablets (1,000 mg total) 2 (two) times daily with a meal for 7 days., Disp: 63 tablet, Rfl: 0   rosuvastatin (CRESTOR) 40 MG tablet, Take 1 tablet (40 mg total) by mouth daily., Disp: 90 tablet, Rfl: 3  Review of Systems:   Neuro: Patient endorses neuropathy in bilateral lower extremities.  Physical Exam:  Vitals:   10/04/21 0943  BP: 130/74  Pulse: 87  Temp: 98 F (36.7 C)  TempSrc: Oral  SpO2: 100%  Weight: 126 lb (57.2 kg)    General: Alert and orientated x3. Patient is sitting comfortably in the room  Eyes: EOM intact  Cardio: Regular rate and rhythm, no murmurs, rubs or gallops. 2+ pulses to bilateral upper and lower extremities  Pulmonary: Clear to ausculation bilaterally with no rales, rhonchi, and crackles    Assessment & Plan:   Healthcare maintenance Patient was due for PAP today. Patient declined  PAP today.  Plan: -Obtain pap at next visit   Mixed hyperlipidemia Patient followed up today for lab results.  I explained to her what her lab results mean.  I inform her that she has a diagnosis of hyperlipidemia.  I counseled patient on how to approach this with lifestyle modifications.  I also informed her about medications.  Plan: - ASCVD  13.4% - Crestor 40 mg daily - Continue lifestyle modifications - Follow-up in 3 months for evaluation  Diabetes mellitus type 2 in nonobese [facility]) Patient following up on her labs today.  I informed patient that her A1c was 12.9.  Patient agrees to be started on medication at that time.  I counseled her on what it means to be diabetic.  She understands her risk.  She states that her diet has contributed to this.  She understands the importance of good glycemic control.  I explained to her the side effects of metformin.  She understands the side effects.  Plan is to titrate her to 1000 mg metformin twice daily.  Patient is counseled on lifestyle modifications.  Plan: - Patient to follow-up in 3 months for repeat A1c - Meanwhile, start titrating up metformin.  This will be done by taking 500 mg for 7 days.  The next 7 days increase to 500 mg twice daily.  The next 7 days increase up to 500  mg in the morning and 1000 mg at night.  The next 7 days increase up to 1000 mg in the morning and 1000 mg at night to be continued for the long-term. -Continue to make lifestyle modifications.    Patient seen with Dr. Alyse Low, DO PGY-1 Internal Medicine Resident  Pager: [phone]

## 2021-10-04 NOTE — Assessment & Plan Note (Signed)
Patient followed up today for lab results.  I explained to her what her lab results mean.  I inform her that she has a diagnosis of hyperlipidemia.  I counseled patient on how to approach this with lifestyle modifications.  I also informed her about medications.  Plan: - ASCVD  13.4% - Crestor 40 mg daily - Continue lifestyle modifications - Follow-up in 3 months for evaluation

## 2021-10-04 NOTE — Assessment & Plan Note (Signed)
Patient following up on her labs today.  I informed patient that her A1c was 12.9.  Patient agrees to be started on medication at that time.  I counseled her on what it means to be diabetic.  She understands her risk.  She states that her diet has contributed to this.  She understands the importance of good glycemic control.  I explained to her the side effects of metformin.  She understands the side effects.  Plan is to titrate her to 1000 mg metformin twice daily.  Patient is counseled on lifestyle modifications.  Plan: - Patient to follow-up in 3 months for repeat A1c - Meanwhile, start titrating up metformin.  This will be done by taking 500 mg for 7 days.  The next 7 days increase to 500 mg twice daily.  The next 7 days increase up to 500 mg in the morning and 1000 mg at night.  The next 7 days increase up to 1000 mg in the morning and 1000 mg at night to be continued for the long-term. -Continue to make lifestyle modifications.

## 2021-10-06 NOTE — Progress Notes (Signed)
Internal Medicine Clinic Attending  I saw and evaluated the patient.  I personally confirmed the key portions of the history and exam documented by the resident  and I reviewed pertinent patient test results.  The assessment, diagnosis, and plan were formulated together and I agree with the documentation in the resident's note.  

## 2021-10-18 MED ORDER — BUPIVACAINE HCL 0.25 % IJ SOLN
5.0000 mL | INTRAMUSCULAR | Status: AC | PRN
  Administered 2021-09-30: 5 mL via INTRA_ARTICULAR

## 2021-10-18 MED ORDER — TRIAMCINOLONE ACETONIDE 40 MG/ML IJ SUSP
40.0000 mg | INTRAMUSCULAR | Status: AC | PRN
  Administered 2021-09-30: 40 mg via INTRA_ARTICULAR

## 2021-10-26 ENCOUNTER — Other Ambulatory Visit: Payer: Self-pay | Admitting: Student

## 2021-11-08 ENCOUNTER — Ambulatory Visit
Admission: RE | Admit: 2021-11-08 | Discharge: 2021-11-08 | Disposition: A | Discharge status: Home / Self Care | Payer: 59 | Source: Ambulatory Visit | Attending: Internal Medicine | Admitting: Internal Medicine

## 2021-11-08 DIAGNOSIS — Z0000 Encounter for general adult medical examination without abnormal findings: Secondary | ICD-10-CM

## 2021-11-24 ENCOUNTER — Encounter: Payer: Self-pay | Admitting: Gastroenterology

## 2021-12-02 ENCOUNTER — Encounter: Payer: Self-pay | Admitting: Dietician

## 2021-12-02 ENCOUNTER — Ambulatory Visit (INDEPENDENT_AMBULATORY_CARE_PROVIDER_SITE_OTHER): Payer: 59 | Admitting: Dietician

## 2021-12-02 DIAGNOSIS — E119 Type 2 diabetes mellitus without complications: Secondary | ICD-10-CM

## 2021-12-02 LAB — GLUCOSE, CAPILLARY: Glucose-Capillary: 185 mg/dL — ABNORMAL HIGH (ref 70–99)

## 2021-12-02 NOTE — Patient Instructions (Signed)
Please make appointments with me and the doctor.  We covered today:   What is diabetes  Options for Treatmnt Monitoring- a1c vs blood sugar Meal planning- I can have carbs and best to spread out over the day.  Butch Penny [phone]

## 2021-12-02 NOTE — Progress Notes (Signed)
Diabetes Self-Management Education  Visit Type: First/Initial  Appt. Start Time: 925 Appt. End Time: 5427  12/02/2021  [name], identified by name and date of birth, is a 66 y.o. female with a diagnosis of Diabetes: Type 2.   ASSESSMENT  Has lost weight by making intentional changes using mindful eating, but also suspects she has had diabetes for awhile. Her weight was 184# at one point, knows she was 175-5 years. She describes her diet as not very healthful. I do not recommend any further weight loss for her with low BMI. Suggest medicines that do not cause weigh loss and are cardioprotective. Low dose SGLT-2 perhaps and titrating her metformin. Low threshold for basal insulin..   She does not feel groups diabetes self management classes would help her nor benefit her.    Weight 120 lb 6.4 oz (54.6 kg). Body mass index is 20.04 kg/m.  Wt Readings from Last 10 Encounters:  12/02/21 120 lb 6.4 oz (54.6 kg)  10/04/21 126 lb (57.2 kg)  09/29/21 128 lb 12.8 oz (58.4 kg)  04/15/20 132 lb (59.9 kg)      Diabetes Self-Management Education - 12/02/21 0900       Visit Information   Visit Type First/Initial      Initial Visit   Diabetes Type Type 2    Date Diagnosed 09/2021    Are you currently following a meal plan? Yes   limiting some starchy foods   What type of meal plan do you follow? limits some starchy foods    Are you taking your medications as prescribed? Yes   tolerating without problems     Health Coping   How would you rate your overall health? --   will assess at next visit     Psychosocial Assessment   Patient Belief/Attitude about Diabetes Motivated to manage diabetes   "overwhelmed"   What is the hardest part about your diabetes right now, causing you the most concern, or is the most worrisome to you about your diabetes?   --   will assess at next visit   Self-care barriers Unable to determine    Self-management support Doctor's office;Family;None     Patient Concerns Nutrition/Meal planning;Medication;Glycemic Control;Support    Special Needs None    Preferred Learning Style No preference indicated    Learning Readiness Ready    How often do you need to have someone help you when you read instructions, pamphlets, or other written materials from your doctor or pharmacy? 2 - Rarely    What is the last grade level you completed in school? will assess at next visit      Pre-Education Assessment   Patient understands the diabetes disease and treatment process. Needs Instruction    Patient understands incorporating nutritional management into lifestyle. Needs Instruction    Patient undertands incorporating physical activity into lifestyle. Needs Instruction    Patient understands using medications safely. Needs Instruction    Patient understands monitoring blood glucose, interpreting and using results Needs Instruction    Patient understands prevention, detection, and treatment of acute complications. Needs Instruction    Patient understands prevention, detection, and treatment of chronic complications. Needs Instruction    Patient understands how to develop strategies to address psychosocial issues. Comprehends key points    Patient understands how to develop strategies to promote health/change behavior. Comprehends key points      Complications   Last HgB A1C per patient/outside source 12.9 %    How often do you  check your blood sugar? 0 times/day (not testing)   interested in try a sample dexcom CGM   Fasting Blood glucose range (mg/dL) 180-200   was fasting today with glucsoe 185   Postprandial Blood glucose range (mg/dL) --   will assess at a future visit   Number of hypoglycemic episodes per month 0    Number of hyperglycemic episodes ( >'200mg'$ /dL): --   is unaware of symptoms   Have you had a dilated eye exam in the past 12 months? --   ran out of time,will assess at future visit   Have you had a dental exam in the past 12 months? --    ran out of time,will assess at future visit   Are you checking your feet? --   ran out of time,will assess at future visit     Dietary Intake   Breakfast ran out of time,will assess at future visit      Activity / Exercise   Activity / Exercise Type ADL's   ran out of time,will assess at future visit     Individualized Goals (developed by patient)   Health Coping Other (comment)   schedule with doctor and myself on same day     Outcomes   Expected Outcomes Demonstrated interest in learning. Expect positive outcomes    Future DMSE 4-6 wks    Program Status Not Completed             Individualized Plan for Diabetes Self-Management Training:   Learning Objective:  Patient will have a greater understanding of diabetes self-management. Patient education plan is to attend individual and/or group sessions per assessed needs and concerns.   Plan:   Patient Instructions  Please make appointments with me and the doctor.  We covered today:   What is diabetes  Options for Treatmnt Monitoring- a1c vs blood sugar Meal planning- I can have carbs and best to spread out over the day.  Butch Penny [phone]      Expected Outcomes:  Demonstrated interest in learning. Expect positive outcomes  Education material provided: Support group flyer and Diabetes Resources  If problems or questions, patient to contact team via:  Phone and Email  Future DSME appointment: 4-6 wks Debera Lat, RD 12/02/2021 2:08 PM.

## 2021-12-03 ENCOUNTER — Ambulatory Visit (AMBULATORY_SURGERY_CENTER): Payer: 59 | Admitting: *Deleted

## 2021-12-03 VITALS — Ht 65.0 in | Wt 122.0 lb

## 2021-12-03 DIAGNOSIS — Z1211 Encounter for screening for malignant neoplasm of colon: Secondary | ICD-10-CM

## 2021-12-03 MED ORDER — NA SULFATE-K SULFATE-MG SULF 17.5-3.13-1.6 GM/177ML PO SOLN: 1.0000 | Freq: Once | ORAL | 0 refills | Status: AC

## 2021-12-03 NOTE — Progress Notes (Signed)
No egg or soy allergy known to patient  No issues known to pt with past sedation with any surgeries or procedures Patient denies ever being told they had issues or difficulty with intubation  No FH of Malignant Hyperthermia Pt is not on diet pills Pt is not on  home 02  Pt is not on blood thinners  Pt denies issues with constipation  No A fib or A flutter Have any cardiac testing pending--no Pt instructed to use [email or web address] or GoodRx for a price reduction on prep   

## 2021-12-23 ENCOUNTER — Encounter: Payer: 59 | Admitting: Gastroenterology

## 2021-12-23 ENCOUNTER — Encounter: Payer: Self-pay | Admitting: Gastroenterology

## 2022-01-04 ENCOUNTER — Telehealth: Payer: Self-pay

## 2022-01-04 ENCOUNTER — Other Ambulatory Visit: Payer: Self-pay

## 2022-01-04 ENCOUNTER — Ambulatory Visit (AMBULATORY_SURGERY_CENTER): Payer: 59 | Admitting: Gastroenterology

## 2022-01-04 ENCOUNTER — Encounter: Payer: Self-pay | Admitting: Gastroenterology

## 2022-01-04 VITALS — BP 131/75 | HR 75 | Temp 96.7°F | Resp 10 | Ht 65.0 in | Wt 122.0 lb

## 2022-01-04 DIAGNOSIS — K6289 Other specified diseases of anus and rectum: Secondary | ICD-10-CM

## 2022-01-04 DIAGNOSIS — Z1211 Encounter for screening for malignant neoplasm of colon: Secondary | ICD-10-CM

## 2022-01-04 DIAGNOSIS — D125 Benign neoplasm of sigmoid colon: Secondary | ICD-10-CM

## 2022-01-04 DIAGNOSIS — D128 Benign neoplasm of rectum: Secondary | ICD-10-CM

## 2022-01-04 DIAGNOSIS — K642 Third degree hemorrhoids: Secondary | ICD-10-CM

## 2022-01-04 MED ORDER — SODIUM CHLORIDE 0.9 % IV SOLN: 500.0000 mL | Freq: Once | INTRAVENOUS | Status: DC

## 2022-01-04 NOTE — Telephone Encounter (Signed)
-----   Message from Hettinger, DO sent at 01/04/2022  9:56 AM EDT ----- Please schedule colonsocopy with EMR at [facility] Endo unit, next available. Thanks

## 2022-01-04 NOTE — Progress Notes (Signed)
GASTROENTEROLOGY PROCEDURE H&P NOTE   Primary Care Physician: Leigh Aurora, DO    Reason for Procedure:  Colon Cancer screening  Plan:    Colonoscopy  Patient is appropriate for endoscopic procedure(s) in the ambulatory ([facility]) setting.  The nature of the procedure, as well as the risks, benefits, and alternatives were carefully and thoroughly reviewed with the patient. Ample time for discussion and questions allowed. The patient understood, was satisfied, and agreed to proceed.     HPI: Joann Campos is a 66 y.o. female who presents for colonoscopy for routine Colon Cancer screening.  No active GI symptoms.    Past Medical History:  Diagnosis Date   Diabetes ([place]) 09/2021   Hip pain 2021   Hyperlipidemia     Past Surgical History:  Procedure Laterality Date   BREAST REDUCTION SURGERY Bilateral    1990s   CHOLECYSTECTOMY      Prior to Admission medications   Medication Sig Start Date End Date Taking? Authorizing Provider  Cyanocobalamin (VITAMIN B 12 PO) Take by mouth daily. One tablet   Yes [provider]  metFORMIN (GLUCOPHAGE-XR) 500 MG 24 hr tablet Take 2 tablets (1,000 mg total) by mouth 2 (two) times daily with a meal. 10/04/21  Yes Leigh Aurora, DO  Multiple Vitamins-Minerals (MULTIVITAMIN ADULTS PO) Take by mouth daily. Women over 50   Yes [provider]  rosuvastatin (CRESTOR) 40 MG tablet Take 1 tablet (40 mg total) by mouth daily. 10/04/21 09/29/22 Yes Patel, Amar, DO  metFORMIN (GLUCOPHAGE-XR) 500 MG 24 hr tablet Take 1 tablet (500 mg total) by mouth daily with breakfast for 7 days, THEN 1 tablet (500 mg total) 2 (two) times daily with a meal for 14 days, THEN 2 tablets (1,000 mg total) 2 (two) times daily with a meal for 7 days. 10/04/21 11/01/21  Leigh Aurora, DO    Current Outpatient Medications  Medication Sig Dispense Refill   Cyanocobalamin (VITAMIN B 12 PO) Take by mouth daily. One tablet     metFORMIN (GLUCOPHAGE-XR) 500 MG 24 hr  tablet Take 2 tablets (1,000 mg total) by mouth 2 (two) times daily with a meal. 360 tablet 3   Multiple Vitamins-Minerals (MULTIVITAMIN ADULTS PO) Take by mouth daily. Women over 50     rosuvastatin (CRESTOR) 40 MG tablet Take 1 tablet (40 mg total) by mouth daily. 90 tablet 3   metFORMIN (GLUCOPHAGE-XR) 500 MG 24 hr tablet Take 1 tablet (500 mg total) by mouth daily with breakfast for 7 days, THEN 1 tablet (500 mg total) 2 (two) times daily with a meal for 14 days, THEN 2 tablets (1,000 mg total) 2 (two) times daily with a meal for 7 days. 63 tablet 0   Current Facility-Administered Medications  Medication Dose Route Frequency Provider Last Rate Last Admin   0.9 %  sodium chloride infusion  500 mL Intravenous Once Joanann Mies V, DO       0.9 %  sodium chloride infusion  500 mL Intravenous Once Yolander Goodie V, DO       0.9 %  sodium chloride infusion  500 mL Intravenous Once Daeron Carreno V, DO        Allergies as of 01/04/2022 - Review Complete 12/03/2021  Allergen Reaction Noted   No known allergies  01/04/2022    Family History  Problem Relation Age of Onset   Colon cancer Mother    Hypercholesterolemia Father    Diabetes Brother    CVA Brother    CVA  Sibling    Crohn's disease Neg Hx    Esophageal cancer Neg Hx    Rectal cancer Neg Hx    Stomach cancer Neg Hx    Ulcerative colitis Neg Hx     Social History   Socioeconomic History   Marital status: Married    Spouse name: Not on file   Number of children: Not on file   Years of education: Not on file   Highest education level: Not on file  Occupational History   Not on file  Tobacco Use   Smoking status: Former    Passive exposure: Never   Smokeless tobacco: Never  Vaping Use   Vaping Use: Never used  Substance and Sexual Activity   Alcohol use: Never   Drug use: Never   Sexual activity: Not on file  Other Topics Concern   Not on file  Social History Narrative   Not on file   Social Determinants  of Health   Financial Resource Strain: Not on file  Food Insecurity: Not on file  Transportation Needs: Not on file  Physical Activity: Not on file  Stress: Not on file  Social Connections: Not on file  Intimate Partner Violence: Not on file    Physical Exam: Vital signs in last 24 hours: '@BP'$  95/63 (BP Location: Right Arm, Patient Position: Sitting, Cuff Size: Normal)   Pulse (!) 110   Temp (!) 96.7 F (35.9 C) (Temporal)   Ht '5\' 5"'$  (1.651 m)   Wt 122 lb (55.3 kg)   SpO2 98%   BMI 20.30 kg/m  GEN: NAD EYE: Sclerae anicteric ENT: MMM CV: Non-tachycardic Pulm: CTA b/l GI: Soft, NT/ND NEURO:  Alert & Oriented x [place], DO [facility]   01/04/2022 9:20 AM

## 2022-01-04 NOTE — Patient Instructions (Signed)
Handouts on polyps and diverticulosis given to you today  Await pathology results   YOU HAD AN ENDOSCOPIC PROCEDURE TODAY AT [facility]:   Refer to the procedure report that was given to you for any specific questions about what was found during the examination.  If the procedure report does not answer your questions, please call your gastroenterologist to clarify.  If you requested that your care partner not be given the details of your procedure findings, then the procedure report has been included in a sealed envelope for you to review at your convenience later.  YOU SHOULD EXPECT: Some feelings of bloating in the abdomen. Passage of more gas than usual.  Walking can help get rid of the air that was put into your GI tract during the procedure and reduce the bloating. If you had a lower endoscopy (such as a colonoscopy or flexible sigmoidoscopy) you may notice spotting of blood in your stool or on the toilet paper. If you underwent a bowel prep for your procedure, you may not have a normal bowel movement for a few days.  Please Note:  You might notice some irritation and congestion in your nose or some drainage.  This is from the oxygen used during your procedure.  There is no need for concern and it should clear up in a day or so.  SYMPTOMS TO REPORT IMMEDIATELY:  Following lower endoscopy (colonoscopy or flexible sigmoidoscopy):  Excessive amounts of blood in the stool  Significant tenderness or worsening of abdominal pains  Swelling of the abdomen that is new, acute  Fever of 100F or higher  For urgent or emergent issues, a gastroenterologist can be reached at any hour by calling [phone]. Do not use MyChart messaging for urgent concerns.    DIET:  We do recommend a small meal at first, but then you may proceed to your regular diet.  Drink plenty of fluids but you should avoid alcoholic beverages for 24 hours.  ACTIVITY:  You should plan to take it easy for the  rest of today and you should NOT DRIVE or use heavy machinery until tomorrow (because of the sedation medicines used during the test).    FOLLOW UP: Our staff will call the number listed on your records the next business day following your procedure.  We will call around 7:15- 8:00 am to check on you and address any questions or concerns that you may have regarding the information given to you following your procedure. If we do not reach you, we will leave a message.     If any biopsies were taken you will be contacted by phone or by letter within the next 1-3 weeks.  Please call us at [phone] if you have not heard about the biopsies in 3 weeks.   Repeat colonoscopy to be scheduled at [facility] and next available appointment. Will discuss MRI pending pathology results.   SIGNATURES/CONFIDENTIALITY: You and/or your care partner have signed paperwork which will be entered into your electronic medical record.  These signatures attest to the fact that that the information above on your After Visit Summary has been reviewed and is understood.  Full responsibility of the confidentiality of this discharge information lies with you and/or your care-partner.

## 2022-01-04 NOTE — Progress Notes (Signed)
Called to room to assist during endoscopic procedure.  Patient ID and intended procedure confirmed with present staff. Received instructions for my participation in the procedure from the performing physician.  

## 2022-01-04 NOTE — Progress Notes (Signed)
Vitals-SM  Pt's states no medical or surgical changes since previsit or office visit. 

## 2022-01-04 NOTE — Telephone Encounter (Signed)
Colonoscopy with EMR scheduled for 03/01/22 at 10:15 am. Case id: [id number]. Ambulatory referral placed.

## 2022-01-04 NOTE — Op Note (Signed)
[place] Patient Name: Joann Campos Procedure Date: 01/04/2022 9:19 AM MRN: [id number] Endoscopist: Gerrit Heck , MD Age: 66 Referring MD:  Date of Birth: 1955/05/12 Gender: Female Account #: [id number] Procedure:                Colonoscopy Indications:              Screening for colorectal malignant neoplasm, This                            is the patient's first colonoscopy Medicines:                Monitored Anesthesia Care Procedure:                Pre-Anesthesia Assessment:                           - Prior to the procedure, a History and Physical                            was performed, and patient medications and                            allergies were reviewed. The patient's tolerance of                            previous anesthesia was also reviewed. The risks                            and benefits of the procedure and the sedation                            options and risks were discussed with the patient.                            All questions were answered, and informed consent                            was obtained. Prior Anticoagulants: The patient has                            taken no previous anticoagulant or antiplatelet                            agents. ASA Grade Assessment: II - A patient with                            mild systemic disease. After reviewing the risks                            and benefits, the patient was deemed in                            satisfactory condition to undergo the procedure.  After obtaining informed consent, the colonoscope                            was passed under direct vision. Throughout the                            procedure, the patient's blood pressure, pulse, and                            oxygen saturations were monitored continuously. The                            Olympus CF-HQ190L [id number]) Colonoscope was                            introduced through the  anus and advanced to the the                            terminal ileum. The colonoscopy was performed                            without difficulty. The patient tolerated the                            procedure well. The quality of the bowel                            preparation was good. The terminal ileum, ileocecal                            valve, appendiceal orifice, and rectum were                            photographed. Scope In: 9:27:11 AM Scope Out: 9:46:50 AM Scope Withdrawal Time: 0 hours 15 minutes 13 seconds  Total Procedure Duration: 0 hours 19 minutes 39 seconds  Findings:                 Hemorrhoids were found on perianal exam.                           Two sessile polyps were found in the sigmoid colon.                            The polyps were 3 to 5 mm in size. These polyps                            were removed with a cold snare. Resection and                            retrieval were complete. Estimated blood loss was                            minimal.  A 40 mm polyp was found in the rectum. The polyp                            was soft and villous. Initial appearance seemed                            more consistent with a mass, but was able to                            manipulate the polyp with a closed forceps to                            reveal a very thick stalk located at 10-12 cm from                            the anal verge. Based on the resection technique                            needed, I elected to proceed with biopsies of the                            top of the polyp and deferred resection (pending                            path results) to the hospital endoscopy unit. The                            polyp was pedunculated. Estimated blood loss was                            minimal.                           Non-bleeding internal hemorrhoids were found during                            retroflexion. The hemorrhoids  were medium-sized and                            Grade II (internal hemorrhoids that prolapse but                            reduce spontaneously).                           The terminal ileum appeared normal. Complications:            No immediate complications. Estimated Blood Loss:     Estimated blood loss was minimal. Impression:               - Hemorrhoids found on perianal exam.                           - Two 3 to 5 mm polyps in the sigmoid  colon,                            removed with a cold snare. Resected and retrieved.                           - One very large, villous, soft, 40 mm polyp in the                            rectum. Biopsied the top in favor of endoscopic                            resection at the hospital.                           - Non-bleeding internal hemorrhoids.                           - The examined portion of the ileum was normal. Recommendation:           - Patient has a contact number available for                            emergencies. The signs and symptoms of potential                            delayed complications were discussed with the                            patient. Return to normal activities tomorrow.                            Written discharge instructions were provided to the                            patient.                           - Resume previous diet.                           - Continue present medications.                           - Await pathology results.                           - Repeat colonoscopy at [facility]                            Endoscopy Unit at the next available appointment.                            Plan for epinephrine injection, endoloop placement,  resection, with clip closure.                           - Will discuss role/utility of MRI Pelvis pending                            pathology results. Gerrit Heck, MD 01/04/2022 9:55:55 AM

## 2022-01-04 NOTE — Progress Notes (Signed)
Report to PACU, RN, vss, BBS= Clear.  

## 2022-01-05 ENCOUNTER — Telehealth: Payer: Self-pay

## 2022-01-05 NOTE — Telephone Encounter (Signed)
Left message for pt to call back  °

## 2022-01-05 NOTE — Telephone Encounter (Signed)
Attempted to reach patient for post-procedure f/u call. No answer. Left message for her to please not hesitate to call if she has any questions/concerns regarding her care. 

## 2022-01-06 NOTE — Telephone Encounter (Signed)
Accessed in error

## 2022-01-06 NOTE — Telephone Encounter (Signed)
Spoke with pt and let pt know about colonoscopy date and time. Pt is scheduled for pre visit on 02/14/22 at 8:30 am. Pt verbalized understanding.

## 2022-01-17 ENCOUNTER — Encounter: Payer: 59 | Admitting: Student

## 2022-01-17 ENCOUNTER — Other Ambulatory Visit: Payer: Self-pay | Admitting: *Deleted

## 2022-01-17 ENCOUNTER — Encounter: Payer: Self-pay | Admitting: Dietician

## 2022-01-17 MED ORDER — METFORMIN HCL ER 500 MG PO TB24: 1000.0000 mg | ORAL_TABLET | Freq: Two times a day (BID) | ORAL | 1 refills | Status: DC

## 2022-01-17 NOTE — Telephone Encounter (Signed)
Patient notified Rx has been sent and she is very [employer] attorney.

## 2022-01-17 NOTE — Telephone Encounter (Signed)
Patient called in stating her mail order Rx for Metformin should arrive 11/2. She is requesting 14 tabs sent to [facility] to get her through till shipment arrives. She does not have any tabs left for this evening's dose.

## 2022-01-18 ENCOUNTER — Encounter: Payer: 59 | Admitting: Dietician

## 2022-01-31 ENCOUNTER — Ambulatory Visit (INDEPENDENT_AMBULATORY_CARE_PROVIDER_SITE_OTHER): Payer: 59 | Admitting: Student

## 2022-01-31 VITALS — BP 94/68 | HR 120 | Wt 116.1 lb

## 2022-01-31 DIAGNOSIS — E782 Mixed hyperlipidemia: Secondary | ICD-10-CM

## 2022-01-31 DIAGNOSIS — G6289 Other specified polyneuropathies: Secondary | ICD-10-CM

## 2022-01-31 DIAGNOSIS — E1142 Type 2 diabetes mellitus with diabetic polyneuropathy: Secondary | ICD-10-CM

## 2022-01-31 DIAGNOSIS — D582 Other hemoglobinopathies: Secondary | ICD-10-CM | POA: Insufficient documentation

## 2022-01-31 DIAGNOSIS — E119 Type 2 diabetes mellitus without complications: Secondary | ICD-10-CM

## 2022-01-31 DIAGNOSIS — Z7984 Long term (current) use of oral hypoglycemic drugs: Secondary | ICD-10-CM

## 2022-01-31 LAB — GLUCOSE, CAPILLARY: Glucose-Capillary: 158 mg/dL — ABNORMAL HIGH (ref 70–99)

## 2022-01-31 LAB — POCT GLYCOSYLATED HEMOGLOBIN (HGB A1C): Hemoglobin A1C: 7.7 % — AB (ref 4.0–5.6)

## 2022-01-31 MED ORDER — EMPAGLIFLOZIN 10 MG PO TABS: 10.0000 mg | ORAL_TABLET | Freq: Every day | ORAL | 1 refills | Status: DC

## 2022-01-31 MED ORDER — GABAPENTIN 300 MG PO CAPS: 300.0000 mg | ORAL_CAPSULE | Freq: Every day | ORAL | 2 refills | Status: DC

## 2022-01-31 NOTE — Assessment & Plan Note (Signed)
Patient is following up on type 2 DM today. Patient states that she has made signigficant life changes since she has last seen me in clinic. Patient reports that she has comepletly cut out  soda from her life and states that she cut out a lot of carbs. She states that she has been compliant with her medication and has been able to successfully up titrate her metformin to '1000mg'$  BID.  Patient reports that she has been much more cognizant of what she puts in her body.  She denies any GI side effects from metformin.  She has no concerns at this time.  She states that her polydipsia and polyuria have subsided.  On exam, patient has good sensation to her bilateral lower extremities and is able to distinguish between sharp and dull sensations.  Patient does not have any wound to her bilateral lower extremities.  Strong pulses noted.  Patient's A1c today is 7.7, which is decreased from 12.9 back in July 2023.  Metformin 1000 mg twice daily as well as lifestyle modifications have proven effective.  Goal A1c is 7.0 or less, plan will be to start Jardiance 10 mg today.   Plan: - Patient to follow-up in 3 months for repeat A1c - Continue metformin 1000 mg twice daily -Continue to make lifestyle modifications -Add Jardiance 10 mg daily -Foot exam updated today -Ophthalmology referral made for dilated retinal exam

## 2022-01-31 NOTE — Assessment & Plan Note (Addendum)
Patient has been taking Crestor 40 mg daily for the past 3 to 4 months.  Patient denies any myalgias.  Patient reports adherence to her medication regimen.  Patient is also made lifestyle modifications by limiting fatty greasy foods.  Plan for lipid panel today.  Plan: -Lipid panel pending -Continue Crestor 40 mg daily  Addendum: -Lipid panel showing decreased cholesterol with lifestyle management and Crestor 40 mg daily.  Total cholesterol down to 90 from 396.  Triglycerides down to 159 from 203.  LDL down to 29 from 299.  Decrease Crestor to 20 mg daily.

## 2022-01-31 NOTE — Patient Instructions (Addendum)
Litsy, Epting you for allowing me to take part in your care today.  Here are your instructions.  1.  Regarding your diabetes, your A1c today was 7.7.  I want you to continue doing your lifestyle modifications and metformin 1000 mg twice daily.  This has shown to be effective.  The goal for all of our diabetics is to be less than 7.0.  You are close to goal.  I want to add Jardiance 10 mg daily to your regimen.  This medication will help you decrease your blood sugar by eliminating it through your urine.  This could make you more prone to urinary tract infections, which we can address if you do develop one.  Please continue taking metformin 1000 mg twice daily as well as start taking Jardiance 10 mg daily.  I have sent this to your mail order pharmacy. . 2.  Regarding your burning sensation in your legs, I want to check a vitamin B12 level.  I think this could be related to diabetes, but I want to rule out any B12 deficiency.  Please await for your lab results via phone call.  I have also started gabapentin 300 mg at bedtime for this.  Please start taking gabapentin at nighttime.   3.  I have referred you to ophthalmology for dilated eye exam.  4.  Please schedule appointment next month for Pap smear  5.  Please follow-up in 3 months for A1c check  6.  Please start checking your feet daily to inspect for any wounds.  7.  Regarding your blood counts, I have rechecked your blood counts today given last time, you could have been dehydrated and this may have elevated your hemoglobin slightly.  Please await phone call for results.  Thank you, Dr. Posey Pronto  If you have any other questions please contact the internal medicine clinic at [phone]

## 2022-01-31 NOTE — Assessment & Plan Note (Signed)
Patient had CBC previously showing elevated hemoglobin of 16.5 and hematocrit of 48.7 this could be due to the fact that previously patient had polydipsia and polyuria and patient could have been dehydrated.  Could be concern for polycythemia work-up, but will obtain CBC today to see if numbers resolved.  Plan: -CBC pending

## 2022-01-31 NOTE — Assessment & Plan Note (Addendum)
Patient is still concerned about numbness and tingling to her bilateral lower extremities.  She states that it is worse at nighttime and causes her trouble sleeping at times.  She denies any other concerns regarding this.  Patient does report that she has taken B12 supplements in the past.  Current neuropathy could be secondary to B12 deficiency versus diabetic neuropathy.  We will evaluate with B12 level today.  We will also start gabapentin 300 mg.  Plan: -Start gabapentin 300 mg at bedtime -B12 level pending  Addendum: -B12 level noncontributory as it is elevated at 1600. -This is likely diabetic neuropathy, continue gabapentin

## 2022-01-31 NOTE — Progress Notes (Signed)
CC: Diabetes follow-up  HPI:  [name] is a 66 y.o. female with a past medical history of diabetes and hyperlipidemia presenting for diabetes follow-up.  Please see assessment and plan for full HPI.  Past Medical History:  Diagnosis Date   Diabetes ([place]) 09/2021   Hip pain 2021   Hyperlipidemia      Current Outpatient Medications:    empagliflozin (JARDIANCE) 10 MG TABS tablet, Take 1 tablet (10 mg total) by mouth daily before breakfast., Disp: 90 tablet, Rfl: 1   gabapentin (NEURONTIN) 300 MG capsule, Take 1 capsule (300 mg total) by mouth at bedtime., Disp: 90 capsule, Rfl: 2   Cyanocobalamin (VITAMIN B 12 PO), Take by mouth daily. One tablet, Disp: , Rfl:    metFORMIN (GLUCOPHAGE-XR) 500 MG 24 hr tablet, Take 2 tablets (1,000 mg total) by mouth 2 (two) times daily with a meal., Disp: 14 tablet, Rfl: 1   Multiple Vitamins-Minerals (MULTIVITAMIN ADULTS PO), Take by mouth daily. Women over 50, Disp: , Rfl:    rosuvastatin (CRESTOR) 40 MG tablet, Take 1 tablet (40 mg total) by mouth daily., Disp: 90 tablet, Rfl: 3  Review of Systems:    Neuro: Patient endorses bilateral lower extremity numbness and tingling  Physical Exam:  Vitals:   01/31/22 0932  BP: 94/68  Pulse: (!) 120  Weight: 116 lb 1.6 oz (52.7 kg)    General: Patient is sitting comfortably in the room  Head: Normocephalic, atraumatic  Neck: Supple, nontender, full range of motion, No JVD Cardio: Regular rate and rhythm, no murmurs, rubs or gallops. 2+ pulses to bilateral upper and lower extremities  Pulmonary: Clear to ausculation bilaterally with no rales, rhonchi, and crackles  Neuro: Bilateral lower extremities with sensation intact with ability to distinguish between dull and sharp stimuli. Skin: No wounds noted to bilateral lower extremities   Assessment & Plan:   Diabetes mellitus type 2 in nonobese [facility]) Patient is following up on type 2 DM today. Patient states that she has made  signigficant life changes since she has last seen me in clinic. Patient reports that she has comepletly cut out  soda from her life and states that she cut out a lot of carbs. She states that she has been compliant with her medication and has been able to successfully up titrate her metformin to '1000mg'$  BID.  Patient reports that she has been much more cognizant of what she puts in her body.  She denies any GI side effects from metformin.  She has no concerns at this time.  She states that her polydipsia and polyuria have subsided.  On exam, patient has good sensation to her bilateral lower extremities and is able to distinguish between sharp and dull sensations.  Patient does not have any wound to her bilateral lower extremities.  Strong pulses noted.  Patient's A1c today is 7.7, which is decreased from 12.9 back in July 2023.  Metformin 1000 mg twice daily as well as lifestyle modifications have proven effective.  Goal A1c is 7.0 or less, plan will be to start Jardiance 10 mg today.   Plan: - Patient to follow-up in 3 months for repeat A1c - Continue metformin 1000 mg twice daily -Continue to make lifestyle modifications -Add Jardiance 10 mg daily -Foot exam updated today -Ophthalmology referral made for dilated retinal exam    Mixed hyperlipidemia Patient has been taking Crestor 40 mg daily for the past 3 to 4 months.  Patient denies any myalgias.  Patient reports adherence  to her medication regimen.  Patient is also made lifestyle modifications by limiting fatty greasy foods.  Plan for lipid panel today.  Plan: -Lipid panel pending -Continue Crestor 40 mg daily  Distal symmetric polyneuropathy Patient is still concerned about numbness and tingling to her bilateral lower extremities.  She states that it is worse at nighttime and causes her trouble sleeping at times.  She denies any other concerns regarding this.  Patient does report that she has taken B12 supplements in the past.  Current  neuropathy could be secondary to B12 deficiency versus diabetic neuropathy.  We will evaluate with B12 level today.  We will also start gabapentin 300 mg.  Plan: -Start gabapentin 300 mg at bedtime -B12 level pending  Elevated hemoglobin ([place]) Patient had CBC previously showing elevated hemoglobin of 16.5 and hematocrit of 48.7 this could be due to the fact that previously patient had polydipsia and polyuria and patient could have been dehydrated.  Could be concern for polycythemia work-up, but will obtain CBC today to see if numbers resolved.  Plan: -CBC pending   Patient seen with Dr. Alyse Low, DO PGY-1 Internal Medicine Resident  Pager: [phone]

## 2022-02-01 LAB — CBC
Hematocrit: 39.3 % (ref 34.0–46.6)
Hemoglobin: 12.7 g/dL (ref 11.1–15.9)
MCH: 29.7 pg (ref 26.6–33.0)
MCHC: 32.3 g/dL (ref 31.5–35.7)
MCV: 92 fL (ref 79–97)
Platelets: 360 10*3/uL (ref 150–450)
RBC: 4.27 x10E6/uL (ref 3.77–5.28)
RDW: 12.1 % (ref 11.7–15.4)
WBC: 6.7 10*3/uL (ref 3.4–10.8)

## 2022-02-01 LAB — LIPID PANEL
Chol/HDL Ratio: 2.6 ratio (ref 0.0–4.4)
Cholesterol, Total: 90 mg/dL — ABNORMAL LOW (ref 100–199)
HDL: 34 mg/dL — ABNORMAL LOW (ref >39)
LDL Chol Calc (NIH): 29 mg/dL (ref 0–99)
Triglycerides: 159 mg/dL — ABNORMAL HIGH (ref 0–149)
VLDL Cholesterol Cal: 27 mg/dL (ref 5–40)

## 2022-02-01 LAB — VITAMIN B12: Vitamin B-12: 1673 pg/mL — ABNORMAL HIGH (ref 232–1245)

## 2022-02-01 NOTE — Progress Notes (Addendum)
Total cholesterol down from 396 to90.  Triglycerides down from 203 to 159.  LDL down from 299 to 29.  B12 elevated. CBC within normal limits.  Overall normal labs.  A1c was communicated to patient during appointment.  Rest of labs were communicated via phone call.  Patient is happy with her results.  No intervention at this point.

## 2022-02-02 MED ORDER — ROSUVASTATIN CALCIUM 20 MG PO TABS: 20.0000 mg | ORAL_TABLET | Freq: Every day | ORAL | 3 refills | Status: DC

## 2022-02-02 NOTE — Addendum Note (Signed)
Addended by: Leigh Aurora on: 02/02/2022 02:44 PM   Modules accepted: Orders

## 2022-02-07 NOTE — Progress Notes (Signed)
Internal Medicine Clinic Attending  I saw and evaluated the patient.  I personally confirmed the key portions of the history and exam documented by the resident  and I reviewed pertinent patient test results.  The assessment, diagnosis, and plan were formulated together and I agree with the documentation in the resident's note.  

## 2022-02-14 ENCOUNTER — Ambulatory Visit (AMBULATORY_SURGERY_CENTER): Payer: Self-pay

## 2022-02-14 VITALS — Ht 65.0 in | Wt 115.0 lb

## 2022-02-14 DIAGNOSIS — Z86010 Personal history of colonic polyps: Secondary | ICD-10-CM

## 2022-02-14 DIAGNOSIS — K6289 Other specified diseases of anus and rectum: Secondary | ICD-10-CM

## 2022-02-14 MED ORDER — NA SULFATE-K SULFATE-MG SULF 17.5-3.13-1.6 GM/177ML PO SOLN: 1.0000 | Freq: Once | ORAL | 0 refills | Status: AC

## 2022-02-14 NOTE — Progress Notes (Signed)

## 2022-02-22 ENCOUNTER — Encounter (HOSPITAL_COMMUNITY): Payer: Self-pay | Admitting: Gastroenterology

## 2022-02-22 NOTE — Progress Notes (Signed)
Attempted to obtain medical history via telephone, unable to reach at this time. HIPAA compliant voicemail message left requesting return call to pre surgical testing department. 

## 2022-02-23 ENCOUNTER — Encounter: Payer: Self-pay | Admitting: Gastroenterology

## 2022-02-28 ENCOUNTER — Ambulatory Visit (INDEPENDENT_AMBULATORY_CARE_PROVIDER_SITE_OTHER): Payer: 59 | Admitting: Surgical

## 2022-02-28 ENCOUNTER — Ambulatory Visit: Payer: Self-pay

## 2022-02-28 DIAGNOSIS — M19011 Primary osteoarthritis, right shoulder: Secondary | ICD-10-CM

## 2022-02-28 DIAGNOSIS — M25511 Pain in right shoulder: Secondary | ICD-10-CM

## 2022-02-28 DIAGNOSIS — G8929 Other chronic pain: Secondary | ICD-10-CM

## 2022-02-28 NOTE — Anesthesia Preprocedure Evaluation (Signed)
Anesthesia Evaluation  Patient identified by MRN, date of birth, ID band Patient awake    Reviewed: Allergy & Precautions, NPO status , Patient's Chart, lab work & pertinent test results  Airway Mallampati: II  TM Distance: >3 FB Neck ROM: Full    Dental no notable dental hx. (+) Teeth Intact   Pulmonary former smoker   Pulmonary exam normal breath sounds clear to auscultation       Cardiovascular Exercise Tolerance: Good negative cardio ROS Normal cardiovascular exam Rhythm:Regular Rate:Normal     Neuro/Psych  negative psych ROS   GI/Hepatic negative GI ROS, Neg liver ROS,,,  Endo/Other  diabetes, Type 2    Renal/GU negative Renal ROS     Musculoskeletal   Abdominal   Peds  Hematology negative hematology ROS (+)   Anesthesia Other Findings   Reproductive/Obstetrics negative OB ROS                             Anesthesia Physical Anesthesia Plan  ASA: 3  Anesthesia Plan: MAC   Post-op Pain Management:    Induction: Intravenous  PONV Risk Score and Plan: Propofol infusion and Treatment may vary due to age or medical condition  Airway Management Planned: Natural Airway and Nasal Cannula  Additional Equipment: None  Intra-op Plan:   Post-operative Plan:   Informed Consent: I have reviewed the patients History and Physical, chart, labs and discussed the procedure including the risks, benefits and alternatives for the proposed anesthesia with the patient or authorized representative who has indicated his/her understanding and acceptance.     Interpreter used for [employer] Discussed with:   Anesthesia Plan Comments: (Hx of colon Polyp for removal colonoscopy)        Anesthesia Quick Evaluation

## 2022-03-01 ENCOUNTER — Other Ambulatory Visit: Payer: Self-pay

## 2022-03-01 ENCOUNTER — Ambulatory Visit (HOSPITAL_COMMUNITY)
Admission: RE | Admit: 2022-03-01 | Discharge: 2022-03-01 | Disposition: A | Discharge status: Home / Self Care | Payer: 59 | Attending: Gastroenterology | Admitting: Gastroenterology

## 2022-03-01 ENCOUNTER — Encounter (HOSPITAL_COMMUNITY)
Admission: RE | Disposition: A | Discharge status: Home / Self Care | Payer: Self-pay | Source: Home / Self Care | Attending: Gastroenterology

## 2022-03-01 ENCOUNTER — Ambulatory Visit (HOSPITAL_BASED_OUTPATIENT_CLINIC_OR_DEPARTMENT_OTHER): Payer: 59 | Admitting: Certified Registered Nurse Anesthetist

## 2022-03-01 ENCOUNTER — Ambulatory Visit (HOSPITAL_COMMUNITY): Payer: 59 | Admitting: Certified Registered Nurse Anesthetist

## 2022-03-01 ENCOUNTER — Encounter (HOSPITAL_COMMUNITY): Payer: Self-pay | Admitting: Gastroenterology

## 2022-03-01 DIAGNOSIS — Z7984 Long term (current) use of oral hypoglycemic drugs: Secondary | ICD-10-CM

## 2022-03-01 DIAGNOSIS — K5730 Diverticulosis of large intestine without perforation or abscess without bleeding: Secondary | ICD-10-CM | POA: Diagnosis not present

## 2022-03-01 DIAGNOSIS — K642 Third degree hemorrhoids: Secondary | ICD-10-CM

## 2022-03-01 DIAGNOSIS — E119 Type 2 diabetes mellitus without complications: Secondary | ICD-10-CM

## 2022-03-01 DIAGNOSIS — D128 Benign neoplasm of rectum: Secondary | ICD-10-CM

## 2022-03-01 DIAGNOSIS — Z87891 Personal history of nicotine dependence: Secondary | ICD-10-CM | POA: Insufficient documentation

## 2022-03-01 DIAGNOSIS — K648 Other hemorrhoids: Secondary | ICD-10-CM

## 2022-03-01 HISTORY — PX: COLONOSCOPY WITH PROPOFOL: SHX5780

## 2022-03-01 HISTORY — PX: SCLEROTHERAPY: SHX6841

## 2022-03-01 HISTORY — PX: ENDOSCOPIC MUCOSAL RESECTION: SHX6839

## 2022-03-01 LAB — GLUCOSE, CAPILLARY: Glucose-Capillary: 129 mg/dL — ABNORMAL HIGH (ref 70–99)

## 2022-03-01 SURGERY — COLONOSCOPY WITH PROPOFOL
Anesthesia: Monitor Anesthesia Care

## 2022-03-01 MED ORDER — PROPOFOL 1000 MG/100ML IV EMUL
INTRAVENOUS | Status: AC
  Filled 2022-03-01: qty 100

## 2022-03-01 MED ORDER — LIDOCAINE 2% (20 MG/ML) 5 ML SYRINGE
INTRAMUSCULAR | Status: DC | PRN
  Administered 2022-03-01: 40 mg via INTRAVENOUS

## 2022-03-01 MED ORDER — PROPOFOL 500 MG/50ML IV EMUL
INTRAVENOUS | Status: AC
  Filled 2022-03-01: qty 150

## 2022-03-01 MED ORDER — SODIUM CHLORIDE (PF) 0.9 % IJ SOLN
PREFILLED_SYRINGE | INTRAMUSCULAR | Status: DC | PRN
  Administered 2022-03-01: 5 mL

## 2022-03-01 MED ORDER — PROPOFOL 500 MG/50ML IV EMUL
INTRAVENOUS | Status: DC | PRN
  Administered 2022-03-01: 100 ug/kg/min via INTRAVENOUS

## 2022-03-01 MED ORDER — LACTATED RINGERS IV SOLN
INTRAVENOUS | Status: DC
  Administered 2022-03-01: 1000 mL via INTRAVENOUS

## 2022-03-01 MED ORDER — ONDANSETRON HCL 4 MG/2ML IJ SOLN
INTRAMUSCULAR | Status: DC | PRN
  Administered 2022-03-01: 4 mg via INTRAVENOUS

## 2022-03-01 MED ORDER — PROPOFOL 10 MG/ML IV BOLUS
INTRAVENOUS | Status: AC
  Filled 2022-03-01: qty 20

## 2022-03-01 MED ORDER — PROPOFOL 10 MG/ML IV BOLUS
INTRAVENOUS | Status: DC | PRN
  Administered 2022-03-01 (×2): 30 mg via INTRAVENOUS
  Administered 2022-03-01 (×3): 20 mg via INTRAVENOUS

## 2022-03-01 MED ORDER — PHENYLEPHRINE 80 MCG/ML (10ML) SYRINGE FOR IV PUSH (FOR BLOOD PRESSURE SUPPORT)
PREFILLED_SYRINGE | INTRAVENOUS | Status: DC | PRN
  Administered 2022-03-01 (×11): 80 ug via INTRAVENOUS

## 2022-03-01 SURGICAL SUPPLY — 22 items

## 2022-03-01 NOTE — Anesthesia Procedure Notes (Signed)
Procedure Name: MAC Date/Time: 03/01/2022 10:30 AM  Performed by: West Pugh, CRNAPre-anesthesia Checklist: Patient identified, Emergency Drugs available, Suction available, Patient being monitored and Timeout performed Patient Re-evaluated:Patient Re-evaluated prior to induction Oxygen Delivery Method: Simple face mask Preoxygenation: Pre-oxygenation with 100% oxygen Induction Type: IV induction Placement Confirmation: positive ETCO2 Dental Injury: Teeth and Oropharynx as per pre-operative assessment

## 2022-03-01 NOTE — H&P (Signed)
GASTROENTEROLOGY PROCEDURE H&P NOTE   Primary Care Physician: Leigh Aurora, DO    Reason for Procedure:  Endoscopic Mucosal Resection of known colon polyp  Plan:    Colonoscopy with EMR  Patient is appropriate for endoscopic procedure(s) at [facility] Endoscopy unit.  The nature of the procedure, as well as the risks, benefits, and alternatives were carefully and thoroughly reviewed with the patient. Ample time for discussion and questions allowed. The patient understood, was satisfied, and agreed to proceed.     HPI: Joann Campos is a 66 y.o. female who presents for colonoscopy for EMR of known colon polyp.  Underwent initial CRC screening colonoscopy on 01/04/2022 which was notable for 2 subcentimeter adenomas removed with cold snare, internal hemorrhoids, and a large 40 mm rectal polyp with thick stalk located 10-12 cm from the anal verge (biopsied: TVA).  She presents to [facility] Endoscopy unit today for resection of the known rectal polyp via advanced endoscopic technique.  She is otherwise without active GI symptoms.  Past Medical History:  Diagnosis Date   Diabetes ([place]) 09/2021   Hip pain 2021   Hyperlipidemia     Past Surgical History:  Procedure Laterality Date   BREAST REDUCTION SURGERY Bilateral    1990s   CHOLECYSTECTOMY      Prior to Admission medications   Medication Sig Start Date End Date Taking? Authorizing Provider  Cyanocobalamin (VITAMIN B 12 PO) Take 1 tablet by mouth daily. One tablet   Yes [provider]  empagliflozin (JARDIANCE) 10 MG TABS tablet Take 1 tablet (10 mg total) by mouth daily before breakfast. 01/31/22 07/30/22 Yes Patel, Amar, DO  gabapentin (NEURONTIN) 300 MG capsule Take 1 capsule (300 mg total) by mouth at bedtime. 01/31/22 01/31/23 Yes Leigh Aurora, DO  metFORMIN (GLUCOPHAGE-XR) 500 MG 24 hr tablet Take 2 tablets (1,000 mg total) by mouth 2 (two) times daily with a meal. 01/17/22  Yes Sanjuan Dame, MD   Multiple Vitamins-Minerals (MULTIVITAMIN ADULTS PO) Take 1 tablet by mouth daily. Women over 44   Yes [provider]  rosuvastatin (CRESTOR) 20 MG tablet Take 1 tablet (20 mg total) by mouth daily. 02/02/22 01/28/23 Yes Leigh Aurora, DO    No current facility-administered medications for this encounter.    Allergies as of 01/04/2022 - Review Complete 01/04/2022  Allergen Reaction Noted   No known allergies  01/04/2022    Family History  Problem Relation Age of Onset   Colon cancer Mother    Hypercholesterolemia Father    Diabetes Brother    CVA Brother    CVA Sibling    Crohn's disease Neg Hx    Esophageal cancer Neg Hx    Rectal cancer Neg Hx    Stomach cancer Neg Hx    Ulcerative colitis Neg Hx     Social History   Socioeconomic History   Marital status: Married    Spouse name: Not on file   Number of children: Not on file   Years of education: Not on file   Highest education level: Not on file  Occupational History   Not on file  Tobacco Use   Smoking status: Former    Passive exposure: Never   Smokeless tobacco: Never  Vaping Use   Vaping Use: Never used  Substance and Sexual Activity   Alcohol use: Never   Drug use: Never   Sexual activity: Not on file  Other Topics Concern   Not on file  Social History Narrative  Not on file   Social Determinants of Health   Financial Resource Strain: Not on file  Food Insecurity: Not on file  Transportation Needs: Not on file  Physical Activity: Not on file  Stress: Not on file  Social Connections: Not on file  Intimate Partner Violence: Not on file    Physical Exam: Vital signs in last 24 hours: '@There'$  were no vitals taken for this visit. GEN: NAD EYE: Sclerae anicteric ENT: MMM CV: Non-tachycardic Pulm: CTA b/l GI: Soft, NT/ND NEURO:  Alert & Oriented x 3   Gerrit Heck, DO [facility]   03/01/2022 9:30 AM

## 2022-03-01 NOTE — Discharge Instructions (Signed)

## 2022-03-01 NOTE — Transfer of Care (Signed)
Immediate Anesthesia Transfer of Care Note  Patient: Joann Campos  Procedure(s) Performed: COLONOSCOPY WITH PROPOFOL ENDOSCOPIC MUCOSAL RESECTION SCLEROTHERAPY  Patient Location: PACU and Endoscopy Unit  Anesthesia Type:MAC  Level of Consciousness: awake, alert , and patient cooperative  Airway & Oxygen Therapy: Patient Spontanous Breathing and Patient connected to nasal cannula oxygen  Post-op Assessment: Report given to RN and Post -op Vital signs reviewed and stable  Post vital signs: Reviewed and stable  Last Vitals:  Vitals Value Taken Time  BP 89/55 03/01/22 1206  Temp    Pulse 85 03/01/22 1206  Resp 12 03/01/22 1206  SpO2 98 % 03/01/22 1206  Vitals shown include unvalidated device data.  Last Pain:  Vitals:   03/01/22 0920  TempSrc: Temporal  PainSc: 0-No pain         Complications: No notable events documented.

## 2022-03-01 NOTE — Anesthesia Postprocedure Evaluation (Signed)
Anesthesia Post Note  Patient: Joann Campos  Procedure(s) Performed: COLONOSCOPY WITH PROPOFOL ENDOSCOPIC MUCOSAL RESECTION SCLEROTHERAPY     Patient location during evaluation: PACU Anesthesia Type: MAC Level of consciousness: awake and alert Pain management: pain level controlled Vital Signs Assessment: post-procedure vital signs reviewed and stable Respiratory status: spontaneous breathing, nonlabored ventilation, respiratory function stable and patient connected to nasal cannula oxygen Cardiovascular status: stable and blood pressure returned to baseline Postop Assessment: no apparent nausea or vomiting Anesthetic complications: no  No notable events documented.  Last Vitals:  Vitals:   03/01/22 1255 03/01/22 1300  BP:  135/70  Pulse: 89 81  Resp: 13 15  Temp:    SpO2: 97% 96%    Last Pain:  Vitals:   03/01/22 1300  TempSrc:   PainSc: 0-No pain                 Barnet Glasgow

## 2022-03-01 NOTE — Op Note (Signed)
[facility] Patient Name: Joann Campos Procedure Date: 03/01/2022 MRN: [id number] Attending MD: Gerrit Heck , MD, [id number] Date of Birth: [birth year] CSN: [id number] Age: 66 Admit Type: Outpatient Procedure:                Colonoscopy with EMR, submucosal injection, and                            biopsies Indications:              For therapy of adenomatous polyps in the rectum                           Initial CRC screening colonoscopy on 01/04/2022                            which was notable for 2 subcentimeter adenomas                            removed with cold snare, internal hemorrhoids, and                            a large 40 mm rectal polyp with thick stalk located                            10-12 cm from the anal verge (biopsied: TV[facility]) Providers:                Gerrit Heck, MD, Daine Gravel, RN, Cherylynn Ridges, Technician, Christell Faith, CRNA Referring MD:              Medicines:                Monitored Anesthesia Care Complications:            No immediate complications. Estimated Blood Loss:     Estimated blood loss was minimal. Procedure:                Pre-Anesthesia Assessment:                           - Prior to the procedure, a History and Physical                            was performed, and patient medications and                            allergies were reviewed. The patient's tolerance of                            previous anesthesia was also reviewed. The risks                            and benefits of the procedure and the sedation  options and risks were discussed with the patient.                            All questions were answered, and informed consent                            was obtained. Prior Anticoagulants: The patient has                            taken no anticoagulant or antiplatelet agents. ASA                            Grade Assessment: II - A patient  with mild systemic                            disease. After reviewing the risks and benefits,                            the patient was deemed in satisfactory condition to                            undergo the procedure.                           After obtaining informed consent, the colonoscope                            was passed under direct vision. Throughout the                            procedure, the patient's blood pressure, pulse, and                            oxygen saturations were monitored continuously. The                            CF-HQ190L ([id number] Olympus colonoscope was                            introduced through the anus and advanced to the the                            cecum, identified by appendiceal orifice and                            ileocecal valve. The colonoscopy was performed                            without difficulty. The patient tolerated the                            procedure well. The quality of the bowel  preparation was good. The ileocecal valve,                            appendiceal orifice, and rectum were photographed. Scope In: 10:40:17 AM Scope Out: 11:57:04 AM Scope Withdrawal Time: 1 hour 11 minutes 51 seconds  Total Procedure Duration: 1 hour 16 minutes 47 seconds  Findings:      Hemorrhoids were found on perianal exam.      A 50 mm polyp was found in the rectum. The polyp was pedunculated with a       thick stalk. Area was successfully injected with 4 mL of a 0.1 mg/mL       solution of epinephrine in preparation for EMR/polypectomy. An endoloop       was maneuvered over the polyp stalk and closed at the mucosal attachment       prior to removal in order to prevent bleeding. The polyp was removed       with a piecemeal technique using a hot snare. Resection and retrieval       were complete using suction and rescue net. The edges and and any       overlapping adenomatous appearing tissue was then  resected with cold       forceps via avulsion technique. Estimated blood loss was minimal.      Non-bleeding internal hemorrhoids were found during retroflexion. The       hemorrhoids were medium-sized. Impression:               - Hemorrhoids found on perianal exam.                           - One 50 mm polyp in the rectum, removed via                            piecemeal technique using a combination of                            epinephrine, endoloop, hot snare, and forceps as                            above. Resected and retrieved.                           - The remainder of the colon was normal appearing.                           - Non-bleeding internal hemorrhoids. Moderate Sedation:      Not Applicable - Patient had care per Anesthesia. Recommendation:           - Patient has a contact number available for                            emergencies. The signs and symptoms of potential                            delayed complications were discussed with the  patient. Return to normal activities tomorrow.                            Written discharge instructions were provided to the                            patient.                           - Resume previous diet today.                           - Continue present medications.                           - Await pathology results.                           - Repeat colonoscopy in 6 months for surveillance.                            This should be scheduled at [facility]                            Endoscopy Unit.                           - Return to GI clinic in 3 months, or sooner as                            needed. Procedure Code(s):        --- Professional ---                           [phone], Colonoscopy, flexible; with removal of                            tumor(s), polyp(s), or other lesion(s) by snare                            technique                           45381, Colonoscopy,  flexible; with directed                            submucosal injection(s), any substance Diagnosis Code(s):        --- Professional ---                           D12.8, Benign neoplasm of rectum                           K64.8, Other hemorrhoids                           K57.30, Diverticulosis of large intestine without  perforation or abscess without bleeding CPT copyright 2022 [employer]. All rights reserved. The codes documented in this report are preliminary and upon coder review may  be revised to meet current compliance requirements. Gerrit Heck, MD 03/01/2022 12:23:34 PM Number of Addenda: 0

## 2022-03-01 NOTE — Interval H&P Note (Signed)
History and Physical Interval Note:  03/01/2022 9:33 AM  Joann Campos  has presented today for surgery, with the diagnosis of polyp removal.  The various methods of treatment have been discussed with the patient and family. After consideration of risks, benefits and other options for treatment, the patient has consented to  Procedure(s): COLONOSCOPY WITH PROPOFOL (N/A) ENDOSCOPIC MUCOSAL RESECTION (N/A) as a surgical intervention.  The patient's history has been reviewed, patient examined, no change in status, stable for surgery.  I have reviewed the patient's chart and labs.  Questions were answered to the patient's satisfaction.     Dominic Pea [name]

## 2022-03-03 LAB — SURGICAL PATHOLOGY

## 2022-03-06 ENCOUNTER — Encounter: Payer: Self-pay | Admitting: Surgical

## 2022-03-06 MED ORDER — LIDOCAINE HCL 1 % IJ SOLN
5.0000 mL | INTRAMUSCULAR | Status: AC | PRN
  Administered 2022-02-28: 5 mL

## 2022-03-06 MED ORDER — METHYLPREDNISOLONE ACETATE 40 MG/ML IJ SUSP
40.0000 mg | INTRAMUSCULAR | Status: AC | PRN
  Administered 2022-02-28: 40 mg via INTRA_ARTICULAR

## 2022-03-06 MED ORDER — BUPIVACAINE HCL 0.5 % IJ SOLN
9.0000 mL | INTRAMUSCULAR | Status: AC | PRN
  Administered 2022-02-28: 9 mL via INTRA_ARTICULAR

## 2022-03-06 NOTE — Progress Notes (Signed)
Office Visit Note   Patient: Joann Campos           Date of Birth: 1956/02/28           MRN: [id number] Visit Date: 02/28/2022 Requested by: Leigh Aurora, DO [address] [place],  [place] [address] PCP: Leigh Aurora, DO  Subjective: Chief Complaint  Patient presents with   Right Shoulder - Pain    HPI: Joann Campos is a 66 y.o. female who presents to the office reporting right shoulder pain.  Patient states that her previous injection with Dr. Ernestina Patches in the glenohumeral joint gave her excellent relief up until several weeks ago.  This was done in the summertime.  She is having increased pain in the right shoulder with reduced range of motion.  She has prior MRI in June 2023 demonstrating possible interstitial tear of the bicep long head tendon with mild to moderate degenerative changes of the glenohumeral joint without full-thickness rotator cuff tear.  No new injury.  Localizes majority of her pain to the lateral aspect of the shoulder without much in the way of radiation or numbness/tingling..                ROS: All systems reviewed are negative as they relate to the chief complaint within the history of present illness.  Patient denies fevers or chills.  Assessment & Plan: Visit Diagnoses:  1. Glenohumeral arthritis, right   2. Chronic right shoulder pain     Plan: Patient is a 66 year old female who presents for evaluation of right shoulder pain.  She has history of right shoulder mild to moderate degenerative changes of the glenohumeral joint as well as interstitial tearing of the bicep tendon possibly.  No full-thickness rotator cuff tear.  She has had great relief with prior glenohumeral cortisone injections.  She would like to repeat this today.  Glenohumeral junction was administered under ultrasound guidance with confirmation of the needle placement into the glenohumeral joint and extravasation of the injection noted into the joint space.  We will plan on seeing how this  does for her and if she would like to repeat this in the future we can do this.  If she has not as much relief from this injection and would prefer to have fluoroscopic guided injection with Dr. Ernestina Patches in the future, she may just call our office and we can set that up for her.  Patient understands and agrees with plan.  Follow-up with the office as needed.  Follow-Up Instructions: No follow-ups on file.   Orders:  Orders Placed This Encounter  Procedures   US Guided Needle Placement - No Linked Charges   No orders of the defined types were placed in this encounter.     Procedures: Large Joint Inj: R glenohumeral on 02/28/2022 11:18 AM Indications: diagnostic evaluation and pain Details: 22 G 1.5 in and 3.5 in needle, ultrasound-guided posterior approach  Arthrogram: No  Medications: 9 mL bupivacaine 0.5 %; 40 mg methylPREDNISolone acetate 40 MG/ML; 5 mL lidocaine 1 % Outcome: tolerated well, no immediate complications Procedure, treatment alternatives, risks and benefits explained, specific risks discussed. Consent was given by the patient. Immediately prior to procedure a time out was called to verify the correct patient, procedure, equipment, support staff and site/side marked as required. Patient was prepped and draped in the usual sterile fashion.       Clinical Data: No additional findings.  Objective: Vital Signs: There were no vitals taken for this visit.  Physical  Exam:  Constitutional: Patient appears well-developed HEENT:  Head: Normocephalic Eyes:EOM are normal Neck: Normal range of motion Cardiovascular: Normal rate Pulmonary/chest: Effort normal Neurologic: Patient is alert Skin: Skin is warm Psychiatric: Patient has normal mood and affect  Ortho Exam: Ortho exam demonstrates right shoulder with 30 degrees X rotation, 60 degrees abduction, 110 degrees forward flexion.  She has excellent rotator cuff strength of supra, infra, subscap.  She has reproduction of  pain with terminal range of motion.  Axillary nerve intact with deltoid firing.  2+ radial pulse of the right upper extremity.  No cellulitis or skin changes noted.  Specialty Comments:  No specialty comments available.  Imaging: No results found.   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   Healthcare maintenance 09/29/2021   History of iritis 09/29/2021   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   Past Medical History:  Diagnosis Date   Diabetes ([place]) 09/2021   Hip pain 2021   Hyperlipidemia     Family History  Problem Relation Age of Onset   Colon cancer Mother    Hypercholesterolemia Father    Diabetes Brother    CVA Brother    CVA Sibling    Crohn's disease Neg Hx    Esophageal cancer Neg Hx    Rectal cancer Neg Hx    Stomach cancer Neg Hx    Ulcerative colitis Neg Hx     Past Surgical History:  Procedure Laterality Date   BREAST REDUCTION SURGERY Bilateral    1990s   CHOLECYSTECTOMY     Social History   Occupational History   Not on file  Tobacco Use   Smoking status: Former    Passive exposure: Never   Smokeless tobacco: Never  Vaping Use   Vaping Use: Never used  Substance and Sexual Activity   Alcohol use: Never   Drug use: Never   Sexual activity: Not on file

## 2022-06-30 ENCOUNTER — Other Ambulatory Visit: Payer: Self-pay

## 2022-06-30 ENCOUNTER — Ambulatory Visit: Payer: 59 | Admitting: Surgical

## 2022-06-30 ENCOUNTER — Encounter: Payer: Self-pay | Admitting: Surgical

## 2022-06-30 DIAGNOSIS — G8929 Other chronic pain: Secondary | ICD-10-CM | POA: Diagnosis not present

## 2022-06-30 DIAGNOSIS — M19011 Primary osteoarthritis, right shoulder: Secondary | ICD-10-CM

## 2022-06-30 DIAGNOSIS — M25511 Pain in right shoulder: Secondary | ICD-10-CM | POA: Diagnosis not present

## 2022-06-30 MED ORDER — BUPIVACAINE HCL 0.5 % IJ SOLN
9.0000 mL | INTRAMUSCULAR | Status: AC | PRN
  Administered 2022-06-30: 9 mL via INTRA_ARTICULAR

## 2022-06-30 MED ORDER — LIDOCAINE HCL 1 % IJ SOLN
5.0000 mL | INTRAMUSCULAR | Status: AC | PRN
  Administered 2022-06-30: 5 mL

## 2022-06-30 MED ORDER — METHYLPREDNISOLONE ACETATE 40 MG/ML IJ SUSP
40.0000 mg | INTRAMUSCULAR | Status: AC | PRN
  Administered 2022-06-30: 40 mg via INTRA_ARTICULAR

## 2022-06-30 NOTE — Progress Notes (Signed)
Follow-up Office Visit Note   Patient: Joann Campos           Date of Birth: Jan 08, 1956           MRN: [id number] Visit Date: 06/30/2022 Requested by: Modena Slater, DO [address] [place],  [place] [address] PCP: Modena Slater, DO  Subjective: Chief Complaint  Patient presents with   Right Shoulder - Pain    HPI: Joann Campos is a 67 y.o. female who returns to the office for follow-up visit.    Plan at last visit was: Patient is a 67 year old female who presents for evaluation of right shoulder pain.  She has history of right shoulder mild to moderate degenerative changes of the glenohumeral joint as well as interstitial tearing of the bicep tendon possibly.  No full-thickness rotator cuff tear.  She has had great relief with prior glenohumeral cortisone injections.  She would like to repeat this today.  Glenohumeral junction was administered under ultrasound guidance with confirmation of the needle placement into the glenohumeral joint and extravasation of the injection noted into the joint space.  We will plan on seeing how this does for her and if she would like to repeat this in the future we can do this.  If she has not as much relief from this injection and would prefer to have fluoroscopic guided injection with Dr. Alvester Morin in the future, she may just call our office and we can set that up for her.  Patient understands and agrees with plan.  Follow-up with the office as needed.   Since then, patient notes she has right shoulder pain that has been worsening over the last 3 weeks without any history of recent injury.  Pain is come back.  Mostly bothering her diffusely throughout the lateral aspect of the right shoulder with radiation down to the elbow primarily.  Does not really have any significant amount of radiation past the elbow.  No numbness or tingling, scapular pain, neck pain.  She had good relief from prior glenohumeral injection on 02/28/22.  She is right-hand dominant.  Not  really wanting to think about surgery.              ROS: All systems reviewed are negative as they relate to the chief complaint within the history of present illness.  Patient denies fevers or chills.  Assessment & Plan: Visit Diagnoses:  1. Chronic right shoulder pain     Plan: Joann Campos is a 67 y.o. female who returns to the office for follow-up visit for right shoulder glenohumeral arthritis.  Plan from last visit was noted above in HPI.  They now return with return of pain after good relief from injection on 02/28/2022.  We discussed the options available to patient and she would like to repeat injection today under ultrasound guidance.  This was successfully administered and patient tolerated procedure well.  We will see how this does for her.  She can do this injection every 3 to 4 months as needed.  Follow-up as needed.  Follow-Up Instructions: No follow-ups on file.   Orders:  Orders Placed This Encounter  Procedures   US Guided Needle Placement - No Linked Charges   No orders of the defined types were placed in this encounter.     Procedures: Large Joint Inj: R glenohumeral on 06/30/2022 3:31 PM Indications: diagnostic evaluation and pain Details: 22 G 1.5 in and 3.5 in needle, ultrasound-guided posterior approach  Arthrogram: No  Medications: 9 mL bupivacaine  0.5 %; 40 mg methylPREDNISolone acetate 40 MG/ML; 5 mL lidocaine 1 % Outcome: tolerated well, no immediate complications Procedure, treatment alternatives, risks and benefits explained, specific risks discussed. Consent was given by the patient. Immediately prior to procedure a time out was called to verify the correct patient, procedure, equipment, support staff and site/side marked as required. Patient was prepped and draped in the usual sterile fashion.       Clinical Data: No additional findings.  Objective: Vital Signs: There were no vitals taken for this visit.  Physical Exam:  Constitutional:  Patient appears well-developed HEENT:  Head: Normocephalic Eyes:EOM are normal Neck: Normal range of motion Cardiovascular: Normal rate Pulmonary/chest: Effort normal Neurologic: Patient is alert Skin: Skin is warm Psychiatric: Patient has normal mood and affect  Ortho Exam: Ortho exam demonstrates right shoulder with 25 degrees external rotation, 70 degrees abduction, 110 degrees forward elevation passively and actively.  Excellent rotator cuff strength of supra, infra, subscap though subscapularis strength testing does reproduce pain severely.  She has tenderness over the bicipital groove moderately.  No tenderness over the [facility] joint or acromion.  No cellulitis or skin changes noted.  Axillary nerve intact with deltoid firing.  Specialty Comments:  No specialty comments available.  Imaging: No results found.   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   Healthcare maintenance 09/29/2021   History of iritis 09/29/2021   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   Past Medical History:  Diagnosis Date   Diabetes (HCC) 09/2021   Hip pain 2021   Hyperlipidemia     Family History  Problem Relation Age of Onset   Colon cancer Mother    Hypercholesterolemia Father    Diabetes Brother    CVA Brother    CVA Sibling    Crohn's disease Neg Hx    Esophageal cancer Neg Hx    Rectal cancer Neg Hx    Stomach cancer Neg Hx    Ulcerative colitis Neg Hx     Past Surgical History:  Procedure Laterality Date   BREAST REDUCTION SURGERY Bilateral    1990s   CHOLECYSTECTOMY     COLONOSCOPY WITH PROPOFOL N/A 03/01/2022   Procedure: COLONOSCOPY WITH PROPOFOL;  Surgeon: Shellia Cleverly, DO;  Location: [facility] ENDOSCOPY;  Service: Gastroenterology;   Laterality: N/A;   ENDOSCOPIC MUCOSAL RESECTION N/A 03/01/2022   Procedure: ENDOSCOPIC MUCOSAL RESECTION;  Surgeon: Shellia Cleverly, DO;  Location: [facility] ENDOSCOPY;  Service: Gastroenterology;  Laterality: N/A;   SCLEROTHERAPY  03/01/2022   Procedure: SCLEROTHERAPY;  Surgeon: Shellia Cleverly, DO;  Location: [facility] ENDOSCOPY;  Service: Gastroenterology;;   Social History   Occupational History   Not on file  Tobacco Use   Smoking status: Former    Passive exposure: Never   Smokeless tobacco: Never  Vaping Use   Vaping Use: Never used  Substance and Sexual Activity   Alcohol use: Never   Drug use: Never   Sexual activity: Not on file

## 2022-07-01 ENCOUNTER — Ambulatory Visit: Payer: 59 | Admitting: Surgical

## 2022-07-07 ENCOUNTER — Other Ambulatory Visit: Payer: Self-pay | Admitting: Student

## 2022-07-07 DIAGNOSIS — E119 Type 2 diabetes mellitus without complications: Secondary | ICD-10-CM

## 2022-07-07 NOTE — Telephone Encounter (Signed)
Future appointment scheduled.  Patient sent message via my chart to make them aware.

## 2022-08-03 LAB — HM DIABETES EYE EXAM

## 2022-08-05 ENCOUNTER — Other Ambulatory Visit: Payer: Self-pay

## 2022-08-05 ENCOUNTER — Encounter: Payer: Self-pay | Admitting: Student

## 2022-08-05 ENCOUNTER — Telehealth: Payer: Self-pay | Admitting: Gastroenterology

## 2022-08-05 ENCOUNTER — Ambulatory Visit (INDEPENDENT_AMBULATORY_CARE_PROVIDER_SITE_OTHER): Payer: 59 | Admitting: Student

## 2022-08-05 ENCOUNTER — Other Ambulatory Visit: Payer: Self-pay | Admitting: Student

## 2022-08-05 VITALS — BP 93/63 | HR 86 | Temp 98.0°F | Ht 65.0 in | Wt 121.0 lb

## 2022-08-05 DIAGNOSIS — E1169 Type 2 diabetes mellitus with other specified complication: Secondary | ICD-10-CM

## 2022-08-05 DIAGNOSIS — D128 Benign neoplasm of rectum: Secondary | ICD-10-CM

## 2022-08-05 DIAGNOSIS — Z7984 Long term (current) use of oral hypoglycemic drugs: Secondary | ICD-10-CM

## 2022-08-05 DIAGNOSIS — G6289 Other specified polyneuropathies: Secondary | ICD-10-CM

## 2022-08-05 DIAGNOSIS — F32A Depression, unspecified: Secondary | ICD-10-CM

## 2022-08-05 DIAGNOSIS — E1142 Type 2 diabetes mellitus with diabetic polyneuropathy: Secondary | ICD-10-CM

## 2022-08-05 DIAGNOSIS — E119 Type 2 diabetes mellitus without complications: Secondary | ICD-10-CM

## 2022-08-05 DIAGNOSIS — E785 Hyperlipidemia, unspecified: Secondary | ICD-10-CM | POA: Diagnosis not present

## 2022-08-05 DIAGNOSIS — Z1231 Encounter for screening mammogram for malignant neoplasm of breast: Secondary | ICD-10-CM

## 2022-08-05 DIAGNOSIS — Z124 Encounter for screening for malignant neoplasm of cervix: Secondary | ICD-10-CM

## 2022-08-05 DIAGNOSIS — I959 Hypotension, unspecified: Secondary | ICD-10-CM

## 2022-08-05 LAB — GLUCOSE, CAPILLARY: Glucose-Capillary: 127 mg/dL — ABNORMAL HIGH (ref 70–99)

## 2022-08-05 LAB — POCT GLYCOSYLATED HEMOGLOBIN (HGB A1C): Hemoglobin A1C: 7.6 % — AB (ref 4.0–5.6)

## 2022-08-05 MED ORDER — FLUOXETINE HCL 10 MG PO CAPS: 10.0000 mg | ORAL_CAPSULE | Freq: Every day | ORAL | 1 refills | Status: DC

## 2022-08-05 MED ORDER — METFORMIN HCL ER 500 MG PO TB24: 1000.0000 mg | ORAL_TABLET | Freq: Two times a day (BID) | ORAL | 3 refills | Status: DC

## 2022-08-05 MED ORDER — GABAPENTIN 300 MG PO CAPS: 600.0000 mg | ORAL_CAPSULE | Freq: Every day | ORAL | 2 refills | Status: DC

## 2022-08-05 MED ORDER — EMPAGLIFLOZIN 25 MG PO TABS: 25.0000 mg | ORAL_TABLET | Freq: Every day | ORAL | 1 refills | Status: DC

## 2022-08-05 NOTE — Progress Notes (Signed)
CC: Diabetes follow-up  HPI:  [name] is a 67 y.o. female with past medical history of type 2 diabetes and hyperlipidemia who presents for A1c check and depressed mood.  Please see assessment and plan for full HPI. Past Medical History:  Diagnosis Date   Diabetes (HCC) 09/2021   Hip pain 2021   Hyperlipidemia      Current Outpatient Medications:    empagliflozin (JARDIANCE) 25 MG TABS tablet, Take 1 tablet (25 mg total) by mouth daily before breakfast., Disp: 90 tablet, Rfl: 1   FLUoxetine (PROZAC) 10 MG capsule, Take 1 capsule (10 mg total) by mouth daily., Disp: 90 capsule, Rfl: 1   Cyanocobalamin (VITAMIN B 12 PO), Take 1 tablet by mouth daily. One tablet, Disp: , Rfl:    gabapentin (NEURONTIN) 300 MG capsule, Take 2 capsules (600 mg total) by mouth at bedtime., Disp: 90 capsule, Rfl: 2   metFORMIN (GLUCOPHAGE-XR) 500 MG 24 hr tablet, Take 2 tablets (1,000 mg total) by mouth 2 (two) times daily with a meal., Disp: 360 tablet, Rfl: 3   Multiple Vitamins-Minerals (MULTIVITAMIN ADULTS PO), Take 1 tablet by mouth daily. Women over 50, Disp: , Rfl:    rosuvastatin (CRESTOR) 20 MG tablet, Take 1 tablet (20 mg total) by mouth daily., Disp: 90 tablet, Rfl: 3  Review of Systems:    Psych: Patient endorses depressed mood  Physical Exam:  Vitals:   08/05/22 0926 08/05/22 0959  BP: 94/62 93/63  Pulse: 92 86  Temp: 98 F (36.7 C)   TempSrc: Oral   SpO2: 100%   Weight: 121 lb (54.9 kg)   Height: 5\' 5"  (1.651 m)     General: Patient is sitting comfortably in the room  Head: Normocephalic, atraumatic  Cardio: Regular rate and rhythm, no murmurs, rubs or gallops. 2+ pulses to bilateral upper and lower extremities  Pulmonary: Clear to ausculation bilaterally with no rales, rhonchi, and crackles  Extremities: Bilateral lower extremities with intact sensation, no obvious wounds noted, 2+ pedal pulses   Assessment & Plan:   Type 2 diabetes mellitus with hyperlipidemia  [facility]) Patient presents for type 2 diabetes follow-up.  Patient states she has made life changes, and is compliant with her medications.  She has no other concerns today.  She states that she is trying to exercise and eat right.  A1c today is 7.6, down from 7.7 back in November 2023.  It did not go up, but did not come down to less than 7.  This is not at goal.  Current medication includes metformin 1000 mg daily and Jardiance 10 mg daily.  Given A1c is not at goal, will increase Jardiance today.  I tried to get patient to give urine today for urine microalbumin/creatinine ratio, but patient was unable to void.  Plan: -Increase Jardiance to 25 mg daily -Continue metformin 1000 mg twice daily -Obtain urine microalbumin/creatinine ratio at next visit -Foot exam updated today  Diabetic neuropathy (HCC) Patient states that the gabapentin has been helping, but otherwise at night, her burning sensations come back.  She states she would like to potentially try to go up on her gabapentin.  Ultimately strict glucose control would be the answer here.  Foot exam today showing no obvious sensation losses, or wounds.  Plan: -Increase gabapentin to 600 mg nightly -Goal will be for strict glucose control  Screening for cervical cancer Plan for Pap smear at next visit.  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  Patient is not scheduled for colonoscopy upon my visit today.  I encourage patient to reach out to her gastroenterologist to get this scheduled which she states she will.  Plan: -Patient to reach out to gastroenterology for repeat colonoscopy as she is due for 1 now  Depression Patient endorses depressed mood for "awhile" now.  She states she has no stressors.  She states she has lost interest in things that she did before such as hiking, going to  the movies, going out to dinner with her friends.  She denies any anxiety, anger, or crying spells.  She states she sometimes just wants to lay in bed all day.  She denies any suicidal ideation or homicidal ideation.  She has never tried medication before for this.  She denies counseling in the past for this.  She states she would like to try counseling as well as medication.  PHQ-9 today with a score of 10.  GAD-7 with a score of 0.  Plan: -Start Prozac 10 mg daily -[facility] information provided -Plan to follow-up in 1 month for evaluation  Hypotension Patient does have slightly decreased blood pressure today.  MAP around 70.  Patient denies any symptoms.  Patient states she normally does have lower blood pressure.  Per chart review, patient did have same blood pressure back in November 2023 when she saw me.  Back in July 2023, patient had blood pressures into the 130s.  Patient is not on any antihypertensive at this time.  She reports good fluid intake.  Plan: -Continue to monitor -Blood pressure log given   Patient discussed with Dr. Princella Pellegrini, DO PGY-1 Internal Medicine Resident  Pager: [phone]

## 2022-08-05 NOTE — Assessment & Plan Note (Signed)
Patient endorses depressed mood for "awhile" now.  She states she has no stressors.  She states she has lost interest in things that she did before such as hiking, going to the movies, going out to dinner with her friends.  She denies any anxiety, anger, or crying spells.  She states she sometimes just wants to lay in bed all day.  She denies any suicidal ideation or homicidal ideation.  She has never tried medication before for this.  She denies counseling in the past for this.  She states she would like to try counseling as well as medication.  PHQ-9 today with a score of 10.  GAD-7 with a score of 0.  Plan: -Start Prozac 10 mg daily -[facility] information provided -Plan to follow-up in 1 month for evaluation

## 2022-08-05 NOTE — Patient Instructions (Addendum)
Cammy, Kamler you for allowing me to take part in your care today.  Here are your instructions.  1. Regarding your diabetes, your A1c was 7.6, please continue with you metformin, and I have increased your Jardiance.   2. For your neuropathy, I have increased your gabapentin to 600 mg nightly.  3. I have also started you on Prozac today, please start taking 10 mg daily. If you want to have counseling, you can call the [facility] at [phone].  4. Follow up in about 1 month and at this time we will do your PAP, collect urine, and we will talk about your mood.  Thank you, Dr. Allena Katz  If you have any other questions please contact the internal medicine clinic at [phone]

## 2022-08-05 NOTE — Telephone Encounter (Signed)
Patient called in to schedule a colonoscopy , she recently had one on December and was told to repeat procedure in 6 months.. did not find any note with this request per Doctor . Can I schedule procedure ?

## 2022-08-05 NOTE — Assessment & Plan Note (Signed)
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  Patient is not scheduled for colonoscopy upon my visit today.  I encourage patient to reach out to her gastroenterologist to get this scheduled which she states she will.  Plan: -Patient to reach out to gastroenterology for repeat colonoscopy as she is due for 1 now

## 2022-08-05 NOTE — Assessment & Plan Note (Signed)
Patient presents for type 2 diabetes follow-up.  Patient states she has made life changes, and is compliant with her medications.  She has no other concerns today.  She states that she is trying to exercise and eat right.  A1c today is 7.6, down from 7.7 back in November 2023.  It did not go up, but did not come down to less than 7.  This is not at goal.  Current medication includes metformin 1000 mg daily and Jardiance 10 mg daily.  Given A1c is not at goal, will increase Jardiance today.  I tried to get patient to give urine today for urine microalbumin/creatinine ratio, but patient was unable to void.  Plan: -Increase Jardiance to 25 mg daily -Continue metformin 1000 mg twice daily -Obtain urine microalbumin/creatinine ratio at next visit -Foot exam updated today

## 2022-08-05 NOTE — Assessment & Plan Note (Signed)
Plan for Pap smear at next visit. 

## 2022-08-05 NOTE — Assessment & Plan Note (Signed)
Patient states that the gabapentin has been helping, but otherwise at night, her burning sensations come back.  She states she would like to potentially try to go up on her gabapentin.  Ultimately strict glucose control would be the answer here.  Foot exam today showing no obvious sensation losses, or wounds.  Plan: -Increase gabapentin to 600 mg nightly -Goal will be for strict glucose control

## 2022-08-05 NOTE — Assessment & Plan Note (Signed)
Patient does have slightly decreased blood pressure today.  MAP around 70.  Patient denies any symptoms.  Patient states she normally does have lower blood pressure.  Per chart review, patient did have same blood pressure back in November 2023 when she saw me.  Back in July 2023, patient had blood pressures into the 130s.  Patient is not on any antihypertensive at this time.  She reports good fluid intake.  Plan: -Continue to monitor -Blood pressure log given

## 2022-08-08 ENCOUNTER — Other Ambulatory Visit: Payer: Self-pay

## 2022-08-08 ENCOUNTER — Encounter: Payer: 59 | Admitting: Student

## 2022-08-08 DIAGNOSIS — K6289 Other specified diseases of anus and rectum: Secondary | ICD-10-CM

## 2022-08-08 NOTE — Telephone Encounter (Signed)
Patient scheduled for PV 6/12 at 8:00. Also confirmed apt at [facility] for date in previous note.  Patient advised understanding. Please f/u if you have any additional information.   Thank you

## 2022-08-08 NOTE — Telephone Encounter (Signed)
Procedure has been scheduled for 09/28/22 at 10:15 am, arrival time 8:45 am. Appt information mailed to patient.

## 2022-08-08 NOTE — Telephone Encounter (Signed)
Per 02/2022 pathology results - flexible sigmoidoscopy at [facility]. Next available appt is 09/28/22 at 10:15 am, pt will need to arrive at [facility] by 8:45 am with a care partner. Patient will need to be scheduled for PV appt.   Lm on vm for patient to return call.

## 2022-08-09 NOTE — Progress Notes (Signed)
Internal Medicine Clinic Attending  Case discussed with Dr. Patel  At the time of the visit.  We reviewed the resident's history and exam and pertinent patient test results.  I agree with the assessment, diagnosis, and plan of care documented in the resident's note.  

## 2022-08-16 ENCOUNTER — Telehealth: Payer: Self-pay | Admitting: *Deleted

## 2022-08-16 NOTE — Telephone Encounter (Signed)
Spoke to patient on the phone regarding this. She states that she has been having this vomiting since last night.  She denies any abdominal pain. She denies any fevers.  She denies any other sick contacts in the house.  She is not sure if this is associated with any foods.  She states that she thought it might be related to Prozac, and I reassure her that it is not.  She states that yesterday she was feeling bad, but did not have any nausea or vomiting, and then overnight she developed this.  She reports that she has not been eating well with her last meal being day before yesterday, but has been staying hydrated with noncaffeinated, unsweetened tea.  I encouraged her to drink water or Gatorade to stay hydrated.  This is likely a viral gastroenteritis which will self resolve.  Symptoms to watch out for include fevers, abdominal pain, increased bowel movements, or bloody emesis provided to patient.  Patient instructed to call back if she notices any of the symptoms.

## 2022-08-17 ENCOUNTER — Encounter: Payer: Self-pay | Admitting: Dietician

## 2022-08-17 ENCOUNTER — Other Ambulatory Visit: Payer: Self-pay

## 2022-08-17 ENCOUNTER — Emergency Department (HOSPITAL_COMMUNITY): Payer: 59

## 2022-08-17 ENCOUNTER — Inpatient Hospital Stay (HOSPITAL_COMMUNITY)
Admission: EM | Admit: 2022-08-17 | Discharge: 2022-08-19 | DRG: 638 | Disposition: A | Discharge status: Home / Self Care | Payer: 59 | Attending: Internal Medicine | Admitting: Internal Medicine

## 2022-08-17 ENCOUNTER — Encounter (HOSPITAL_COMMUNITY): Payer: Self-pay | Admitting: Emergency Medicine

## 2022-08-17 DIAGNOSIS — E1110 Type 2 diabetes mellitus with ketoacidosis without coma: Secondary | ICD-10-CM | POA: Diagnosis not present

## 2022-08-17 DIAGNOSIS — R54 Age-related physical debility: Secondary | ICD-10-CM | POA: Diagnosis present

## 2022-08-17 DIAGNOSIS — F419 Anxiety disorder, unspecified: Secondary | ICD-10-CM | POA: Diagnosis present

## 2022-08-17 DIAGNOSIS — Z800 Family history of malignant neoplasm of digestive organs: Secondary | ICD-10-CM

## 2022-08-17 DIAGNOSIS — E440 Moderate protein-calorie malnutrition: Secondary | ICD-10-CM | POA: Diagnosis present

## 2022-08-17 DIAGNOSIS — Z833 Family history of diabetes mellitus: Secondary | ICD-10-CM

## 2022-08-17 DIAGNOSIS — Z7984 Long term (current) use of oral hypoglycemic drugs: Secondary | ICD-10-CM

## 2022-08-17 DIAGNOSIS — Z823 Family history of stroke: Secondary | ICD-10-CM

## 2022-08-17 DIAGNOSIS — R933 Abnormal findings on diagnostic imaging of other parts of digestive tract: Secondary | ICD-10-CM

## 2022-08-17 DIAGNOSIS — I700 Atherosclerosis of aorta: Secondary | ICD-10-CM | POA: Diagnosis present

## 2022-08-17 DIAGNOSIS — E785 Hyperlipidemia, unspecified: Secondary | ICD-10-CM

## 2022-08-17 DIAGNOSIS — K209 Esophagitis, unspecified without bleeding: Secondary | ICD-10-CM | POA: Diagnosis present

## 2022-08-17 DIAGNOSIS — E860 Dehydration: Secondary | ICD-10-CM | POA: Diagnosis present

## 2022-08-17 DIAGNOSIS — E1140 Type 2 diabetes mellitus with diabetic neuropathy, unspecified: Secondary | ICD-10-CM | POA: Diagnosis present

## 2022-08-17 DIAGNOSIS — F32A Depression, unspecified: Secondary | ICD-10-CM | POA: Diagnosis present

## 2022-08-17 DIAGNOSIS — Z9049 Acquired absence of other specified parts of digestive tract: Secondary | ICD-10-CM

## 2022-08-17 DIAGNOSIS — K642 Third degree hemorrhoids: Secondary | ICD-10-CM | POA: Diagnosis present

## 2022-08-17 DIAGNOSIS — Z87891 Personal history of nicotine dependence: Secondary | ICD-10-CM

## 2022-08-17 DIAGNOSIS — R112 Nausea with vomiting, unspecified: Secondary | ICD-10-CM | POA: Diagnosis present

## 2022-08-17 DIAGNOSIS — E1169 Type 2 diabetes mellitus with other specified complication: Secondary | ICD-10-CM | POA: Diagnosis present

## 2022-08-17 DIAGNOSIS — R739 Hyperglycemia, unspecified: Secondary | ICD-10-CM

## 2022-08-17 DIAGNOSIS — Z79899 Other long term (current) drug therapy: Secondary | ICD-10-CM

## 2022-08-17 DIAGNOSIS — E782 Mixed hyperlipidemia: Secondary | ICD-10-CM | POA: Diagnosis present

## 2022-08-17 DIAGNOSIS — E1142 Type 2 diabetes mellitus with diabetic polyneuropathy: Secondary | ICD-10-CM | POA: Diagnosis present

## 2022-08-17 LAB — CBC WITH DIFFERENTIAL/PLATELET
Abs Immature Granulocytes: 0.03 K/uL (ref 0.00–0.07)
Basophils Absolute: 0 K/uL (ref 0.0–0.1)
Basophils Relative: 0 %
Eosinophils Absolute: 0 K/uL (ref 0.0–0.5)
Eosinophils Relative: 0 %
HCT: 44.8 % (ref 36.0–46.0)
Hemoglobin: 14.9 g/dL (ref 12.0–15.0)
Immature Granulocytes: 0 %
Lymphocytes Relative: 7 %
Lymphs Abs: 0.8 K/uL (ref 0.7–4.0)
MCH: 28.7 pg (ref 26.0–34.0)
MCHC: 33.3 g/dL (ref 30.0–36.0)
MCV: 86.3 fL (ref 80.0–100.0)
Monocytes Absolute: 0.7 K/uL (ref 0.1–1.0)
Monocytes Relative: 6 %
Neutro Abs: 9.5 K/uL — ABNORMAL HIGH (ref 1.7–7.7)
Neutrophils Relative %: 87 %
Platelets: 378 K/uL (ref 150–400)
RBC: 5.19 MIL/uL — ABNORMAL HIGH (ref 3.87–5.11)
RDW: 13.7 % (ref 11.5–15.5)
WBC: 11 K/uL — ABNORMAL HIGH (ref 4.0–10.5)
nRBC: 0 % (ref 0.0–0.2)

## 2022-08-17 LAB — COMPREHENSIVE METABOLIC PANEL
ALT: 23 U/L (ref 0–44)
AST: 26 U/L (ref 15–41)
Albumin: 5.4 g/dL — ABNORMAL HIGH (ref 3.5–5.0)
Alkaline Phosphatase: 55 U/L (ref 38–126)
Anion gap: 22 — ABNORMAL HIGH (ref 5–15)
BUN: 53 mg/dL — ABNORMAL HIGH (ref 8–23)
CO2: 18 mmol/L — ABNORMAL LOW (ref 22–32)
Calcium: 10.2 mg/dL (ref 8.9–10.3)
Chloride: 101 mmol/L (ref 98–111)
Creatinine, Ser: 1.28 mg/dL — ABNORMAL HIGH (ref 0.44–1.00)
GFR, Estimated: 47 mL/min — ABNORMAL LOW (ref >60)
Glucose, Bld: 227 mg/dL — ABNORMAL HIGH (ref 70–99)
Potassium: 4.2 mmol/L (ref 3.5–5.1)
Sodium: 141 mmol/L (ref 135–145)
Total Bilirubin: 1.5 mg/dL — ABNORMAL HIGH (ref 0.3–1.2)
Total Protein: 9.4 g/dL — ABNORMAL HIGH (ref 6.5–8.1)

## 2022-08-17 LAB — URINALYSIS, ROUTINE W REFLEX MICROSCOPIC
Bacteria, UA: NONE SEEN
Bilirubin Urine: NEGATIVE
Glucose, UA: >=500 mg/dL — AB
Hgb urine dipstick: NEGATIVE
Ketones, ur: 80 mg/dL — AB
Leukocytes,Ua: NEGATIVE
Nitrite: NEGATIVE
Protein, ur: 100 mg/dL — AB
Specific Gravity, Urine: 1.039 — ABNORMAL HIGH (ref 1.005–1.030)
pH: 5 (ref 5.0–8.0)

## 2022-08-17 LAB — BLOOD GAS, VENOUS
Acid-base deficit: 8.4 mmol/L — ABNORMAL HIGH (ref 0.0–2.0)
Bicarbonate: 17.9 mmol/L — ABNORMAL LOW (ref 20.0–28.0)
O2 Saturation: 56.9 %
Patient temperature: 37
pCO2, Ven: 39 mmHg — ABNORMAL LOW (ref 44–60)
pH, Ven: 7.27 (ref 7.25–7.43)
pO2, Ven: 32 mmHg (ref 32–45)

## 2022-08-17 LAB — CBG MONITORING, ED: Glucose-Capillary: 207 mg/dL — ABNORMAL HIGH (ref 70–99)

## 2022-08-17 LAB — LIPASE, BLOOD: Lipase: 100 U/L — ABNORMAL HIGH (ref 11–51)

## 2022-08-17 LAB — BETA-HYDROXYBUTYRIC ACID: Beta-Hydroxybutyric Acid: >8 mmol/L — ABNORMAL HIGH (ref 0.05–0.27)

## 2022-08-17 MED ORDER — SODIUM CHLORIDE 0.9 % IV BOLUS
500.0000 mL | Freq: Once | INTRAVENOUS | Status: AC
  Administered 2022-08-17: 500 mL via INTRAVENOUS

## 2022-08-17 MED ORDER — ONDANSETRON HCL 4 MG/2ML IJ SOLN
4.0000 mg | Freq: Once | INTRAMUSCULAR | Status: AC
  Administered 2022-08-17: 4 mg via INTRAVENOUS
  Filled 2022-08-17: qty 2

## 2022-08-17 MED ORDER — IOHEXOL 300 MG/ML  SOLN
80.0000 mL | Freq: Once | INTRAMUSCULAR | Status: AC | PRN
  Administered 2022-08-17: 80 mL via INTRAVENOUS

## 2022-08-17 MED ORDER — FAMOTIDINE IN NACL 20-0.9 MG/50ML-% IV SOLN
20.0000 mg | Freq: Once | INTRAVENOUS | Status: AC
  Administered 2022-08-17: 20 mg via INTRAVENOUS
  Filled 2022-08-17: qty 50

## 2022-08-17 NOTE — ED Provider Notes (Signed)
[place] EMERGENCY DEPARTMENT AT [facility] Provider Note   CSN: [id number] Arrival date & time: 08/17/22  1557     History  Chief Complaint  Patient presents with   Emesis   Diarrhea   HPI Joann Campos is a 67 y.o. female with diabetes and hyperlipidemia presenting for nausea, vomiting and diarrhea.  Started 3 days ago.  Emesis has been persistent and nonbloody and nonbilious.  States diarrhea is only occasional.  Bowel movement 2 days ago.  Associated nausea.  Denies abdominal pain.  Denies fever.  Denies urinary changes.  States she was diagnosed with diabetes in July of last year.  She was given 500 mL of fluid and Zofran and route with EMS.   Emesis Associated symptoms: diarrhea   Diarrhea Associated symptoms: vomiting        Home Medications Prior to Admission medications   Medication Sig Start Date End Date Taking? Authorizing Provider  Cyanocobalamin (VITAMIN B 12 PO) Take 1 tablet by mouth daily. One tablet   Yes [provider]  empagliflozin (JARDIANCE) 25 MG TABS tablet Take 1 tablet (25 mg total) by mouth daily before breakfast. 08/05/22 02/01/23 Yes Patel, Amar, DO  FLUoxetine (PROZAC) 10 MG capsule Take 1 capsule (10 mg total) by mouth daily. 08/05/22 02/01/23 Yes Patel, Azucena Cecil, DO  gabapentin (NEURONTIN) 300 MG capsule Take 2 capsules (600 mg total) by mouth at bedtime. 08/05/22 08/05/23 Yes Modena Slater, DO  metFORMIN (GLUCOPHAGE-XR) 500 MG 24 hr tablet Take 2 tablets (1,000 mg total) by mouth 2 (two) times daily with a meal. 08/05/22 08/05/23 Yes Patel, Azucena Cecil, DO  Multiple Vitamins-Minerals (MULTIVITAMIN ADULTS PO) Take 1 tablet by mouth daily. Women over 50   Yes [provider]  prochlorperazine (COMPAZINE) 10 MG tablet Take 1 tablet (10 mg total) by mouth every 6 (six) hours as needed for nausea or vomiting. 08/19/22  Yes Azucena Fallen, MD  rosuvastatin (CRESTOR) 20 MG tablet Take 1 tablet (20 mg total) by mouth daily.  02/02/22 01/28/23 Yes Modena Slater, DO      Allergies    Patient has no known allergies.    Review of Systems   Review of Systems  Gastrointestinal:  Positive for diarrhea and vomiting.    Physical Exam   Vitals:   08/19/22 0559 08/19/22 1007  BP: 112/83 133/75  Pulse: (!) 121 94  Resp: 16 15  Temp: 98.2 F (36.8 C) 98.7 F (37.1 C)  SpO2: 99% 98%    CONSTITUTIONAL:  well-appearing, NAD NEURO:  Alert and oriented x 3, CN 3-12 grossly intact EYES:  eyes equal and reactive ENT/NECK:  Supple, no stridor, dry mucous membranes CARDIO:  Regular rate and rhythm, appears well-perfused  PULM:  No respiratory distress, CTAB GI/GU:  non-distended, soft, non tender MSK/SPINE:  No gross deformities, no edema, moves all extremities  SKIN:  no rash, atraumatic, sluggish skin turgor  *Additional and/or pertinent findings included in MDM below  ED Results / Procedures / Treatments   Labs (all labs ordered are listed, but only abnormal results are displayed) Labs Reviewed  CBC WITH DIFFERENTIAL/PLATELET - Abnormal; Notable for the following components:      Result Value   WBC 11.0 (*)    RBC 5.19 (*)    Neutro Abs 9.5 (*)    All other components within normal limits  COMPREHENSIVE METABOLIC PANEL - Abnormal; Notable for the following components:   CO2 18 (*)    Glucose, Bld 227 (*)  BUN 53 (*)    Creatinine, Ser 1.28 (*)    Total Protein 9.4 (*)    Albumin 5.4 (*)    Total Bilirubin 1.5 (*)    GFR, Estimated 47 (*)    Anion gap 22 (*)    All other components within normal limits  LIPASE, BLOOD - Abnormal; Notable for the following components:   Lipase 100 (*)    All other components within normal limits  URINALYSIS, ROUTINE W REFLEX MICROSCOPIC - Abnormal; Notable for the following components:   Color, Urine STRAW (*)    Specific Gravity, Urine 1.039 (*)    Glucose, UA >=500 (*)    Ketones, ur 80 (*)    Protein, ur 100 (*)    All other components within normal limits   BETA-HYDROXYBUTYRIC ACID - Abnormal; Notable for the following components:   Beta-Hydroxybutyric Acid >8.00 (*)    All other components within normal limits  BLOOD GAS, VENOUS - Abnormal; Notable for the following components:   pCO2, Ven 39 (*)    Bicarbonate 17.9 (*)    Acid-base deficit 8.4 (*)    All other components within normal limits  BASIC METABOLIC PANEL - Abnormal; Notable for the following components:   Chloride 112 (*)    CO2 15 (*)    Glucose, Bld 161 (*)    BUN 42 (*)    Calcium 8.5 (*)    All other components within normal limits  BASIC METABOLIC PANEL - Abnormal; Notable for the following components:   CO2 14 (*)    Glucose, Bld 153 (*)    BUN 38 (*)    Calcium 8.1 (*)    All other components within normal limits  CBC - Abnormal; Notable for the following components:   Hemoglobin 11.8 (*)    All other components within normal limits  GLUCOSE, CAPILLARY - Abnormal; Notable for the following components:   Glucose-Capillary 133 (*)    All other components within normal limits  CBC - Abnormal; Notable for the following components:   RBC 3.82 (*)    Hemoglobin 10.8 (*)    HCT 33.4 (*)    All other components within normal limits  BASIC METABOLIC PANEL - Abnormal; Notable for the following components:   Potassium 2.9 (*)    CO2 18 (*)    Glucose, Bld 120 (*)    BUN 25 (*)    Calcium 8.4 (*)    All other components within normal limits  GLUCOSE, CAPILLARY - Abnormal; Notable for the following components:   Glucose-Capillary 105 (*)    All other components within normal limits  GLUCOSE, CAPILLARY - Abnormal; Notable for the following components:   Glucose-Capillary 130 (*)    All other components within normal limits  CBG MONITORING, ED - Abnormal; Notable for the following components:   Glucose-Capillary 207 (*)    All other components within normal limits  MAGNESIUM  PHOSPHORUS    EKG EKG Interpretation  Date/Time:  Wednesday Aug 17 2022 18:08:01  EDT Ventricular Rate:  135 PR Interval:  164 QRS Duration: 72 QT Interval:  328 QTC Calculation: 492 R Axis:   89 Text Interpretation: Sinus tachycardia Ventricular premature complex Aberrant complex LAE, consider biatrial enlargement Borderline right axis deviation Low voltage, precordial leads Borderline T abnormalities, inferior leads Borderline prolonged QT interval Confirmed by Coralee Pesa [phone]) on 08/17/2022 6:13:58 PM  Radiology CT ABDOMEN PELVIS W CONTRAST  Result Date: 08/17/2022 CLINICAL DATA:  Abdominal pain, acute, nonlocalized. Nausea, vomiting,  diarrhea EXAM: CT ABDOMEN AND PELVIS WITH CONTRAST TECHNIQUE: Multidetector CT imaging of the abdomen and pelvis was performed using the standard protocol following bolus administration of intravenous contrast. RADIATION DOSE REDUCTION: This exam was performed according to the departmental dose-optimization program which includes automated exposure control, adjustment of the mA and/or kV according to patient size and/or use of iterative reconstruction technique. CONTRAST:  80mL OMNIPAQUE IOHEXOL 300 MG/ML  SOLN COMPARISON:  None Available. FINDINGS: Lower chest: Mild circumferential wall thickening in the distal esophagus suggesting esophagitis. No confluent airspace opacities or effusions. Hepatobiliary: Prior cholecystectomy. 6 mm hypodensity anteriorly in the liver, likely small cyst. No suspicious focal hepatic abnormality. Pancreas: No focal abnormality or ductal dilatation. Spleen: No focal abnormality.  Normal size. Adrenals/Urinary Tract: No suspicious renal or adrenal mass. No hydronephrosis. Punctate 1 mm nonobstructing stone in the upper pole of the right kidney. No ureteral stones. Urinary bladder unremarkable. Stomach/Bowel: Normal appendix. Sigmoid diverticulosis. No active diverticulitis. Stomach and small bowel decompressed, unremarkable. Vascular/Lymphatic: Aortic atherosclerosis. No evidence of aneurysm or adenopathy.  Reproductive: Uterus and adnexa unremarkable.  No mass. Other: No free fluid or free air. Musculoskeletal: No acute bony abnormality. IMPRESSION: Circumferential wall thickening in the distal esophagus suggesting esophagitis. Punctate right upper pole nephrolithiasis.  No hydronephrosis. Sigmoid diverticulosis. Aortic atherosclerosis. No acute findings. Electronically Signed   By: Charlett Nose M.D.   On: 08/17/2022 19:06    Procedures .Critical Care  Performed by: Gareth Eagle, PA-C Authorized by: Gareth Eagle, PA-C   Critical care provider statement:    Critical care time (minutes):  30   Critical care was necessary to treat or prevent imminent or life-threatening deterioration of the following conditions:  Metabolic crisis and dehydration (concern for DKA)   Critical care was time spent personally by me on the following activities:  Development of treatment plan with patient or surrogate, discussions with consultants, evaluation of patient's response to treatment, examination of patient, ordering and review of laboratory studies, ordering and review of radiographic studies, ordering and performing treatments and interventions, pulse oximetry, re-evaluation of patient's condition and review of old charts     Medications Ordered in ED Medications  enoxaparin (LOVENOX) injection 40 mg (40 mg Subcutaneous Given 08/19/22 1011)  acetaminophen (TYLENOL) tablet 650 mg (has no administration in time range)  polyethylene glycol (MIRALAX / GLYCOLAX) packet 17 g (has no administration in time range)  prochlorperazine (COMPAZINE) injection 5 mg (5 mg Intravenous Given 08/18/22 1025)  melatonin tablet 5 mg (has no administration in time range)  lactated ringers infusion (0 mLs Intravenous Stopped 08/19/22 0623)  FLUoxetine (PROZAC) capsule 10 mg (10 mg Oral Given 08/19/22 1010)  multivitamin with minerals tablet 1 tablet (1 tablet Oral Given 08/19/22 0951)  rosuvastatin (CRESTOR) tablet 20 mg (20 mg  Oral Given 08/19/22 0951)  pantoprazole (PROTONIX) injection 40 mg (40 mg Intravenous Given 08/19/22 0951)  gabapentin (NEURONTIN) capsule 300 mg (300 mg Oral Given 08/18/22 2216)  Oral care mouth rinse (has no administration in time range)  protein supplement (ENSURE MAX) liquid (11 oz Oral Given 08/19/22 0951)  insulin aspart (novoLOG) injection 0-9 Units ( Subcutaneous Not Given 08/19/22 0849)  insulin aspart (novoLOG) injection 0-5 Units ( Subcutaneous Not Given 08/18/22 2219)  potassium chloride SA (KLOR-CON M) CR tablet 40 mEq (40 mEq Oral Given 08/19/22 0951)  ondansetron (ZOFRAN) injection 4 mg (4 mg Intravenous Given 08/17/22 1716)  sodium chloride 0.9 % bolus 500 mL (0 mLs Intravenous Stopped 08/17/22 2228)  sodium chloride  0.9 % bolus 500 mL (0 mLs Intravenous Stopped 08/17/22 2229)  iohexol (OMNIPAQUE) 300 MG/ML solution 80 mL (80 mLs Intravenous Contrast Given 08/17/22 1818)  sodium chloride 0.9 % bolus 500 mL (0 mLs Intravenous Stopped 08/17/22 2229)  famotidine (PEPCID) IVPB 20 mg premix (0 mg Intravenous Stopped 08/17/22 2229)  ondansetron (ZOFRAN) injection 4 mg (4 mg Intravenous Given 08/17/22 2233)  sodium chloride 0.9 % bolus 1,000 mL (1,000 mLs Intravenous New Bag/Given 08/18/22 0255)    ED Course/ Medical Decision Making/ A&P                             Medical Decision Making Amount and/or Complexity of Data Reviewed Labs: ordered. Radiology: ordered.  Risk Prescription drug management. Decision regarding hospitalization.   Initial Impression and Ddx 67 year old well-appearing female presenting for nausea vomiting and diarrhea.  Initially tachycardic.  Exam revealed dry mucous membranes sluggish skin turgor.  DDx includes DKA, intra-abdominal infection, sepsis, dehydration Patient PMH that increases complexity of ED encounter:  diabetes  Interpretation of Diagnostics I independent reviewed and interpreted the labs as followed: hyperglycemia, elevated anion gap, elevated  ketones in the blood, elevated BUN/creatinine  - I independently visualized the following imaging with scope of interpretation limited to determining acute life threatening conditions related to emergency care: ct ab/pelvis, which revealed distal wall thickening of the esophagus consistent with esophagitis  -I reviewed and interpreted EKG which revealed sinus tachycardia  Patient Reassessment and Ultimate Disposition/Management Given 3 days of nausea and vomiting, hyperglycemia, elevated ketones, with anion gap and evidence of dehydration, primary concern was possible DKA.  Initially treated with 2 L of fluid.  Heart rate did improve.  Patient stated that she felt mildly improved.  Signed out patient to Fayrene Helper, PA.  Plan is to recheck BMP, if improved could potentially be discharged.  If the same or worse, likely admit for DKA.  Patient management required discussion with the following services or consulting groups:  None  Complexity of Problems Addressed Acute complicated illness or Injury  Additional Data Reviewed and Analyzed Further history obtained from: Further history from spouse/family member, Past medical history and medications listed in the EMR, and Prior ED visit notes  Patient Encounter Risk Assessment Consideration of hospitalization         Final Clinical Impression(s) / ED Diagnoses Final diagnoses:  Nausea vomiting and diarrhea  Hyperglycemia    Rx / DC Orders ED Discharge Orders          Ordered    prochlorperazine (COMPAZINE) 10 MG tablet  Every 6 hours PRN        08/19/22 1120    Amb Referral to Nutrition and Diabetic Education        08/18/22 1347              Gareth Eagle, PA-C 08/19/22 1122    Horton, Clabe Seal, DO 08/24/22 1657

## 2022-08-17 NOTE — ED Notes (Signed)
Pt stated she couldn't void at this time  

## 2022-08-17 NOTE — ED Triage Notes (Signed)
Patient presents from home due to nausea, vomiting and diarrhea for 3 days. Due to a BP of 90/60, EMS administered 500 ml of fluid. They also administered 4 mg of Zofran.     EMS vitals: 116/74 BP 120 BP 97% SPO2 on room air 97 CBG

## 2022-08-17 NOTE — ED Provider Notes (Signed)
Here with n/vd x 3 days.  Hx of DM.  Plan for IV hydration, recheck BMP and PO challenge.  Anticipate d/c home.   Received signout from previous provider, please see his note for complete H&P.  Patient does have significant history of diabetes.  For the past 3 days she has had recurrent nausea vomiting and diarrhea.  Not eating and drinking much.  When she first arrived blood pressure was soft 90/60.  Patient did receive IV fluid as well as antinausea medication by EMS.  Workup today is remarkable for elevated CBG of 227, but patient does have an anion gap of 22 and ketones in her urine concerning for dehydration versus DKA.  Lipase is elevated at 100.  An abdominal pelvis CT scan obtained showing signs of esophagitis.  Patient was giving IV fluid in the ED but maintaining tachycardia despite receiving several boluses of IV fluid.  She is unable to tolerate PO fluid necessitating the needs for admission.   2:58 AM Appreciater consultation from [facility] Dr. Margo Aye who agrees to see and will admit pt for further care.    BP 133/75 (BP Location: Right Arm)   Pulse 94   Temp 98.7 F (37.1 C) (Oral)   Resp 15   Ht 5\' 5"  (1.651 m)   Wt 50.2 kg   SpO2 98%   BMI 18.42 kg/m   Results for orders placed or performed during the hospital encounter of 08/17/22  CBC with Differential  Result Value Ref Range   WBC 11.0 (H) 4.0 - 10.5 K/uL   RBC 5.19 (H) 3.87 - 5.11 MIL/uL   Hemoglobin 14.9 12.0 - 15.0 g/dL   HCT 82.9 56.2 - 13.0 %   MCV 86.3 80.0 - 100.0 fL   MCH 28.7 26.0 - 34.0 pg   MCHC 33.3 30.0 - 36.0 g/dL   RDW 86.5 78.4 - 69.6 %   Platelets 378 150 - 400 K/uL   nRBC 0.0 0.0 - 0.2 %   Neutrophils Relative % 87 %   Neutro Abs 9.5 (H) 1.7 - 7.7 K/uL   Lymphocytes Relative 7 %   Lymphs Abs 0.8 0.7 - 4.0 K/uL   Monocytes Relative 6 %   Monocytes Absolute 0.7 0.1 - 1.0 K/uL   Eosinophils Relative 0 %   Eosinophils Absolute 0.0 0.0 - 0.5 K/uL   Basophils Relative 0 %   Basophils  Absolute 0.0 0.0 - 0.1 K/uL   Immature Granulocytes 0 %   Abs Immature Granulocytes 0.03 0.00 - 0.07 K/uL  Comprehensive metabolic panel  Result Value Ref Range   Sodium 141 135 - 145 mmol/L   Potassium 4.2 3.5 - 5.1 mmol/L   Chloride 101 98 - 111 mmol/L   CO2 18 (L) 22 - 32 mmol/L   Glucose, Bld 227 (H) 70 - 99 mg/dL   BUN 53 (H) 8 - 23 mg/dL   Creatinine, Ser 2.95 (H) 0.44 - 1.00 mg/dL   Calcium 28.4 8.9 - 13.2 mg/dL   Total Protein 9.4 (H) 6.5 - 8.1 g/dL   Albumin 5.4 (H) 3.5 - 5.0 g/dL   AST 26 15 - 41 U/L   ALT 23 0 - 44 U/L   Alkaline Phosphatase 55 38 - 126 U/L   Total Bilirubin 1.5 (H) 0.3 - 1.2 mg/dL   GFR, Estimated 47 (L) >60 mL/min   Anion gap 22 (H) 5 - 15  Lipase, blood  Result Value Ref Range   Lipase 100 (H) 11 - 51  U/L  Urinalysis, Routine w reflex microscopic -Urine, Clean Catch  Result Value Ref Range   Color, Urine STRAW (A) YELLOW   APPearance CLEAR CLEAR   Specific Gravity, Urine 1.039 (H) 1.005 - 1.030   pH 5.0 5.0 - 8.0   Glucose, UA >=500 (A) NEGATIVE mg/dL   Hgb urine dipstick NEGATIVE NEGATIVE   Bilirubin Urine NEGATIVE NEGATIVE   Ketones, ur 80 (A) NEGATIVE mg/dL   Protein, ur 578 (A) NEGATIVE mg/dL   Nitrite NEGATIVE NEGATIVE   Leukocytes,Ua NEGATIVE NEGATIVE   RBC / HPF 0-5 0 - 5 RBC/hpf   WBC, UA 6-10 0 - 5 WBC/hpf   Bacteria, UA NONE SEEN NONE SEEN   Squamous Epithelial / HPF 0-5 0 - 5 /HPF   Mucus PRESENT   Beta-hydroxybutyric acid  Result Value Ref Range   Beta-Hydroxybutyric Acid >8.00 (H) 0.05 - 0.27 mmol/L  Blood gas, venous (at [facility] and [facility])  Result Value Ref Range   pH, Ven 7.27 7.25 - 7.43   pCO2, Ven 39 (L) 44 - 60 mmHg   pO2, Ven 32 32 - 45 mmHg   Bicarbonate 17.9 (L) 20.0 - 28.0 mmol/L   Acid-base deficit 8.4 (H) 0.0 - 2.0 mmol/L   O2 Saturation 56.9 %   Patient temperature 37.0   Basic metabolic panel  Result Value Ref Range   Sodium 142 135 - 145 mmol/L   Potassium 3.5 3.5 - 5.1 mmol/L   Chloride 112 (H) 98 - 111  mmol/L   CO2 15 (L) 22 - 32 mmol/L   Glucose, Bld 161 (H) 70 - 99 mg/dL   BUN 42 (H) 8 - 23 mg/dL   Creatinine, Ser 4.69 0.44 - 1.00 mg/dL   Calcium 8.5 (L) 8.9 - 10.3 mg/dL   GFR, Estimated >62 >95 mL/min   Anion gap 15 5 - 15  Basic metabolic panel  Result Value Ref Range   Sodium 140 135 - 145 mmol/L   Potassium 3.5 3.5 - 5.1 mmol/L   Chloride 111 98 - 111 mmol/L   CO2 14 (L) 22 - 32 mmol/L   Glucose, Bld 153 (H) 70 - 99 mg/dL   BUN 38 (H) 8 - 23 mg/dL   Creatinine, Ser 2.84 0.44 - 1.00 mg/dL   Calcium 8.1 (L) 8.9 - 10.3 mg/dL   GFR, Estimated >13 >24 mL/min   Anion gap 15 5 - 15  CBC  Result Value Ref Range   WBC 10.5 4.0 - 10.5 K/uL   RBC 4.10 3.87 - 5.11 MIL/uL   Hemoglobin 11.8 (L) 12.0 - 15.0 g/dL   HCT 40.1 02.7 - 25.3 %   MCV 89.0 80.0 - 100.0 fL   MCH 28.8 26.0 - 34.0 pg   MCHC 32.3 30.0 - 36.0 g/dL   RDW 66.4 40.3 - 47.4 %   Platelets 282 150 - 400 K/uL   nRBC 0.0 0.0 - 0.2 %  Magnesium  Result Value Ref Range   Magnesium 2.2 1.7 - 2.4 mg/dL  Phosphorus  Result Value Ref Range   Phosphorus 3.1 2.5 - 4.6 mg/dL  Glucose, capillary  Result Value Ref Range   Glucose-Capillary 133 (H) 70 - 99 mg/dL  CBC  Result Value Ref Range   WBC 7.5 4.0 - 10.5 K/uL   RBC 3.82 (L) 3.87 - 5.11 MIL/uL   Hemoglobin 10.8 (L) 12.0 - 15.0 g/dL   HCT 25.9 (L) 56.3 - 87.5 %   MCV 87.4 80.0 - 100.0 fL  MCH 28.3 26.0 - 34.0 pg   MCHC 32.3 30.0 - 36.0 g/dL   RDW 16.1 09.6 - 04.5 %   Platelets 233 150 - 400 K/uL   nRBC 0.0 0.0 - 0.2 %  Basic metabolic panel  Result Value Ref Range   Sodium 137 135 - 145 mmol/L   Potassium 2.9 (L) 3.5 - 5.1 mmol/L   Chloride 109 98 - 111 mmol/L   CO2 18 (L) 22 - 32 mmol/L   Glucose, Bld 120 (H) 70 - 99 mg/dL   BUN 25 (H) 8 - 23 mg/dL   Creatinine, Ser 4.09 0.44 - 1.00 mg/dL   Calcium 8.4 (L) 8.9 - 10.3 mg/dL   GFR, Estimated >81 >19 mL/min   Anion gap 10 5 - 15  Glucose, capillary  Result Value Ref Range   Glucose-Capillary 105 (H) 70  - 99 mg/dL  Glucose, capillary  Result Value Ref Range   Glucose-Capillary 130 (H) 70 - 99 mg/dL  CBG monitoring, ED  Result Value Ref Range   Glucose-Capillary 207 (H) 70 - 99 mg/dL   CT ABDOMEN PELVIS W CONTRAST  Result Date: 08/17/2022 CLINICAL DATA:  Abdominal pain, acute, nonlocalized. Nausea, vomiting, diarrhea EXAM: CT ABDOMEN AND PELVIS WITH CONTRAST TECHNIQUE: Multidetector CT imaging of the abdomen and pelvis was performed using the standard protocol following bolus administration of intravenous contrast. RADIATION DOSE REDUCTION: This exam was performed according to the departmental dose-optimization program which includes automated exposure control, adjustment of the mA and/or kV according to patient size and/or use of iterative reconstruction technique. CONTRAST:  80mL OMNIPAQUE IOHEXOL 300 MG/ML  SOLN COMPARISON:  None Available. FINDINGS: Lower chest: Mild circumferential wall thickening in the distal esophagus suggesting esophagitis. No confluent airspace opacities or effusions. Hepatobiliary: Prior cholecystectomy. 6 mm hypodensity anteriorly in the liver, likely small cyst. No suspicious focal hepatic abnormality. Pancreas: No focal abnormality or ductal dilatation. Spleen: No focal abnormality.  Normal size. Adrenals/Urinary Tract: No suspicious renal or adrenal mass. No hydronephrosis. Punctate 1 mm nonobstructing stone in the upper pole of the right kidney. No ureteral stones. Urinary bladder unremarkable. Stomach/Bowel: Normal appendix. Sigmoid diverticulosis. No active diverticulitis. Stomach and small bowel decompressed, unremarkable. Vascular/Lymphatic: Aortic atherosclerosis. No evidence of aneurysm or adenopathy. Reproductive: Uterus and adnexa unremarkable.  No mass. Other: No free fluid or free air. Musculoskeletal: No acute bony abnormality. IMPRESSION: Circumferential wall thickening in the distal esophagus suggesting esophagitis. Punctate right upper pole nephrolithiasis.   No hydronephrosis. Sigmoid diverticulosis. Aortic atherosclerosis. No acute findings. Electronically Signed   By: Charlett Nose M.D.   On: 08/17/2022 19:06      Fayrene Helper, PA-C 08/21/22 1478    Nira Conn, MD 08/27/22 [phone]

## 2022-08-18 ENCOUNTER — Telehealth: Payer: Self-pay

## 2022-08-18 DIAGNOSIS — Z87891 Personal history of nicotine dependence: Secondary | ICD-10-CM | POA: Diagnosis not present

## 2022-08-18 DIAGNOSIS — Z79899 Other long term (current) drug therapy: Secondary | ICD-10-CM | POA: Diagnosis not present

## 2022-08-18 DIAGNOSIS — E1142 Type 2 diabetes mellitus with diabetic polyneuropathy: Secondary | ICD-10-CM

## 2022-08-18 DIAGNOSIS — Z7984 Long term (current) use of oral hypoglycemic drugs: Secondary | ICD-10-CM | POA: Diagnosis not present

## 2022-08-18 DIAGNOSIS — K642 Third degree hemorrhoids: Secondary | ICD-10-CM

## 2022-08-18 DIAGNOSIS — R112 Nausea with vomiting, unspecified: Secondary | ICD-10-CM | POA: Diagnosis not present

## 2022-08-18 DIAGNOSIS — R197 Diarrhea, unspecified: Secondary | ICD-10-CM | POA: Diagnosis not present

## 2022-08-18 DIAGNOSIS — R54 Age-related physical debility: Secondary | ICD-10-CM | POA: Diagnosis present

## 2022-08-18 DIAGNOSIS — R933 Abnormal findings on diagnostic imaging of other parts of digestive tract: Secondary | ICD-10-CM

## 2022-08-18 DIAGNOSIS — F32A Depression, unspecified: Secondary | ICD-10-CM

## 2022-08-18 DIAGNOSIS — R739 Hyperglycemia, unspecified: Secondary | ICD-10-CM | POA: Diagnosis present

## 2022-08-18 DIAGNOSIS — E1169 Type 2 diabetes mellitus with other specified complication: Secondary | ICD-10-CM | POA: Diagnosis present

## 2022-08-18 DIAGNOSIS — E1110 Type 2 diabetes mellitus with ketoacidosis without coma: Secondary | ICD-10-CM | POA: Diagnosis present

## 2022-08-18 DIAGNOSIS — F419 Anxiety disorder, unspecified: Secondary | ICD-10-CM | POA: Diagnosis present

## 2022-08-18 DIAGNOSIS — Z823 Family history of stroke: Secondary | ICD-10-CM | POA: Diagnosis not present

## 2022-08-18 DIAGNOSIS — Z9049 Acquired absence of other specified parts of digestive tract: Secondary | ICD-10-CM | POA: Diagnosis not present

## 2022-08-18 DIAGNOSIS — Z800 Family history of malignant neoplasm of digestive organs: Secondary | ICD-10-CM | POA: Diagnosis not present

## 2022-08-18 DIAGNOSIS — K209 Esophagitis, unspecified without bleeding: Secondary | ICD-10-CM | POA: Diagnosis present

## 2022-08-18 DIAGNOSIS — E785 Hyperlipidemia, unspecified: Secondary | ICD-10-CM

## 2022-08-18 DIAGNOSIS — Z833 Family history of diabetes mellitus: Secondary | ICD-10-CM | POA: Diagnosis not present

## 2022-08-18 DIAGNOSIS — E860 Dehydration: Secondary | ICD-10-CM | POA: Diagnosis present

## 2022-08-18 DIAGNOSIS — E440 Moderate protein-calorie malnutrition: Secondary | ICD-10-CM | POA: Diagnosis present

## 2022-08-18 DIAGNOSIS — E782 Mixed hyperlipidemia: Secondary | ICD-10-CM

## 2022-08-18 DIAGNOSIS — I700 Atherosclerosis of aorta: Secondary | ICD-10-CM | POA: Diagnosis present

## 2022-08-18 LAB — CBC
HCT: 36.5 % (ref 36.0–46.0)
Hemoglobin: 11.8 g/dL — ABNORMAL LOW (ref 12.0–15.0)
MCH: 28.8 pg (ref 26.0–34.0)
MCHC: 32.3 g/dL (ref 30.0–36.0)
MCV: 89 fL (ref 80.0–100.0)
Platelets: 282 10*3/uL (ref 150–400)
RBC: 4.1 MIL/uL (ref 3.87–5.11)
RDW: 13.9 % (ref 11.5–15.5)
WBC: 10.5 10*3/uL (ref 4.0–10.5)
nRBC: 0 % (ref 0.0–0.2)

## 2022-08-18 LAB — BASIC METABOLIC PANEL
Anion gap: 15 (ref 5–15)
Anion gap: 15 (ref 5–15)
BUN: 38 mg/dL — ABNORMAL HIGH (ref 8–23)
BUN: 42 mg/dL — ABNORMAL HIGH (ref 8–23)
CO2: 14 mmol/L — ABNORMAL LOW (ref 22–32)
CO2: 15 mmol/L — ABNORMAL LOW (ref 22–32)
Calcium: 8.1 mg/dL — ABNORMAL LOW (ref 8.9–10.3)
Calcium: 8.5 mg/dL — ABNORMAL LOW (ref 8.9–10.3)
Chloride: 111 mmol/L (ref 98–111)
Chloride: 112 mmol/L — ABNORMAL HIGH (ref 98–111)
Creatinine, Ser: 0.9 mg/dL (ref 0.44–1.00)
Creatinine, Ser: 0.94 mg/dL (ref 0.44–1.00)
GFR, Estimated: >60 mL/min (ref >60)
GFR, Estimated: >60 mL/min (ref >60)
Glucose, Bld: 153 mg/dL — ABNORMAL HIGH (ref 70–99)
Glucose, Bld: 161 mg/dL — ABNORMAL HIGH (ref 70–99)
Potassium: 3.5 mmol/L (ref 3.5–5.1)
Potassium: 3.5 mmol/L (ref 3.5–5.1)
Sodium: 140 mmol/L (ref 135–145)
Sodium: 142 mmol/L (ref 135–145)

## 2022-08-18 LAB — MAGNESIUM: Magnesium: 2.2 mg/dL (ref 1.7–2.4)

## 2022-08-18 LAB — PHOSPHORUS: Phosphorus: 3.1 mg/dL (ref 2.5–4.6)

## 2022-08-18 LAB — GLUCOSE, CAPILLARY: Glucose-Capillary: 133 mg/dL — ABNORMAL HIGH (ref 70–99)

## 2022-08-18 MED ORDER — ENOXAPARIN SODIUM 40 MG/0.4ML IJ SOSY
40.0000 mg | PREFILLED_SYRINGE | INTRAMUSCULAR | Status: DC
  Administered 2022-08-18 – 2022-08-19 (×2): 40 mg via SUBCUTANEOUS
  Filled 2022-08-18 (×2): qty 0.4

## 2022-08-18 MED ORDER — INSULIN ASPART 100 UNIT/ML IJ SOLN: 0.0000 [IU] | Freq: Every day | INTRAMUSCULAR | Status: DC

## 2022-08-18 MED ORDER — GABAPENTIN 300 MG PO CAPS
300.0000 mg | ORAL_CAPSULE | Freq: Every day | ORAL | Status: DC
  Administered 2022-08-18: 300 mg via ORAL
  Filled 2022-08-18: qty 1

## 2022-08-18 MED ORDER — SODIUM CHLORIDE 0.9 % IV BOLUS
1000.0000 mL | Freq: Once | INTRAVENOUS | Status: AC
  Administered 2022-08-18: 1000 mL via INTRAVENOUS

## 2022-08-18 MED ORDER — INSULIN ASPART 100 UNIT/ML IJ SOLN: 0.0000 [IU] | Freq: Three times a day (TID) | INTRAMUSCULAR | Status: DC

## 2022-08-18 MED ORDER — POLYETHYLENE GLYCOL 3350 17 G PO PACK: 17.0000 g | PACK | Freq: Every day | ORAL | Status: DC | PRN

## 2022-08-18 MED ORDER — ACETAMINOPHEN 325 MG PO TABS: 650.0000 mg | ORAL_TABLET | Freq: Four times a day (QID) | ORAL | Status: DC | PRN

## 2022-08-18 MED ORDER — PANTOPRAZOLE SODIUM 40 MG IV SOLR
40.0000 mg | Freq: Two times a day (BID) | INTRAVENOUS | Status: DC
  Administered 2022-08-18 – 2022-08-19 (×4): 40 mg via INTRAVENOUS
  Filled 2022-08-18 (×4): qty 10

## 2022-08-18 MED ORDER — ROSUVASTATIN CALCIUM 20 MG PO TABS
20.0000 mg | ORAL_TABLET | Freq: Every day | ORAL | Status: DC
  Administered 2022-08-18 – 2022-08-19 (×2): 20 mg via ORAL
  Filled 2022-08-18 (×2): qty 1

## 2022-08-18 MED ORDER — LACTATED RINGERS IV SOLN: INTRAVENOUS | Status: AC

## 2022-08-18 MED ORDER — MELATONIN 5 MG PO TABS: 5.0000 mg | ORAL_TABLET | Freq: Every evening | ORAL | Status: DC | PRN

## 2022-08-18 MED ORDER — FLUOXETINE HCL 10 MG PO CAPS
10.0000 mg | ORAL_CAPSULE | Freq: Every day | ORAL | Status: DC
  Administered 2022-08-19: 10 mg via ORAL
  Filled 2022-08-18 (×2): qty 1

## 2022-08-18 MED ORDER — PROCHLORPERAZINE EDISYLATE 10 MG/2ML IJ SOLN
5.0000 mg | Freq: Four times a day (QID) | INTRAMUSCULAR | Status: DC | PRN
  Administered 2022-08-18: 5 mg via INTRAVENOUS
  Filled 2022-08-18: qty 2

## 2022-08-18 MED ORDER — ADULT MULTIVITAMIN W/MINERALS CH
1.0000 | ORAL_TABLET | Freq: Every day | ORAL | Status: DC
  Administered 2022-08-18 – 2022-08-19 (×2): 1 via ORAL
  Filled 2022-08-18 (×2): qty 1

## 2022-08-18 MED ORDER — ORAL CARE MOUTH RINSE: 15.0000 mL | OROMUCOSAL | Status: DC | PRN

## 2022-08-18 MED ORDER — ENSURE MAX PROTEIN PO LIQD
11.0000 [oz_av] | Freq: Two times a day (BID) | ORAL | Status: DC
  Administered 2022-08-18 – 2022-08-19 (×3): 11 [oz_av] via ORAL
  Filled 2022-08-18 (×3): qty 330

## 2022-08-18 NOTE — Progress Notes (Signed)
   08/18/22 0858  PT Visit Information  Last PT Received On 08/18/22  Reason Eval/Treat Not Completed  (Pt initially agreeable to mobility, but when cued for OOB pt refused stating she "does not feel like it". Pt continued to refuse depsite education on importance of OOB moblity and PT role/POC in d/c planning. PT will follow up as schedule allows.)

## 2022-08-18 NOTE — TOC Initial Note (Signed)
Transition of Care [facility]) - Initial/Assessment Note    Patient Details  Name: Joann Campos MRN: [id number] Date of Birth: 1956-01-26  Transition of Care [facility]) CM/SW Contact:    Lanier Clam, RN Phone Number: 08/18/2022, 1:36 PM  Clinical Narrative: From home. D/c plan return home.Has transport.                  Expected Discharge Plan: Home/Self Care Barriers to Discharge: Continued Medical Work up   Patient Goals and CMS Choice Patient states their goals for this hospitalization and ongoing recovery are:: Home CMS Medicare.gov Compare Post Acute Care list provided to:: Patient Choice offered to / list presented to : Patient [place] ownership interest in [facility].provided to:: Patient    Expected Discharge Plan and Services   Discharge Planning Services: CM Consult   Living arrangements for the past 2 months: Single Family Home                                      Prior Living Arrangements/Services Living arrangements for the past 2 months: Single Family Home Lives with:: Spouse Patient language and need for interpreter reviewed:: Yes Do you feel safe going back to the place where you live?: Yes      Need for Family Participation in Patient Care: Yes (Comment) Care giver support system in place?: Yes (comment)   Criminal Activity/Legal Involvement Pertinent to Current Situation/Hospitalization: No - Comment as needed  Activities of Daily Living Home Assistive Devices/Equipment: Eyeglasses ADL Screening (condition at time of admission) Patient's cognitive ability adequate to safely complete daily activities?: Yes Is the patient deaf or have difficulty hearing?: No Does the patient have difficulty seeing, even when wearing glasses/contacts?: No Does the patient have difficulty concentrating, remembering, or making decisions?: No Patient able to express need for assistance with ADLs?: Yes Does the patient have difficulty dressing or  bathing?: No Independently performs ADLs?: Yes (appropriate for developmental age) Does the patient have difficulty walking or climbing stairs?: Yes Weakness of Legs: None Weakness of Arms/Hands: None  Permission Sought/Granted Permission sought to share information with : Case Manager Permission granted to share information with : Yes, Verbal Permission Granted  Share Information with NAME: Case manager           Emotional Assessment Appearance:: Appears stated age Attitude/Demeanor/Rapport: Gracious Affect (typically observed): Accepting Orientation: : Oriented to Self, Oriented to Place, Oriented to  Time, Oriented to Situation Alcohol / Substance Use: Not Applicable Psych Involvement: No (comment)  Admission diagnosis:  Hyperglycemia [R73.9] Nausea vomiting and diarrhea [R11.2, R19.7] Intractable nausea and vomiting [R11.2] Patient Active Problem List   Diagnosis Date Noted   Intractable nausea and vomiting 08/18/2022   DKA (diabetic ketoacidosis) (HCC) 08/18/2022   Screening for cervical cancer 08/05/2022   Depression 08/05/2022   Hypotension 08/05/2022   Rectal adenoma 03/01/2022   Grade III internal hemorrhoids 03/01/2022   Elevated hemoglobin (HCC) 01/31/2022   Mixed hyperlipidemia 09/30/2021   Type 2 diabetes mellitus with hyperlipidemia (HCC) 09/30/2021   Diabetic neuropathy (HCC) 09/29/2021   Healthcare maintenance 09/29/2021   History of iritis 09/29/2021   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   PCP:  Modena Slater, DO Pharmacy:   [facility] [phone] - [place], [place] - [address] [place] [address] Phone: [phone] Fax: [phone]  [facility] - [place], [place] - [address] [phone] [address]  [address] [place] [address] Phone: [phone] Fax: [phone]     Social Determinants of Health (SDOH) Social History: SDOH Screenings   Food Insecurity: No Food Insecurity (08/18/2022)  Housing:  Low Risk  (08/18/2022)  Transportation Needs: No Transportation Needs (08/18/2022)  Utilities: Not At Risk (08/18/2022)  Depression (PHQ2-9): Medium Risk (08/05/2022)  Tobacco Use: Medium Risk (08/17/2022)   SDOH Interventions:     Readmission Risk Interventions     No data to display

## 2022-08-18 NOTE — ED Notes (Signed)
Notified by pt's husband that pt needed to use the restroom. Upon entering room, pt is standing at foot of bed, all monitoring devices removed and has no pants or underwear on. Pt says she feels okay standing but then tells this writer she doesn't feel well when I suggested ambulating to restroom. Bedside commode placed in room and pt was able to get to and from without assistance. Pt reconnected for pulse ox and BP readings and is resting comfortably in bed. Call light within reach.

## 2022-08-18 NOTE — ED Notes (Signed)
Provided pt with ice water for PO challenge 

## 2022-08-18 NOTE — Consult Note (Addendum)
Consultation  Referring Provider:  [facility]  Primary Care Physician:  Modena Slater, DO Primary Gastroenterologist:  Dr. Barron Alvine       Reason for Consultation:     intractable nausea and vomiting  LOS: 0 days          HPI:   Joann Campos is a 67 y.o. female with past medical history significant for type 2 diabetes, hyperlipidemia, nonbleeding internal hemorrhoids, presents for evaluation for intractable nausea and vomiting.  Reports to ED after progressive worsening of nausea and vomiting over the last 3 days.  Emesis is nonbloody and nonbilious. She states it came on suddenly. Reports 10 episodes of vomiting per day. No nausea and vomiting today. Denies previous episodes of this. Reports for the last 3 days she hasn't been able to eat due to nausea/vomiting. Symptoms are not associated with eating. Denies heartburn. Denies abdominal pain. Denies weight loss. Denies melena/hematochezia. Denies NSAIDs, alcohol, or tobacco.   Had 1 episode of loose stools 2 to 3 days ago and has not had a bowel movement since.  In the ED she failed her p.o. challenge.  Received 2L IV fluid boluses, 2 rounds IV Zofran, IV Pepcid without significant improvement.  CT abdomen pelvis with contrast showed circumferential wall thickening in the distal esophagus suggestive of esophagitis.  Nephrolithiasis.  Sigmoid diverticulosis.  Hgb 11.8.  BUN 38, creatinine 0.94  Patient declines EGD. Sedation isn't a bother for her, but does not want a scope going down her throat.  PREVIOUS GI WORKUP Colonoscopy 01/04/2022 for screening with Dr. Barron Alvine: Hemorrhoids, 2 tubular adenomas in sigmoid colon (3 to 5 mm).  40 mm polyp (tubulovillous adenoma) in the rectum.  Polyp was soft and villous, initial appearance more like a mass but able to manipulate polyp.  Biopsied with plans for resection at the hospital.   Colonoscopy 03/01/2022 due to large polyp found on previous colonoscopy: 50 mm polyp in the rectum,  pedunculated with a thick stalk.  Removed via piecemeal using a combination of epinephrine, Endoloop, hot snare, and forceps.  Resected and retrieved.  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  Negative for invasive carcinoma.  Scheduled for repeat flex sig September 28, 2022  Past Medical History:  Diagnosis Date   Diabetes (HCC) 09/2021   Hip pain 2021   Hyperlipidemia     Surgical History:  She  has a past surgical history that includes Cholecystectomy; Breast reduction surgery (Bilateral); Colonoscopy with propofol (N/A, 03/01/2022); Endoscopic mucosal resection (N/A, 03/01/2022); and Sclerotherapy (03/01/2022). Family History:  Her family history includes CVA in her brother and sibling; Colon cancer in her mother; Diabetes in her brother; Hypercholesterolemia in her father. Social History:   reports that she has quit smoking. She has never been exposed to tobacco smoke. She has never used smokeless tobacco. She reports that she does not drink alcohol and does not use drugs.  Prior to Admission medications   Medication Sig Start Date End Date Taking? Authorizing Provider  Cyanocobalamin (VITAMIN B 12 PO) Take 1 tablet by mouth daily. One tablet   Yes [provider]  empagliflozin (JARDIANCE) 25 MG TABS tablet Take 1 tablet (25 mg total) by mouth daily before breakfast. 08/05/22 02/01/23 Yes Patel, Amar, DO  FLUoxetine (PROZAC) 10 MG capsule Take 1 capsule (10 mg total) by mouth daily. 08/05/22 02/01/23 Yes Patel, Azucena Cecil, DO  gabapentin (NEURONTIN) 300 MG capsule Take 2 capsules (600 mg total) by mouth at bedtime.  08/05/22 08/05/23 Yes Modena Slater, DO  metFORMIN (GLUCOPHAGE-XR) 500 MG 24 hr tablet Take 2 tablets (1,000 mg total) by mouth 2 (two) times daily with a meal. 08/05/22 08/05/23 Yes Patel, Azucena Cecil, DO  Multiple Vitamins-Minerals (MULTIVITAMIN ADULTS PO) Take 1 tablet by mouth daily. Women over 50   Yes [provider]   rosuvastatin (CRESTOR) 20 MG tablet Take 1 tablet (20 mg total) by mouth daily. 02/02/22 01/28/23 Yes Modena Slater, DO    Current Facility-Administered Medications  Medication Dose Route Frequency Provider Last Rate Last Admin   acetaminophen (TYLENOL) tablet 650 mg  650 mg Oral Q6H PRN Dow Adolph N, DO       enoxaparin (LOVENOX) injection 40 mg  40 mg Subcutaneous Q24H Hall, Carole N, DO       FLUoxetine (PROZAC) capsule 10 mg  10 mg Oral Daily Hall, Carole N, DO       gabapentin (NEURONTIN) capsule 300 mg  300 mg Oral QHS Hall, Carole N, DO       lactated ringers infusion   Intravenous Continuous Azucena Fallen, MD 125 mL/hr at 08/18/22 0745 Rate Change at 08/18/22 0745   melatonin tablet 5 mg  5 mg Oral QHS PRN Darlin Drop, DO       multivitamin with minerals tablet 1 tablet  1 tablet Oral Daily Darlin Drop, DO       Oral care mouth rinse  15 mL Mouth Rinse PRN Azucena Fallen, MD       pantoprazole (PROTONIX) injection 40 mg  40 mg Intravenous BID Dow Adolph N, DO   40 mg at 08/18/22 0516   polyethylene glycol (MIRALAX / GLYCOLAX) packet 17 g  17 g Oral Daily PRN Dow Adolph N, DO       prochlorperazine (COMPAZINE) injection 5 mg  5 mg Intravenous Q6H PRN Hall, Carole N, DO       rosuvastatin (CRESTOR) tablet 20 mg  20 mg Oral Daily Dow Adolph N, DO        Allergies as of 08/17/2022   (No Known Allergies)    Review of Systems  Constitutional:  Negative for chills, fever, malaise/fatigue and weight loss.  HENT:  Negative for hearing loss and tinnitus.   Eyes:  Negative for blurred vision and double vision.  Respiratory:  Negative for cough and hemoptysis.   Cardiovascular:  Negative for chest pain and palpitations.  Gastrointestinal:  Positive for nausea and vomiting. Negative for abdominal pain, blood in stool, constipation, diarrhea, heartburn and melena.  Genitourinary:  Negative for dysuria and urgency.  Musculoskeletal:  Negative for myalgias and neck  pain.  Skin:  Negative for itching and rash.  Neurological:  Negative for seizures and loss of consciousness.  Psychiatric/Behavioral:  Negative for depression and suicidal ideas.        Physical Exam:  Vital signs in last 24 hours: Temp:  [98 F (36.7 C)-98.1 F (36.7 C)] 98.1 F (36.7 C) (05/30 0450) Pulse Rate:  [112-130] 112 (05/30 0450) Resp:  [15-33] 16 (05/30 0450) BP: (105-153)/(64-99) 138/85 (05/30 0450) SpO2:  [94 %-100 %] 98 % (05/30 0450) Weight:  [50.2 kg] 50.2 kg (05/30 0444) Last BM Date : 08/16/22 Last BM recorded by nurses in past 5 days No data recorded  Physical Exam Constitutional:      Comments: Frail, thin appearing, malnourished  HENT:     Head: Normocephalic and atraumatic.     Mouth/Throat:     Mouth: Mucous membranes are moist.  Pharynx: Oropharynx is clear.  Eyes:     General: No scleral icterus.    Extraocular Movements: Extraocular movements intact.  Cardiovascular:     Rate and Rhythm: Normal rate and regular rhythm.  Pulmonary:     Effort: Pulmonary effort is normal. No respiratory distress.  Abdominal:     General: Abdomen is flat. Bowel sounds are normal. There is no distension.     Palpations: Abdomen is soft. There is no mass.     Tenderness: There is no abdominal tenderness. There is no guarding or rebound.     Hernia: No hernia is present.  Musculoskeletal:        General: No swelling. Normal range of motion.     Cervical back: Normal range of motion and neck supple.  Skin:    General: Skin is warm and dry.  Neurological:     General: No focal deficit present.     Mental Status: She is oriented to person, place, and time.  Psychiatric:        Mood and Affect: Mood normal.        Behavior: Behavior normal.        Thought Content: Thought content normal.        Judgment: Judgment normal.      LAB RESULTS: Recent Labs    08/17/22 1719 08/18/22 0517  WBC 11.0* 10.5  HGB 14.9 11.8*  HCT 44.8 36.5  PLT 378 282    BMET Recent Labs    08/17/22 1719 08/18/22 0020 08/18/22 0517  NA 141 142 140  K 4.2 3.5 3.5  CL 101 112* 111  CO2 18* 15* 14*  GLUCOSE 227* 161* 153*  BUN 53* 42* 38*  CREATININE 1.28* 0.90 0.94  CALCIUM 10.2 8.5* 8.1*   LFT Recent Labs    08/17/22 1719  PROT 9.4*  ALBUMIN 5.4*  AST 26  ALT 23  ALKPHOS 55  BILITOT 1.5*   PT/INR No results for input(s): "LABPROT", "INR" in the last 72 hours.  STUDIES: CT ABDOMEN PELVIS W CONTRAST  Result Date: 08/17/2022 CLINICAL DATA:  Abdominal pain, acute, nonlocalized. Nausea, vomiting, diarrhea EXAM: CT ABDOMEN AND PELVIS WITH CONTRAST TECHNIQUE: Multidetector CT imaging of the abdomen and pelvis was performed using the standard protocol following bolus administration of intravenous contrast. RADIATION DOSE REDUCTION: This exam was performed according to the departmental dose-optimization program which includes automated exposure control, adjustment of the mA and/or kV according to patient size and/or use of iterative reconstruction technique. CONTRAST:  80mL OMNIPAQUE IOHEXOL 300 MG/ML  SOLN COMPARISON:  None Available. FINDINGS: Lower chest: Mild circumferential wall thickening in the distal esophagus suggesting esophagitis. No confluent airspace opacities or effusions. Hepatobiliary: Prior cholecystectomy. 6 mm hypodensity anteriorly in the liver, likely small cyst. No suspicious focal hepatic abnormality. Pancreas: No focal abnormality or ductal dilatation. Spleen: No focal abnormality.  Normal size. Adrenals/Urinary Tract: No suspicious renal or adrenal mass. No hydronephrosis. Punctate 1 mm nonobstructing stone in the upper pole of the right kidney. No ureteral stones. Urinary bladder unremarkable. Stomach/Bowel: Normal appendix. Sigmoid diverticulosis. No active diverticulitis. Stomach and small bowel decompressed, unremarkable. Vascular/Lymphatic: Aortic atherosclerosis. No evidence of aneurysm or adenopathy. Reproductive: Uterus and  adnexa unremarkable.  No mass. Other: No free fluid or free air. Musculoskeletal: No acute bony abnormality. IMPRESSION: Circumferential wall thickening in the distal esophagus suggesting esophagitis. Punctate right upper pole nephrolithiasis.  No hydronephrosis. Sigmoid diverticulosis. Aortic atherosclerosis. No acute findings. Electronically Signed   By: Charlett Nose M.D.  On: 08/17/2022 19:06      Impression    Intractable nausea and vomiting -Hgb 11.8, likely hemodilutional - WBC 10.5 (11.0) -BUN 38, creatinine 0.94 -Potassium 3.5 - AST 26/ALT 23/Alk phos 55 - T bili 1.5 - Lipase 100 - CT ab/pelvis w contrast shows thickening of esophagus concerning for esophagitis. S/p cholecystectomy. Normal pancreas Nausea and vomiting likely secondary to DKA in the setting of uncontrolled diabetes with elevated glucose, elevated A1C, Beta-hydroxybutyric acid >8.00. Esophagitis seen on CT could be sequale of persistent episodes of nausea and vomiting versus primary source of symptoms. Less likely infectious source with only one episode of diarrhea a few days ago. Patient declines EGD. Would prefer conservative treatment. Symptoms improved today.  DMII - glucose 161 - A1C 7.6 - Beta-hydroxybutyric acid >8.00   Plan   - conservative management with protonix 40mg  IV BID. Would prefer to avoid carafate due to history of uncontrolled diabetes. - can do trial of clear liquids as tolerated from GI standpoint, defer diet to primary team. - continue anti-emetics as needed - consider outpatient gastric emptying scan to rule out diabetic gastroparesis  ADDENDUM 9:32AM - GI will likely sign off and follow up outpatient since patient declines EGD at this time - outpatient follow up scheduled (see discharge).  Thank you for your kind consultation   Legrand Como  08/18/2022, 8:17 AM

## 2022-08-18 NOTE — Discharge Instructions (Signed)

## 2022-08-18 NOTE — ED Notes (Signed)
Recollected and sent BMP.

## 2022-08-18 NOTE — Telephone Encounter (Signed)
PV appt cancelled, pt scheduled for a hospital follow up with Dr. Barron Alvine on 09/14/22 at 10 am. Abbott Pao, PA-C is aware and will add appt information to patient's hospital discharge.  I left a message for patient to give me a call back to discuss appt changes.

## 2022-08-18 NOTE — H&P (Addendum)
History and Physical  Joann Campos ZOX:[id number] DOB: September 22, 1955 DOA: 08/17/2022  Referring physician: Fayrene Helper, PA-EDP  PCP: Modena Slater, DO  Outpatient Specialists: GI, orthopedic surgery Patient coming from: Home.  Chief Complaint: Nausea vomiting x 3 days.  HPI: Joann Campos is a 67 y.o. female with medical history significant for type 2 diabetes, diabetic polyneuropathy, hyperlipidemia, nonbleeding internal hemorrhoids, rectal polyp, who presented to [facility] ED from home due to persistent nausea and vomiting, progressively worsening over the last 3 days.  Emesis has been persistent with non bloody and non bilious appearance.  Associated with 1 episode of loose stools 2- 3 days ago, no bowel movement since.  The patient presented to the ED for further evaluation.  In the ED, the patient received 2 L IV fluid boluses, 2 rounds of IV Zofran x 2, IV Pepcid without significant improvement.  She failed her p.o. challenge in the ED.  Unable to keep any food down.  Contrast CT abdomen and pelvis revealed the following findings: Circumferential wall thickening in the distal esophagus suggesting esophagitis.  Punctate right upper pole nephrolithiasis.  No hydronephrosis.  Sigmoid diverticulosis.  Aortic atherosclerosis.   EDP requesting admission for further management of intractable nausea and vomiting.  Admitted by [facility], hospitalist service, to telemetry unit as inpatient status.  ED Course: Temperature 98.  BP 109/70, pulse 125, respiratory 16, O2 saturation of 99% on room air.  Lab studies remarkable for WBC 11.0.  Hemoglobin 14.9, platelet count 378.  Neutrophil count 9.5.  Potassium 3.5, serum bicarb 15, serum glucose 161, BUN 42, creatinine 0.90, GFR greater than 60.  Review of Systems: Review of systems as noted in the HPI. All other systems reviewed and are negative.   Past Medical History:  Diagnosis Date   Diabetes (HCC) 09/2021   Hip pain 2021   Hyperlipidemia    Past  Surgical History:  Procedure Laterality Date   BREAST REDUCTION SURGERY Bilateral    1990s   CHOLECYSTECTOMY     COLONOSCOPY WITH PROPOFOL N/A 03/01/2022   Procedure: COLONOSCOPY WITH PROPOFOL;  Surgeon: Shellia Cleverly, DO;  Location: [facility] ENDOSCOPY;  Service: Gastroenterology;  Laterality: N/A;   ENDOSCOPIC MUCOSAL RESECTION N/A 03/01/2022   Procedure: ENDOSCOPIC MUCOSAL RESECTION;  Surgeon: Shellia Cleverly, DO;  Location: [facility] ENDOSCOPY;  Service: Gastroenterology;  Laterality: N/A;   SCLEROTHERAPY  03/01/2022   Procedure: SCLEROTHERAPY;  Surgeon: Shellia Cleverly, DO;  Location: [facility] ENDOSCOPY;  Service: Gastroenterology;;    Social History:  reports that she has quit smoking. She has never been exposed to tobacco smoke. She has never used smokeless tobacco. She reports that she does not drink alcohol and does not use drugs.   No Known Allergies  Family History  Problem Relation Age of Onset   Colon cancer Mother    Hypercholesterolemia Father    Diabetes Brother    CVA Brother    CVA Sibling    Crohn's disease Neg Hx    Esophageal cancer Neg Hx    Rectal cancer Neg Hx    Stomach cancer Neg Hx    Ulcerative colitis Neg Hx       Prior to Admission medications   Medication Sig Start Date End Date Taking? Authorizing Provider  Cyanocobalamin (VITAMIN B 12 PO) Take 1 tablet by mouth daily. One tablet   Yes [provider]  empagliflozin (JARDIANCE) 25 MG TABS tablet Take 1 tablet (25 mg total) by mouth daily before breakfast. 08/05/22 02/01/23 Yes Modena Slater, DO  FLUoxetine (PROZAC) 10 MG capsule Take 1 capsule (10 mg total) by mouth daily. 08/05/22 02/01/23 Yes Patel, Azucena Cecil, DO  gabapentin (NEURONTIN) 300 MG capsule Take 2 capsules (600 mg total) by mouth at bedtime. 08/05/22 08/05/23 Yes Modena Slater, DO  metFORMIN (GLUCOPHAGE-XR) 500 MG 24 hr tablet Take 2 tablets (1,000 mg total) by mouth 2 (two) times daily with a meal. 08/05/22 08/05/23 Yes Patel, Azucena Cecil, DO  Multiple  Vitamins-Minerals (MULTIVITAMIN ADULTS PO) Take 1 tablet by mouth daily. Women over 50   Yes [provider]  rosuvastatin (CRESTOR) 20 MG tablet Take 1 tablet (20 mg total) by mouth daily. 02/02/22 01/28/23 Yes Modena Slater, DO    Physical Exam: BP (!) 153/80 (BP Location: Left Arm)   Pulse (!) 120   Temp 98 F (36.7 C) (Oral)   Resp 18   SpO2 99%   General: 67 y.o. year-old female well developed well nourished in no acute distress.  Alert and oriented x3. Cardiovascular: Tachycardic with no rubs or gallops.  No thyromegaly or JVD noted.  No lower extremity edema. 2/4 pulses in all 4 extremities. Respiratory: Clear to auscultation with no wheezes or rales. Good inspiratory effort. Abdomen: Soft nontender nondistended with normal bowel sounds x4 quadrants. Muskuloskeletal: No cyanosis, clubbing or edema noted bilaterally Neuro: CN II-XII intact, strength, sensation, reflexes Skin: No ulcerative lesions noted or rashes Psychiatry: Judgement and insight appear normal. Mood is appropriate for condition and setting          Labs on Admission:  Basic Metabolic Panel: Recent Labs  Lab 08/17/22 1719 08/18/22 0020  NA 141 142  K 4.2 3.5  CL 101 112*  CO2 18* 15*  GLUCOSE 227* 161*  BUN 53* 42*  CREATININE 1.28* 0.90  CALCIUM 10.2 8.5*   Liver Function Tests: Recent Labs  Lab 08/17/22 1719  AST 26  ALT 23  ALKPHOS 55  BILITOT 1.5*  PROT 9.4*  ALBUMIN 5.4*   Recent Labs  Lab 08/17/22 1719  LIPASE 100*   No results for input(s): "AMMONIA" in the last 168 hours. CBC: Recent Labs  Lab 08/17/22 1719  WBC 11.0*  NEUTROABS 9.5*  HGB 14.9  HCT 44.8  MCV 86.3  PLT 378   Cardiac Enzymes: No results for input(s): "CKTOTAL", "CKMB", "CKMBINDEX", "TROPONINI" in the last 168 hours.  BNP (last 3 results) No results for input(s): "BNP" in the last 8760 hours.  ProBNP (last 3 results) No results for input(s): "PROBNP" in the last 8760 hours.  CBG: Recent Labs   Lab 08/17/22 1657  GLUCAP 207*    Radiological Exams on Admission: CT ABDOMEN PELVIS W CONTRAST  Result Date: 08/17/2022 CLINICAL DATA:  Abdominal pain, acute, nonlocalized. Nausea, vomiting, diarrhea EXAM: CT ABDOMEN AND PELVIS WITH CONTRAST TECHNIQUE: Multidetector CT imaging of the abdomen and pelvis was performed using the standard protocol following bolus administration of intravenous contrast. RADIATION DOSE REDUCTION: This exam was performed according to the departmental dose-optimization program which includes automated exposure control, adjustment of the mA and/or kV according to patient size and/or use of iterative reconstruction technique. CONTRAST:  80mL OMNIPAQUE IOHEXOL 300 MG/ML  SOLN COMPARISON:  None Available. FINDINGS: Lower chest: Mild circumferential wall thickening in the distal esophagus suggesting esophagitis. No confluent airspace opacities or effusions. Hepatobiliary: Prior cholecystectomy. 6 mm hypodensity anteriorly in the liver, likely small cyst. No suspicious focal hepatic abnormality. Pancreas: No focal abnormality or ductal dilatation. Spleen: No focal abnormality.  Normal size. Adrenals/Urinary Tract: No suspicious renal or adrenal mass.  No hydronephrosis. Punctate 1 mm nonobstructing stone in the upper pole of the right kidney. No ureteral stones. Urinary bladder unremarkable. Stomach/Bowel: Normal appendix. Sigmoid diverticulosis. No active diverticulitis. Stomach and small bowel decompressed, unremarkable. Vascular/Lymphatic: Aortic atherosclerosis. No evidence of aneurysm or adenopathy. Reproductive: Uterus and adnexa unremarkable.  No mass. Other: No free fluid or free air. Musculoskeletal: No acute bony abnormality. IMPRESSION: Circumferential wall thickening in the distal esophagus suggesting esophagitis. Punctate right upper pole nephrolithiasis.  No hydronephrosis. Sigmoid diverticulosis. Aortic atherosclerosis. No acute findings. Electronically Signed   By: Charlett Nose M.D.   On: 08/17/2022 19:06    EKG: I independently viewed the EKG done and my findings are as followed: Sinus tachycardia rate of 135.  Nonspecific ST-T changes.  QTc 492.  Assessment/Plan Present on Admission:  Intractable nausea and vomiting  Principal Problem:   Intractable nausea and vomiting  Intractable nausea and vomiting, POA Findings suggestive of distal esophagitis on CT scan Start Protonix 40 mg twice daily Gentle IV fluid hydration LR 75 cc/h x 1 day IV antiemetics as needed  Full liquid diet, advance as tolerated [facility] consulted  Type 2 diabetes with hyperglycemia Obtain hemoglobin A1c Start insulin sliding scale.  Sinus tachycardia, suspect secondary to dehydration, volume depletion Continue IV fluid hydration Monitor on telemetry.  Resolved loose stools No recurrent loose stools, no bowel movement in the last 2 to 3 days.  Diabetic polyneuropathy Resume home gabapentin  Hyperlipidemia Resume home Crestor  Chronic anxiety/depression Resume home Prozac.  Generalized weakness PT assessment Fall precautions     DVT prophylaxis: Subcu Lovenox daily  Code Status: Full code.  Family Communication: Updated her husband at bedside.  Disposition Plan: Admitted to telemetry unit  Consults called: [facility], Dr. Barron Alvine  Admission status: Inpatient status.   Status is: Inpatient The patient requires at least 2 midnights for further evaluation and treatment of present condition.  Darlin Drop MD [facility] Pager [phone]  If 7PM-7AM, please contact night-coverage [email or web address] Password [email or web address]  08/18/2022, 3:05 AM

## 2022-08-18 NOTE — Progress Notes (Signed)
Initial Nutrition Assessment  DOCUMENTATION CODES:   Underweight  INTERVENTION:   -Will provide "Carbohydrate Counting" handout in AVS  -Will place referral for outpatient DM education   -Ensure MAX Protein po BID, each supplement provides 150 kcal and 30 grams of protein   -Multivitamin with minerals daily  NUTRITION DIAGNOSIS:   Inadequate oral intake related to nausea, vomiting as evidenced by per patient/family report.  GOAL:   Patient will meet greater than or equal to 90% of their needs  MONITOR:   PO intake, Supplement acceptance, Labs, Weight trends, I & O's  REASON FOR ASSESSMENT:    (Low BMI)    ASSESSMENT:   67 y.o. female with medical history significant for type 2 diabetes, diabetic polyneuropathy, hyperlipidemia, nonbleeding internal hemorrhoids, rectal polyp, who presented to [facility] ED from home due to persistent nausea and vomiting, progressively worsening over the last 3 days.  Patient in room, sleeping. Husband at bedside. Pt would not waken to participate in conversation. Per husband, pt was eating normally until 3 days ago when N/V started. She was unable to hold anything down. She found out in the last year she had diabetes and did not receive any diet education that he is aware of . Pt has been trying to change her diet and eat better, was unable to say what these changes were. Pt doesn't eat much meat, mainly fish.  States he thinks pt may be open to outpatient referral to dietitian once she is out of the hospital, will place.  Will add Ensure Max given increased needs and weight loss lately.   Per weight records, pt has lost 18 lbs since July 2023. Pt's husband states pt was attempting to lose weight initially but lately weight loss has been unintentional given symptoms.  Medications: Multivitamin with minerals daily, Lactated ringers, Compazine  Labs reviewed: CBGs: 127-207   NUTRITION - FOCUSED PHYSICAL EXAM:  Unable to complete, pt sleeping  and wouldn't wake up.  Diet Order:   Diet Order             Diet full liquid Room service appropriate? Yes; Fluid consistency: Thin  Diet effective now                   EDUCATION NEEDS:   Education needs have been addressed  Skin:  Skin Assessment: Reviewed RN Assessment  Last BM:  5/28  Height:   Ht Readings from Last 1 Encounters:  08/18/22 5\' 5"  (1.651 m)    Weight:   Wt Readings from Last 1 Encounters:  08/18/22 50.2 kg    BMI:  Body mass index is 18.42 kg/m.  Estimated Nutritional Needs:   Kcal:  1700-1900  Protein:  75-85g  Fluid:  1.7L/day  Tilda Franco, MS, RD, LDN Inpatient Clinical Dietitian Contact information available via Amion

## 2022-08-18 NOTE — Progress Notes (Addendum)
PROGRESS NOTE    Joann Campos  WUJ:[id number] DOB: 25-Mar-1955 DOA: 08/17/2022 PCP: Modena Slater, DO   Brief Narrative:  Joann Campos is a 67 y.o. female who follows with internal medicine residency program with medical history significant for type 2 diabetes, diabetic polyneuropathy, hyperlipidemia, nonbleeding internal hemorrhoids, rectal polyp, who presented to [facility] ED from home due to persistent nausea and vomiting, progressively worsening over the last 3 days.   Assessment & Plan:   Principal Problem:   DKA (diabetic ketoacidosis) (HCC) Active Problems:   Diabetic neuropathy (HCC)   Mixed hyperlipidemia   Type 2 diabetes mellitus with hyperlipidemia (HCC)   Grade III internal hemorrhoids   Depression   Intractable nausea and vomiting   DKA in the setting of poorly controlled type 2 diabetes, non-insulin-dependent Anion gap metabolic acidosis -Patient's beta hydroxybutyric acid markedly elevated greater than 8 -A1c 7.6 -Gap closed, patient is not on insulin dependent but had noted urinary ketones as well -No indication for insulin drip at this time but will continue sliding scale insulin as necessary -Continue IV fluids, advance diet as tolerated.  Intractable nausea and vomiting, POA Reactive esophagitis -Likely second [place] to intractable nausea vomiting in the setting of above -No indication for antibiotics -Appreciate [facility] insight and recommendations -Continue PPI, antiemetics - advance diet as tolerated  Sinus tachycardia, suspect secondary to dehydration, volume depletion Continue IV fluid hydration Monitor on telemetry.   Resolved loose stools No recurrent loose stools, no bowel movement in the last 2 to 3 days.   Diabetic polyneuropathy Resume home gabapentin   Hyperlipidemia Resume home Crestor   Chronic anxiety/depression Resume home Prozac.   Generalized weakness PT assessment Fall precautions  Moderate protein caloric  malnutrition -Continue to advance diet as tolerated  DVT prophylaxis: Lovenox Code Status: Full Family Communication: At bedside  Status is: Inpatient  Dispo: The patient is from: Home              Anticipated d/c is to: Home              Anticipated d/c date is: 24 to 48 hours              Patient currently not medically stable for discharge  Consultants:  GI  Procedures:  None  Antimicrobials:  None indicated  Subjective: No acute issues or events overnight, nausea vomiting ongoing but improving, no further complaints of diarrhea constipation headache fevers chills or chest pain  Objective: Vitals:   08/18/22 0400 08/18/22 0444 08/18/22 0450 08/18/22 0933  BP: (!) 145/72  138/85 117/73  Pulse: (!) 130  (!) 112 (!) 111  Resp: 18  16 18   Temp:   98.1 F (36.7 C) 98.8 F (37.1 C)  TempSrc:   Oral Oral  SpO2: 94%  98% 99%  Weight:  50.2 kg    Height:  5\' 5"  (1.651 m)      Intake/Output Summary (Last 24 hours) at 08/18/2022 1140 Last data filed at 08/18/2022 0516 Gross per 24 hour  Intake 2550 ml  Output --  Net 2550 ml   Filed Weights   08/18/22 0444  Weight: 50.2 kg    Examination:  General exam: Appears calm and comfortable  Respiratory system: Clear to auscultation. Respiratory effort normal. Cardiovascular system: S1 & S2 heard, RRR. No JVD, murmurs, rubs, gallops or clicks. No pedal edema. Gastrointestinal system: Abdomen is nondistended, soft and nontender. No organomegaly or masses felt. Normal bowel sounds heard. Central nervous system: Alert and oriented. No  focal neurological deficits. Extremities: Symmetric 5 x 5 power. Skin: No rashes, lesions or ulcers Psychiatry: Judgement and insight appear normal. Mood & affect appropriate.     Data Reviewed: I have personally reviewed following labs and imaging studies  CBC: Recent Labs  Lab 08/17/22 1719 08/18/22 0517  WBC 11.0* 10.5  NEUTROABS 9.5*  --   HGB 14.9 11.8*  HCT 44.8 36.5  MCV  86.3 89.0  PLT 378 282   Basic Metabolic Panel: Recent Labs  Lab 08/17/22 1719 08/18/22 0020 08/18/22 0517  NA 141 142 140  K 4.2 3.5 3.5  CL 101 112* 111  CO2 18* 15* 14*  GLUCOSE 227* 161* 153*  BUN 53* 42* 38*  CREATININE 1.28* 0.90 0.94  CALCIUM 10.2 8.5* 8.1*  MG  --   --  2.2  PHOS  --   --  3.1   GFR: Estimated Creatinine Clearance: 47.9 mL/min (by C-G formula based on SCr of 0.94 mg/dL). Liver Function Tests: Recent Labs  Lab 08/17/22 1719  AST 26  ALT 23  ALKPHOS 55  BILITOT 1.5*  PROT 9.4*  ALBUMIN 5.4*   Recent Labs  Lab 08/17/22 1719  LIPASE 100*   No results for input(s): "AMMONIA" in the last 168 hours. Coagulation Profile: No results for input(s): "INR", "PROTIME" in the last 168 hours. Cardiac Enzymes: No results for input(s): "CKTOTAL", "CKMB", "CKMBINDEX", "TROPONINI" in the last 168 hours. BNP (last 3 results) No results for input(s): "PROBNP" in the last 8760 hours. HbA1C: No results for input(s): "HGBA1C" in the last 72 hours. CBG: Recent Labs  Lab 08/17/22 1657  GLUCAP 207*   Lipid Profile: No results for input(s): "CHOL", "HDL", "LDLCALC", "TRIG", "CHOLHDL", "LDLDIRECT" in the last 72 hours. Thyroid Function Tests: No results for input(s): "TSH", "T4TOTAL", "FREET4", "T3FREE", "THYROIDAB" in the last 72 hours. Anemia Panel: No results for input(s): "VITAMINB12", "FOLATE", "FERRITIN", "TIBC", "IRON", "RETICCTPCT" in the last 72 hours. Sepsis Labs: No results for input(s): "PROCALCITON", "LATICACIDVEN" in the last 168 hours.  No results found for this or any previous visit (from the past 240 hour(s)).       Radiology Studies: CT ABDOMEN PELVIS W CONTRAST  Result Date: 08/17/2022 CLINICAL DATA:  Abdominal pain, acute, nonlocalized. Nausea, vomiting, diarrhea EXAM: CT ABDOMEN AND PELVIS WITH CONTRAST TECHNIQUE: Multidetector CT imaging of the abdomen and pelvis was performed using the standard protocol following bolus  administration of intravenous contrast. RADIATION DOSE REDUCTION: This exam was performed according to the departmental dose-optimization program which includes automated exposure control, adjustment of the mA and/or kV according to patient size and/or use of iterative reconstruction technique. CONTRAST:  80mL OMNIPAQUE IOHEXOL 300 MG/ML  SOLN COMPARISON:  None Available. FINDINGS: Lower chest: Mild circumferential wall thickening in the distal esophagus suggesting esophagitis. No confluent airspace opacities or effusions. Hepatobiliary: Prior cholecystectomy. 6 mm hypodensity anteriorly in the liver, likely small cyst. No suspicious focal hepatic abnormality. Pancreas: No focal abnormality or ductal dilatation. Spleen: No focal abnormality.  Normal size. Adrenals/Urinary Tract: No suspicious renal or adrenal mass. No hydronephrosis. Punctate 1 mm nonobstructing stone in the upper pole of the right kidney. No ureteral stones. Urinary bladder unremarkable. Stomach/Bowel: Normal appendix. Sigmoid diverticulosis. No active diverticulitis. Stomach and small bowel decompressed, unremarkable. Vascular/Lymphatic: Aortic atherosclerosis. No evidence of aneurysm or adenopathy. Reproductive: Uterus and adnexa unremarkable.  No mass. Other: No free fluid or free air. Musculoskeletal: No acute bony abnormality. IMPRESSION: Circumferential wall thickening in the distal esophagus suggesting esophagitis. Punctate right upper  pole nephrolithiasis.  No hydronephrosis. Sigmoid diverticulosis. Aortic atherosclerosis. No acute findings. Electronically Signed   By: Charlett Nose M.D.   On: 08/17/2022 19:06    Scheduled Meds:  enoxaparin (LOVENOX) injection  40 mg Subcutaneous Q24H   FLUoxetine  10 mg Oral Daily   gabapentin  300 mg Oral QHS   multivitamin with minerals  1 tablet Oral Daily   pantoprazole (PROTONIX) IV  40 mg Intravenous BID   Ensure Max Protein  11 oz Oral BID   rosuvastatin  20 mg Oral Daily   Continuous  Infusions:  lactated ringers 125 mL/hr at 08/18/22 0745     LOS: 0 days   Time spent:  Azucena Fallen, DO [facility]  If 7PM-7AM, please contact night-coverage [email or web address]  08/18/2022, 11:40 AM

## 2022-08-19 DIAGNOSIS — R112 Nausea with vomiting, unspecified: Secondary | ICD-10-CM | POA: Diagnosis not present

## 2022-08-19 DIAGNOSIS — R933 Abnormal findings on diagnostic imaging of other parts of digestive tract: Secondary | ICD-10-CM | POA: Diagnosis not present

## 2022-08-19 DIAGNOSIS — E1110 Type 2 diabetes mellitus with ketoacidosis without coma: Secondary | ICD-10-CM | POA: Diagnosis not present

## 2022-08-19 DIAGNOSIS — F32A Depression, unspecified: Secondary | ICD-10-CM | POA: Diagnosis not present

## 2022-08-19 DIAGNOSIS — R197 Diarrhea, unspecified: Secondary | ICD-10-CM

## 2022-08-19 LAB — CBC
HCT: 33.4 % — ABNORMAL LOW (ref 36.0–46.0)
Hemoglobin: 10.8 g/dL — ABNORMAL LOW (ref 12.0–15.0)
MCH: 28.3 pg (ref 26.0–34.0)
MCHC: 32.3 g/dL (ref 30.0–36.0)
MCV: 87.4 fL (ref 80.0–100.0)
Platelets: 233 10*3/uL (ref 150–400)
RBC: 3.82 MIL/uL — ABNORMAL LOW (ref 3.87–5.11)
RDW: 13.9 % (ref 11.5–15.5)
WBC: 7.5 10*3/uL (ref 4.0–10.5)
nRBC: 0 % (ref 0.0–0.2)

## 2022-08-19 LAB — GLUCOSE, CAPILLARY
Glucose-Capillary: 130 mg/dL — ABNORMAL HIGH (ref 70–99)
Glucose-Capillary: 105 mg/dL — ABNORMAL HIGH (ref 70–99)

## 2022-08-19 LAB — BASIC METABOLIC PANEL
Anion gap: 10 (ref 5–15)
BUN: 25 mg/dL — ABNORMAL HIGH (ref 8–23)
CO2: 18 mmol/L — ABNORMAL LOW (ref 22–32)
Calcium: 8.4 mg/dL — ABNORMAL LOW (ref 8.9–10.3)
Chloride: 109 mmol/L (ref 98–111)
Creatinine, Ser: 0.68 mg/dL (ref 0.44–1.00)
GFR, Estimated: >60 mL/min (ref >60)
Glucose, Bld: 120 mg/dL — ABNORMAL HIGH (ref 70–99)
Potassium: 2.9 mmol/L — ABNORMAL LOW (ref 3.5–5.1)
Sodium: 137 mmol/L (ref 135–145)

## 2022-08-19 MED ORDER — POTASSIUM CHLORIDE CRYS ER 20 MEQ PO TBCR
40.0000 meq | EXTENDED_RELEASE_TABLET | ORAL | Status: DC
  Administered 2022-08-19: 40 meq via ORAL
  Filled 2022-08-19: qty 2

## 2022-08-19 MED ORDER — PROCHLORPERAZINE MALEATE 10 MG PO TABS: 10.0000 mg | ORAL_TABLET | Freq: Four times a day (QID) | ORAL | 0 refills | Status: DC | PRN

## 2022-08-19 NOTE — Evaluation (Signed)
Physical Therapy Evaluation Patient Details Name: Joann Campos MRN: [id number] DOB: 05-30-1955 Today's Date: 08/19/2022  History of Present Illness  67 y.o. female with medical history significant for type 2 diabetes, diabetic polyneuropathy, hyperlipidemia, nonbleeding internal hemorrhoids, rectal polyp, who presented to [facility] ED from home due to persistent nausea and vomiting, progressively worsening over the last 3 days. Dx of DKA, distal esophagitis.  Clinical Impression  Pt admitted with above diagnosis. Pt ambulated 47' with hand held assist of 1, no loss of balance, distance limited by fatigue. At baseline she ambulates independently. I expect she'll return to her baseline and will be able to DC home without PT follow up.  Pt currently with functional limitations due to the deficits listed below (see PT Problem List). Pt will benefit from acute skilled PT to increase their independence and safety with mobility to allow discharge.          Recommendations for follow up therapy are one component of a multi-disciplinary discharge planning process, led by the attending physician.  Recommendations may be updated based on patient status, additional functional criteria and insurance authorization.  Follow Up Recommendations       Assistance Recommended at Discharge Set up Supervision/Assistance  Patient can return home with the following  A little help with bathing/dressing/bathroom;Assistance with cooking/housework;Assist for transportation;Help with stairs or ramp for entrance    Equipment Recommendations None recommended by PT  Recommendations for Other Services       Functional Status Assessment Patient has had a recent decline in their functional status and demonstrates the ability to make significant improvements in function in a reasonable and predictable amount of time.     Precautions / Restrictions Precautions Precautions: Fall Restrictions Weight Bearing Restrictions: No       Mobility  Bed Mobility Overal bed mobility: Needs Assistance Bed Mobility: Supine to Sit     Supine to sit: Min assist     General bed mobility comments: min A to raise trunk    Transfers Overall transfer level: Needs assistance Equipment used: None Transfers: Sit to/from Stand Sit to Stand: Min guard                Ambulation/Gait Ambulation/Gait assistance: Supervision Gait Distance (Feet): 70 Feet Assistive device: 1 person hand held assist Gait Pattern/deviations: Step-through pattern, Decreased stride length Gait velocity: decr     General Gait Details: steady, no loss of balance; distance limited by fatigue; pt declined use of RW  Stairs            Wheelchair Mobility    Modified Rankin (Stroke Patients Only)       Balance Overall balance assessment: Modified Independent                                           Pertinent Vitals/Pain Pain Assessment Pain Assessment: No/denies pain    Home Living Family/patient expects to be discharged to:: Private residence Living Arrangements: Spouse/significant other Available Help at Discharge: Family Type of Home: House Home Access: Level entry       Home Layout: One level   Additional Comments: no AD at baseline    Prior Function Prior Level of Function : Independent/Modified Independent             Mobility Comments: x1 fall "just the other day" lost balance. Able to get up on her own. Attributes fall to  being sick. No other falls in past 6 months ADLs Comments: independent     Hand Dominance   Dominant Hand: Right    Extremity/Trunk Assessment   Upper Extremity Assessment Upper Extremity Assessment: Overall WFL for tasks assessed    Lower Extremity Assessment Lower Extremity Assessment: Overall WFL for tasks assessed    Cervical / Trunk Assessment Cervical / Trunk Assessment: Normal  Communication   Communication: No difficulties  Cognition  Arousal/Alertness: Awake/alert Behavior During Therapy: WFL for tasks assessed/performed Overall Cognitive Status: Within Functional Limits for tasks assessed                                          General Comments      Exercises     Assessment/Plan    PT Assessment Patient needs continued PT services  PT Problem List Decreased mobility;Decreased activity tolerance;Decreased balance       PT Treatment Interventions Gait training;Therapeutic exercise;Patient/family education;Functional mobility training    PT Goals (Current goals can be found in the Care Plan section)  Acute Rehab PT Goals Patient Stated Goal: get back to walking independently PT Goal Formulation: With patient Time For Goal Achievement: 09/02/22 Potential to Achieve Goals: Good    Frequency Min 1X/week     Co-evaluation               AM-PAC PT "6 Clicks" Mobility  Outcome Measure Help needed turning from your back to your side while in a flat bed without using bedrails?: None Help needed moving from lying on your back to sitting on the side of a flat bed without using bedrails?: A Little Help needed moving to and from a bed to a chair (including a wheelchair)?: A Little Help needed standing up from a chair using your arms (e.g., wheelchair or bedside chair)?: A Little Help needed to walk in hospital room?: A Little Help needed climbing 3-5 steps with a railing? : A Little 6 Click Score: 19    End of Session Equipment Utilized During Treatment: Gait belt Activity Tolerance: Patient limited by fatigue Patient left: in chair;with call bell/phone within reach;with family/visitor present Nurse Communication: Mobility status PT Visit Diagnosis: Difficulty in walking, not elsewhere classified (R26.2)    Time: 1308-6578 PT Time Calculation (min) (ACUTE ONLY): 8 min   Charges:   PT Evaluation $PT Eval Moderate Complexity: 1 Mod          Tamala Ser PT  08/19/2022  Acute Rehabilitation Services  Office [phone]

## 2022-08-19 NOTE — TOC Transition Note (Signed)
Transition of Care [facility]) - CM/SW Discharge Note   Patient Details  Name: Alleta Barilla MRN: [id number] Date of Birth: Nov 09, 1955  Transition of Care [facility]) CM/SW Contact:  Lanier Clam, RN Phone Number: 08/19/2022, 11:42 AM   Clinical Narrative: d/c home No orders or CM needs.      Final next level of care: Home/Self Care Barriers to Discharge: No Barriers Identified   Patient Goals and CMS Choice CMS Medicare.gov Compare Post Acute Care list provided to:: Patient Choice offered to / list presented to : Patient  Discharge Placement                         Discharge Plan and Services Additional resources added to the After Visit Summary for     Discharge Planning Services: CM Consult                                 Social Determinants of Health (SDOH) Interventions SDOH Screenings   Food Insecurity: No Food Insecurity (08/18/2022)  Housing: Low Risk  (08/18/2022)  Transportation Needs: No Transportation Needs (08/18/2022)  Utilities: Not At Risk (08/18/2022)  Depression (PHQ2-9): Medium Risk (08/05/2022)  Tobacco Use: Medium Risk (08/17/2022)     Readmission Risk Interventions     No data to display

## 2022-08-19 NOTE — Discharge Summary (Signed)
Physician Discharge Summary  Joann Campos WUX:[id number] DOB: 01/17/1956 DOA: 08/17/2022  PCP: Modena Slater, DO  Admit date: 08/17/2022 Discharge date: 08/19/2022  Admitted From: Home Disposition: Home  Recommendations for Outpatient Follow-up:  Follow up with PCP in 1-2 weeks Please obtain BMP/CBC in one week  Home Health: None Equipment/Devices: None  Discharge Condition: Stable CODE STATUS: Full Diet recommendation: Low-carb bland diet, advance as tolerated  Brief/Interim Summary: Joann Campos is a 67 y.o. female who follows with internal medicine residency program with medical history significant for type 2 diabetes, diabetic polyneuropathy, hyperlipidemia, nonbleeding internal hemorrhoids, rectal polyp, who presented to [facility] ED from home due to persistent nausea and vomiting, progressively worsening over the last 3 days.   Patient admitted as above with apparent DKA given elevated beta hydroxybutyric acid and hyperglycemia.  A1c 7.6 and poorly controlled.  Patient's And symptoms improved drastically with supportive care and IV fluids.  She is now tolerating p.o. quite well without difficulty.  We recommended a soft bland diet for the next few days to ensure appropriate resolution of symptoms but otherwise her esophagitis noted on imaging is likely reactive in the setting of intractable nausea vomiting and DKA.  Discharge Diagnoses:  Principal Problem:   DKA (diabetic ketoacidosis) (HCC) Active Problems:   Diabetic neuropathy (HCC)   Mixed hyperlipidemia   Type 2 diabetes mellitus with hyperlipidemia (HCC)   Grade III internal hemorrhoids   Depression   Nausea vomiting and diarrhea   Abnormal finding on GI tract imaging    Discharge Instructions  Discharge Instructions     Amb Referral to Nutrition and Diabetic Education   Complete by: As directed       Allergies as of 08/19/2022   No Known Allergies      Medication List     TAKE these medications     empagliflozin 25 MG Tabs tablet Commonly known as: Jardiance Take 1 tablet (25 mg total) by mouth daily before breakfast.   FLUoxetine 10 MG capsule Commonly known as: PROZAC Take 1 capsule (10 mg total) by mouth daily.   gabapentin 300 MG capsule Commonly known as: Neurontin Take 2 capsules (600 mg total) by mouth at bedtime.   metFORMIN 500 MG 24 hr tablet Commonly known as: GLUCOPHAGE-XR Take 2 tablets (1,000 mg total) by mouth 2 (two) times daily with a meal.   MULTIVITAMIN ADULTS PO Take 1 tablet by mouth daily. Women over 50 Notes to patient: Last Dose of this Medication was given on Aug 19, 2022 at 9:51 am   prochlorperazine 10 MG tablet Commonly known as: COMPAZINE Take 1 tablet (10 mg total) by mouth every 6 (six) hours as needed for nausea or vomiting.   rosuvastatin 20 MG tablet Commonly known as: Crestor Take 1 tablet (20 mg total) by mouth daily. Notes to patient: Last Dose of this Medication was given on Aug 19, 2022 at 9:51 am   VITAMIN B 12 PO Take 1 tablet by mouth daily. One tablet        Follow-up Information     Cirigliano, Vito V, DO Follow up on 09/14/2022.   Specialty: Gastroenterology Why: appt is at 10am. please arrive 10-15 minutes prior to appt time. Contact information: [address] [place] [address] [phone]                No Known Allergies  Consultations: GI  Procedures/Studies: CT ABDOMEN PELVIS W CONTRAST  Result Date: 08/17/2022 CLINICAL DATA:  Abdominal pain, acute, nonlocalized. Nausea, vomiting,  diarrhea EXAM: CT ABDOMEN AND PELVIS WITH CONTRAST TECHNIQUE: Multidetector CT imaging of the abdomen and pelvis was performed using the standard protocol following bolus administration of intravenous contrast. RADIATION DOSE REDUCTION: This exam was performed according to the departmental dose-optimization program which includes automated exposure control, adjustment of the mA and/or kV according to patient size  and/or use of iterative reconstruction technique. CONTRAST:  80mL OMNIPAQUE IOHEXOL 300 MG/ML  SOLN COMPARISON:  None Available. FINDINGS: Lower chest: Mild circumferential wall thickening in the distal esophagus suggesting esophagitis. No confluent airspace opacities or effusions. Hepatobiliary: Prior cholecystectomy. 6 mm hypodensity anteriorly in the liver, likely small cyst. No suspicious focal hepatic abnormality. Pancreas: No focal abnormality or ductal dilatation. Spleen: No focal abnormality.  Normal size. Adrenals/Urinary Tract: No suspicious renal or adrenal mass. No hydronephrosis. Punctate 1 mm nonobstructing stone in the upper pole of the right kidney. No ureteral stones. Urinary bladder unremarkable. Stomach/Bowel: Normal appendix. Sigmoid diverticulosis. No active diverticulitis. Stomach and small bowel decompressed, unremarkable. Vascular/Lymphatic: Aortic atherosclerosis. No evidence of aneurysm or adenopathy. Reproductive: Uterus and adnexa unremarkable.  No mass. Other: No free fluid or free air. Musculoskeletal: No acute bony abnormality. IMPRESSION: Circumferential wall thickening in the distal esophagus suggesting esophagitis. Punctate right upper pole nephrolithiasis.  No hydronephrosis. Sigmoid diverticulosis. Aortic atherosclerosis. No acute findings. Electronically Signed   By: Charlett Nose M.D.   On: 08/17/2022 19:06     Subjective: No acute issues or events overnight denies nausea vomiting diarrhea constipation headache fevers chills or chest pain   Discharge Exam: Vitals:   08/19/22 0559 08/19/22 1007  BP: 112/83 133/75  Pulse: (!) 121 94  Resp: 16 15  Temp: 98.2 F (36.8 C) 98.7 F (37.1 C)  SpO2: 99% 98%   Vitals:   08/18/22 1309 08/18/22 2121 08/19/22 0559 08/19/22 1007  BP: 115/77 (!) 180/85 112/83 133/75  Pulse: (!) 111 (!) 114 (!) 121 94  Resp: 16  16 15   Temp: 100 F (37.8 C) 98.6 F (37 C) 98.2 F (36.8 C) 98.7 F (37.1 C)  TempSrc: Oral Oral Oral Oral   SpO2: 100% 100% 99% 98%  Weight:      Height:        General: Pt is alert, awake, not in acute distress Cardiovascular: RRR, S1/S2 +, no rubs, no gallops Respiratory: CTA bilaterally, no wheezing, no rhonchi Abdominal: Soft, NT, ND, bowel sounds + Extremities: no edema, no cyanosis    The results of significant diagnostics from this hospitalization (including imaging, microbiology, ancillary and laboratory) are listed below for reference.     Microbiology: No results found for this or any previous visit (from the past 240 hour(s)).   Labs: BNP (last 3 results) No results for input(s): "BNP" in the last 8760 hours. Basic Metabolic Panel: Recent Labs  Lab 08/17/22 1719 08/18/22 0020 08/18/22 0517 08/19/22 0544  NA 141 142 140 137  K 4.2 3.5 3.5 2.9*  CL 101 112* 111 109  CO2 18* 15* 14* 18*  GLUCOSE 227* 161* 153* 120*  BUN 53* 42* 38* 25*  CREATININE 1.28* 0.90 0.94 0.68  CALCIUM 10.2 8.5* 8.1* 8.4*  MG  --   --  2.2  --   PHOS  --   --  3.1  --    Liver Function Tests: Recent Labs  Lab 08/17/22 1719  AST 26  ALT 23  ALKPHOS 55  BILITOT 1.5*  PROT 9.4*  ALBUMIN 5.4*   Recent Labs  Lab 08/17/22 1719  LIPASE  100*   No results for input(s): "AMMONIA" in the last 168 hours. CBC: Recent Labs  Lab 08/17/22 1719 08/18/22 0517 08/19/22 0544  WBC 11.0* 10.5 7.5  NEUTROABS 9.5*  --   --   HGB 14.9 11.8* 10.8*  HCT 44.8 36.5 33.4*  MCV 86.3 89.0 87.4  PLT 378 282 233   CBG: Recent Labs  Lab 08/17/22 1657 08/18/22 1456 08/18/22 2219 08/19/22 0733  GLUCAP 207* 133* 130* 105*   Urinalysis    Component Value Date/Time   COLORURINE STRAW (A) 08/17/2022 2120   APPEARANCEUR CLEAR 08/17/2022 2120   LABSPEC 1.039 (H) 08/17/2022 2120   PHURINE 5.0 08/17/2022 2120   GLUCOSEU >=500 (A) 08/17/2022 2120   HGBUR NEGATIVE 08/17/2022 2120   BILIRUBINUR NEGATIVE 08/17/2022 2120   KETONESUR 80 (A) 08/17/2022 2120   PROTEINUR 100 (A) 08/17/2022 2120    UROBILINOGEN 0.2 11/04/2019 1746   NITRITE NEGATIVE 08/17/2022 2120   LEUKOCYTESUR NEGATIVE 08/17/2022 2120   Sepsis Labs Recent Labs  Lab 08/17/22 1719 08/18/22 0517 08/19/22 0544  WBC 11.0* 10.5 7.5   Microbiology No results found for this or any previous visit (from the past 240 hour(s)).   Time coordinating discharge: Over 30 minutes  SIGNED:   Azucena Fallen, DO [facility] 08/19/2022, 12:03 PM Pager   If 7PM-7AM, please contact night-coverage [email or web address]

## 2022-08-19 NOTE — Progress Notes (Signed)
Patient to be discharged to home today. Patient and Patient's Husband Annette Stable given discharge instructions including all discharge Medications and schedules for these Medications. Understanding verbalized by Patient and Patient's Husband. Discharge AVS with the Patient at time of discharge.

## 2022-08-23 ENCOUNTER — Telehealth: Payer: Self-pay

## 2022-08-23 NOTE — Telephone Encounter (Signed)
-----   Message from Shellia Cleverly, DO sent at 08/22/2022  7:50 AM EDT ----- Regarding: RE: Hospital follow up Thanks for the heads up. Not sure why she would refuse the EGD while there.   Kham Zuckerman, can you please reach out to this patient to see how she is feeling and can we try to get an EGD added to her flex sig next month? Worst case scenario, sxs are resolved and we cancel, but I'd rather try to carve out the spot for her.   Thanks!  ----- Message ----- From: Jenel Lucks, MD Sent: 08/19/2022   2:20 PM EDT To: Shellia Cleverly, DO Subject: Hospital follow up                             Hey bud,  We were consulted on this lady yesterday for refractory nausea/vomiting, found to have esophageal thickening on CT.  She refused an EGD during her hospitalization, but she scheduled to see you in a few weeks and so may want to readdress that.  Looks like she is already scheduled for a flex sig in the hospital in July, don't know if you would be able to add an EGD on.  Thanks,  [employer]

## 2022-08-23 NOTE — Transitions of Care (Post Inpatient/ED Visit) (Signed)
   08/23/2022  Name: Joann Campos MRN: [id number] DOB: 17-Jun-1955  Today's TOC FU Call Status: Today's TOC FU Call Status:: Successful TOC FU Call Competed TOC FU Call Complete Date: 08/23/22  Transition Care Management Follow-up Telephone Call Date of Discharge: 08/19/22 Discharge Facility: [facility]) Type of Discharge: Inpatient Admission Primary Inpatient Discharge Diagnosis:: nausea How have you been since you were released from the hospital?: Better Any questions or concerns?: No  Items Reviewed: Did you receive and understand the discharge instructions provided?: Yes Medications obtained,verified, and reconciled?: Yes (Medications Reviewed) Any new allergies since your discharge?: No Dietary orders reviewed?: Yes Do you have support at home?: Yes People in Home: spouse  Medications Reviewed Today: Medications Reviewed Today     Reviewed by Karena Addison, LPN (Licensed Practical Nurse) on 08/23/22 at [phone]  Med List Status: <None>   Medication Order Taking? Sig Documenting Provider Last Dose Status Informant  Cyanocobalamin (VITAMIN B 12 PO) [id number] Yes Take 1 tablet by mouth daily. One tablet [provider] Taking Active Self, Pharmacy Records  empagliflozin (JARDIANCE) 25 MG TABS tablet [id number] Yes Take 1 tablet (25 mg total) by mouth daily before breakfast. Modena Slater, DO Taking Active Self, Pharmacy Records  FLUoxetine (PROZAC) 10 MG capsule [id number] Yes Take 1 capsule (10 mg total) by mouth daily. Modena Slater, DO Taking Active Self, Pharmacy Records  gabapentin (NEURONTIN) 300 MG capsule [id number] Yes Take 2 capsules (600 mg total) by mouth at bedtime. Modena Slater, DO Taking Active Self, Pharmacy Records           Med Note Lia Hopping, Linton Rump Aug 18, 2022 12:51 AM) Per pt she takes 600mg  at bedtime  metFORMIN (GLUCOPHAGE-XR) 500 MG 24 hr tablet [id number] Yes Take 2 tablets (1,000 mg total) by mouth 2 (two) times daily with a meal. Modena Slater, DO Taking Active Self, Pharmacy Records  Multiple Vitamins-Minerals (MULTIVITAMIN ADULTS PO) [id number] Yes Take 1 tablet by mouth daily. Women over 50 [provider] Taking Active Self, Pharmacy Records  prochlorperazine (COMPAZINE) 10 MG tablet [id number] Yes Take 1 tablet (10 mg total) by mouth every 6 (six) hours as needed for nausea or vomiting. Azucena Fallen, MD Taking Active   rosuvastatin (CRESTOR) 20 MG tablet [id number] Yes Take 1 tablet (20 mg total) by mouth daily. Modena Slater, DO Taking Active Self, Pharmacy Records            Home Care and Equipment/Supplies: Were Home Health Services Ordered?: NA Any new equipment or medical supplies ordered?: NA  Functional Questionnaire: Do you need assistance with bathing/showering or dressing?: No Do you need assistance with meal preparation?: No Do you need assistance with eating?: No Do you have difficulty maintaining continence: No Do you need assistance with getting out of bed/getting out of a chair/moving?: No Do you have difficulty managing or taking your medications?: No  Follow up appointments reviewed: PCP Follow-up appointment confirmed?: No (no avail appt times, sent message to staff to schedule) Specialist Hospital Follow-up appointment confirmed?: NA Do you need transportation to your follow-up appointment?: No Do you understand care options if your condition(s) worsen?: Yes-patient verbalized understanding    SIGNATURE Karena Addison, LPN [facility] Nurse Health Advisor Direct Dial [phone]

## 2022-08-23 NOTE — Telephone Encounter (Signed)
EGD has been added on to 09/28/22 flex sig at [facility]   I called and spoke with patient regarding the plan. Pt understands that her follow up appt is on 09/14/22 to see Dr. Salena Saner. We have added EGD as placeholder. Pt understands that if her symptoms are still resolved at the time of her office visit we will cancel EGD portion. As of right now patient states that she is no longer having any nausea or vomiting. Pt verbalized understanding and had no concerns at the end of the call.

## 2022-08-31 ENCOUNTER — Other Ambulatory Visit: Payer: Self-pay

## 2022-08-31 ENCOUNTER — Ambulatory Visit (INDEPENDENT_AMBULATORY_CARE_PROVIDER_SITE_OTHER): Payer: 59

## 2022-08-31 VITALS — BP 100/64 | HR 64 | Temp 97.7°F | Ht 65.0 in | Wt 116.3 lb

## 2022-08-31 DIAGNOSIS — E119 Type 2 diabetes mellitus without complications: Secondary | ICD-10-CM

## 2022-08-31 DIAGNOSIS — E1169 Type 2 diabetes mellitus with other specified complication: Secondary | ICD-10-CM | POA: Diagnosis not present

## 2022-08-31 DIAGNOSIS — E785 Hyperlipidemia, unspecified: Secondary | ICD-10-CM | POA: Diagnosis not present

## 2022-08-31 DIAGNOSIS — D582 Other hemoglobinopathies: Secondary | ICD-10-CM

## 2022-08-31 DIAGNOSIS — F32A Depression, unspecified: Secondary | ICD-10-CM | POA: Diagnosis not present

## 2022-08-31 DIAGNOSIS — I959 Hypotension, unspecified: Secondary | ICD-10-CM | POA: Diagnosis not present

## 2022-08-31 DIAGNOSIS — Z124 Encounter for screening for malignant neoplasm of cervix: Secondary | ICD-10-CM

## 2022-08-31 DIAGNOSIS — Z7984 Long term (current) use of oral hypoglycemic drugs: Secondary | ICD-10-CM

## 2022-08-31 DIAGNOSIS — Z794 Long term (current) use of insulin: Secondary | ICD-10-CM

## 2022-08-31 DIAGNOSIS — R933 Abnormal findings on diagnostic imaging of other parts of digestive tract: Secondary | ICD-10-CM

## 2022-08-31 MED ORDER — INSULIN GLARGINE 100 UNIT/ML SOLOSTAR PEN: 10.0000 [IU] | PEN_INJECTOR | Freq: Every day | SUBCUTANEOUS | 1 refills | Status: DC

## 2022-08-31 MED ORDER — MICROLET LANCETS MISC: 1.0000 | Freq: Every day | 0 refills | Status: DC

## 2022-08-31 MED ORDER — CONTOUR NEXT TEST VI STRP: ORAL_STRIP | 12 refills | Status: DC

## 2022-08-31 NOTE — Assessment & Plan Note (Signed)
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  She denies any blood in school.  She did have a mild anemia during recent hospitalization.  She is scheduled for colonoscopy later this month. -Repeat CBC

## 2022-08-31 NOTE — Assessment & Plan Note (Signed)
Patient presents with a history of hypertension.  MAP today initially 64 but repeat blood pressure 100/64.  She did have some lightheadedness this morning but this is not typical for her.  She is drinking water during the visit feel that her low blood pressure is due to dehydration, especially since increasing dose of Jardiance.  She endorses good fluid intake otherwise but we discussed the importance of watching for signs of dehydration and drinking appropriate amount of water. -Continue oral hydration -Discontinue Jardiance (see diabetes problem)

## 2022-08-31 NOTE — Assessment & Plan Note (Addendum)
Will get Pap at next visit.  Should be her last period

## 2022-08-31 NOTE — Progress Notes (Signed)
CC: HFU  HPI:  [name] is a 67 y.o. female with past medical history as below who presents for hospital follow-up.  Please see detailed assessment and plan for HPI.  Past Medical History:  Diagnosis Date   Diabetes (HCC) 09/2021   Hip pain 2021   Hyperlipidemia    Review of Systems: Please see detailed assessment plan for pertinent ROS.  Physical Exam:  Vitals:   08/31/22 0928 08/31/22 1024  BP: (!) 83/55 100/64  Pulse: (!) 103 64  Temp: 97.7 F (36.5 C)   TempSrc: Oral   SpO2: 100%   Weight: 116 lb 4.8 oz (52.8 kg)   Height: 5\' 5"  (1.651 m)    Physical Exam Constitutional:      General: She is not in acute distress. HENT:     Head: Normocephalic and atraumatic.  Eyes:     Extraocular Movements: Extraocular movements intact.  Cardiovascular:     Rate and Rhythm: Normal rate and regular rhythm.     Pulses: Normal pulses.     Heart sounds: No murmur heard. Pulmonary:     Effort: Pulmonary effort is normal.     Breath sounds: No wheezing or rales.  Musculoskeletal:     Cervical back: Neck supple.     Right lower leg: No edema.     Left lower leg: No edema.  Skin:    General: Skin is warm and dry.     Comments: Reduced skin turgor.  Neurological:     Mental Status: She is alert and oriented to person, place, and time.  Psychiatric:        Mood and Affect: Mood normal.        Behavior: Behavior normal.      Assessment & Plan:   See Encounters Tab for problem based charting.  Hypotension Patient presents with a history of hypertension.  MAP today initially 64 but repeat blood pressure 100/64.  She did have some lightheadedness this morning but this is not typical for her.  She is drinking water during the visit feel that her low blood pressure is due to dehydration, especially since increasing dose of Jardiance.  She endorses good fluid intake otherwise but we discussed the importance of watching for signs of dehydration and drinking appropriate  amount of water. -Continue oral hydration -Discontinue Jardiance (see diabetes problem)  Type 2 diabetes mellitus with hyperlipidemia (HCC) Patient presents with a history of type 2 diabetes.  She was recently admitted for diabetic ketoacidosis.  Of note, this was after recent increase in [place].  A1c had improved to 7.7 at last follow-up but she is still not at goal.  Today she denies any symptoms consistent with DKA including nausea, vomiting, abdominal pain.  She is alert and oriented and her vitals are normal.  Currently on metformin 1000 mg twice daily and Jardiance 25 mg daily.  I am suspicious for possible LADA versus MODY.  I will order some labs to evaluate this further.  Her BMI is 19.3 and she has minimal abdominal adipose tissue.  Will discontinue Jardiance given recent DKA.  Discussed beginning insulin therapy and reviewed checking blood sugar and administering insulin. -Follow-up antibody testing, C-peptide -Urine microalbumin/creatinine ratio -Discontinue Jardiance -Continue metformin 1000 mg twice daily -Start Lantus 10 units nightly -Return in 1 month for follow-up  Screening for cervical cancer Will get Pap at next visit.  Should be her last period  Depression Patient presents with a history of anxiety.  She was initially taking  her Prozac but when she ended up in the hospital with GI symptoms she discontinued and felt this might of precipitated the event.  However, she fortunately was able to restart her Prozac and has been taking since though it is not even been 4 weeks since she restarted.  She has not had any unwanted side effects this time but would like to give it a little bit more time to see if her depression is improved.  We discussed following up in a few weeks to see if current dose begins to take effect and at that time she is still without side effects, we could consider increasing dose.  We also discussed that counseling may be helpful for her. 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  She denies any blood in school.  She did have a mild anemia during recent hospitalization.  She is scheduled for colonoscopy later this month. -Repeat CBC  Patient discussed with Dr. Mikey Bussing

## 2022-08-31 NOTE — Patient Instructions (Addendum)
Ms.Sissy Rawdon, it was a pleasure seeing you today!  Today we discussed: Diabetes - will call about any worrisome lab results. Otherwise, take Lantus 10 u daily. Stop taking Jardiance. Continue metformin. Test blood sugar once every morning. Blood pressure  - continue drinking lots of water! Depression - continue on current dose of prozac. Will discuss at follow up in a month.  Will perform Pap smear at next visit.  I have ordered the following labs today:  Lab Orders         CBC no Diff         BMP8+Anion Gap         Microalbumin / Creatinine Urine Ratio         Glutamic acid decarboxylase auto abs         Anti-islet cell antibody         ZNT8 Antibodies         CYCLIC CITRUL PEPTIDE ANTIBODY, IGG/IGA      Tests ordered today:  none  Referrals ordered today:   Referral Orders  No referral(s) requested today     I have ordered the following medication/changed the following medications:   Stop the following medications: Medications Discontinued During This Encounter  Medication Reason   empagliflozin (JARDIANCE) 25 MG TABS tablet Change in therapy     Start the following medications: Meds ordered this encounter  Medications   insulin glargine (LANTUS) 100 UNIT/ML Solostar Pen    Sig: Inject 10 Units into the skin at bedtime.    Dispense:  15 mL    Refill:  1   Microlet Lancets MISC    Sig: 1 each by Does not apply route daily.    Dispense:  100 each    Refill:  0   glucose blood (CONTOUR NEXT TEST) test strip    Sig: Use as instructed    Dispense:  100 each    Refill:  12     Follow-up:  1 month    Please make sure to arrive 15 minutes prior to your next appointment. If you arrive late, you may be asked to reschedule.   We look forward to seeing you next time. Please call our clinic at [phone] if you have any questions or concerns. The best time to call is Monday-Friday from 9am-4pm, but there is someone available 24/7. If after hours or the  weekend, call the main hospital number and ask for the Internal Medicine Resident On-Call. If you need medication refills, please notify your pharmacy one week in advance and they will send [place] a request.  Thank you for letting us take part in your care. Wishing you the best!  Thank you, Adron Bene, MD

## 2022-08-31 NOTE — Assessment & Plan Note (Signed)
Patient presents with a history of type 2 diabetes.  She was recently admitted for diabetic ketoacidosis.  Of note, this was after recent increase in [place].  A1c had improved to 7.7 at last follow-up but she is still not at goal.  Today she denies any symptoms consistent with DKA including nausea, vomiting, abdominal pain.  She is alert and oriented and her vitals are normal.  Currently on metformin 1000 mg twice daily and Jardiance 25 mg daily.  I am suspicious for possible LADA versus MODY.  I will order some labs to evaluate this further.  Her BMI is 19.3 and she has minimal abdominal adipose tissue.  Will discontinue Jardiance given recent DKA.  Discussed beginning insulin therapy and reviewed checking blood sugar and administering insulin. -Follow-up antibody testing, C-peptide -Urine microalbumin/creatinine ratio -Discontinue Jardiance -Continue metformin 1000 mg twice daily -Start Lantus 10 units nightly -Return in 1 month for follow-up

## 2022-08-31 NOTE — Assessment & Plan Note (Signed)
Patient presents with a history of anxiety.  She was initially taking her Prozac but when she ended up in the hospital with GI symptoms she discontinued and felt this might of precipitated the event.  However, she fortunately was able to restart her Prozac and has been taking since though it is not even been 4 weeks since she restarted.  She has not had any unwanted side effects this time but would like to give it a little bit more time to see if her depression is improved.  We discussed following up in a few weeks to see if current dose begins to take effect and at that time she is still without side effects, we could consider increasing dose.  We also discussed that counseling may be helpful for her. -Continue Prozac 10 mg daily

## 2022-09-01 ENCOUNTER — Telehealth: Payer: Self-pay

## 2022-09-01 LAB — BMP8+ANION GAP: Calcium: 9.9 mg/dL (ref 8.7–10.3)

## 2022-09-01 LAB — MICROALBUMIN / CREATININE URINE RATIO
Creatinine, Urine: 16.3 mg/dL
Microalb/Creat Ratio: <18 mg/g creat (ref 0–29)
Microalbumin, Urine: <3 ug/mL

## 2022-09-01 LAB — CBC
Hematocrit: 37 % (ref 34.0–46.6)
WBC: 10.5 10*3/uL (ref 3.4–10.8)

## 2022-09-01 MED ORDER — PEN NEEDLES 32G X 4 MM MISC: 1.0000 | Freq: Every day | 0 refills | Status: DC

## 2022-09-01 NOTE — Addendum Note (Signed)
Addended by: Adron Bene on: 09/01/2022 10:10 AM   Modules accepted: Orders

## 2022-09-01 NOTE — Telephone Encounter (Signed)
Requesting needle for insulin, please call pt back.

## 2022-09-01 NOTE — Telephone Encounter (Signed)
Call from patient received prescription for Insulin.  Needs to have pen needles called in.  [facility] [address].

## 2022-09-02 LAB — BMP8+ANION GAP
Chloride: 97 mmol/L (ref 96–106)
Glucose: 118 mg/dL — ABNORMAL HIGH (ref 70–99)

## 2022-09-02 LAB — CBC: MCHC: 31.9 g/dL (ref 31.5–35.7)

## 2022-09-03 LAB — ANTI-ISLET CELL ANTIBODY: Islet Cell Ab: NEGATIVE

## 2022-09-03 LAB — BMP8+ANION GAP
Anion Gap: 17 mmol/L (ref 10.0–18.0)
CO2: 24 mmol/L (ref 20–29)
eGFR: 101 mL/min/{1.73_m2} (ref >59)

## 2022-09-03 LAB — CBC
MCH: 28.6 pg (ref 26.6–33.0)
Platelets: 375 10*3/uL (ref 150–450)

## 2022-09-03 LAB — ZNT8 ANTIBODIES

## 2022-09-03 NOTE — Progress Notes (Signed)
Internal Medicine Clinic Attending  Case discussed with the resident at the time of the visit.  We reviewed the resident's history and exam and pertinent patient test results.  I agree with the assessment, diagnosis, and plan of care documented in the resident's note.  

## 2022-09-08 LAB — CBC
Hemoglobin: 11.8 g/dL (ref 11.1–15.9)
MCV: 90 fL (ref 79–97)
RBC: 4.12 x10E6/uL (ref 3.77–5.28)
RDW: 13.8 % (ref 11.7–15.4)

## 2022-09-08 LAB — BMP8+ANION GAP
BUN/Creatinine Ratio: 21 (ref 12–28)
BUN: 12 mg/dL (ref 8–27)
Creatinine, Ser: 0.58 mg/dL (ref 0.57–1.00)
Potassium: 3.8 mmol/L (ref 3.5–5.2)
Sodium: 138 mmol/L (ref 134–144)

## 2022-09-08 LAB — CYCLIC CITRUL PEPTIDE ANTIBODY, IGG/IGA: Cyclic Citrullin Peptide Ab: 5 units (ref 0–19)

## 2022-09-08 LAB — GLUTAMIC ACID DECARBOXYLASE AUTO ABS: Glutamic Acid Decarb Ab: <5 U/mL (ref 0.0–5.0)

## 2022-09-12 ENCOUNTER — Telehealth: Payer: Self-pay

## 2022-09-12 NOTE — Telephone Encounter (Signed)
Spoke with patient to inform the patient that Dr. Barron Alvine is not available on 7/10 for hospital procedure. Patient is rescheduled for 7/25 at 10:45 [facility] ). Stated she may not be available on that day but would keep her appt. till her upcoming OV on 6/26 with Dr. Barron Alvine and will let us know on that day.

## 2022-09-14 ENCOUNTER — Ambulatory Visit: Payer: 59 | Admitting: Gastroenterology

## 2022-09-14 ENCOUNTER — Encounter: Payer: Self-pay | Admitting: Gastroenterology

## 2022-09-14 VITALS — BP 106/70 | HR 97 | Ht 65.0 in | Wt 116.0 lb

## 2022-09-14 DIAGNOSIS — R933 Abnormal findings on diagnostic imaging of other parts of digestive tract: Secondary | ICD-10-CM

## 2022-09-14 DIAGNOSIS — R112 Nausea with vomiting, unspecified: Secondary | ICD-10-CM

## 2022-09-14 DIAGNOSIS — Z86010 Personal history of colonic polyps: Secondary | ICD-10-CM | POA: Diagnosis not present

## 2022-09-14 NOTE — H&P (View-Only) (Signed)
Chief Complaint:    Hospital follow-up, nausea/vomiting, abnormal CT   HPI:     Patient is a 67 y.o. female with a history of diabetes, diabetic polyneuropathy, HLD, presenting to the [facility]astro[facility]ology Clinic for hospital follow-up and to discuss upcoming flexible sigmoidoscopy for short interval surveillance.   Hospital admission in May with nausea/vomiting and DKA.  Admission CT demonstrated circumferential distal esophageal wall thickening.  She declined inpatient EGD in favor of conservative measures and EGD to be done as outpatient.  Currently scheduled for EGD along with flexible sigmoidoscopy on 10/13/2022 at [facility].  Since hospital discharge, nausea/vomiting has resolved and not recurred.  She would like to cancel the upcoming upper endoscopy, but still proceed with flexible sigmoidoscopy as scheduled.  Otherwise back to tolerating normal p.o. intake, no abdominal pain, GERD, dysphagia, change in bowel habits, hematochezia, melena, hematemesis.  Endoscopic History: - 01/04/2022: Colonoscopy: 2 subcentimeter adenomas removed with cold snare, internal hemorrhoids, and a large 40 mm rectal polyp with thick stalk located 10- 12 cm from the anal verge (biopsied: TVA).  Medium size internal hemorrhoids - 03/01/2022: Colonoscopy: 50 mm polyp with thick stalk resected using a combination of epinephrine, Endoloop, and hot snare, then resection of edges cold forceps via avulsion technique (path: TVA with foci of high-grade dysplasia, negative for carcinoma.  Identified margins are negative for adenoma).  Recommended repeat in 6 months via flexible sigmoidoscopy     Latest Ref Rng & Units 08/31/2022   10:39 AM 08/19/2022    5:44 AM 08/18/2022    5:17 AM  CBC  WBC 3.4 - 10.8 x10E3/uL 10.5  7.5  10.5   Hemoglobin 11.1 - 15.9 g/dL 09.8  11.9  14.7   Hematocrit 34.0 - 46.6 % 37.0  33.4  36.5   Platelets 150 - 450 x10E3/uL 375  233  282      Review of systems:     No chest  pain, no SOB, no fevers, no urinary sx   Past Medical History:  Diagnosis Date   Diabetes (HCC) 09/2021   Hip pain 2021   Hyperlipidemia     Patient's surgical history, family medical history, social history, medications and allergies were all reviewed in Epic    Current Outpatient Medications  Medication Sig Dispense Refill   Cyanocobalamin (VITAMIN B 12 PO) Take 1 tablet by mouth daily. One tablet     FLUoxetine (PROZAC) 10 MG capsule Take 1 capsule (10 mg total) by mouth daily. 90 capsule 1   gabapentin (NEURONTIN) 300 MG capsule Take 2 capsules (600 mg total) by mouth at bedtime. 90 capsule 2   glucose blood (CONTOUR NEXT TEST) test strip Use as instructed 100 each 12   insulin glargine (LANTUS) 100 UNIT/ML Solostar Pen Inject 10 Units into the skin at bedtime. 15 mL 1   Insulin Pen Needle (PEN NEEDLES) 32G X 4 MM MISC 1 each by Does not apply route daily. 100 each 0   metFORMIN (GLUCOPHAGE-XR) 500 MG 24 hr tablet Take 2 tablets (1,000 mg total) by mouth 2 (two) times daily with a meal. 360 tablet 3   Microlet Lancets MISC 1 each by Does not apply route daily. 100 each 0   Multiple Vitamins-Minerals (MULTIVITAMIN ADULTS PO) Take 1 tablet by mouth daily. Women over 50     prochlorperazine (COMPAZINE) 10 MG tablet Take 1 tablet (10 mg total) by mouth every 6 (six) hours as needed for nausea or vomiting. 30 tablet 0   rosuvastatin (  CRESTOR) 20 MG tablet Take 1 tablet (20 mg total) by mouth daily. 90 tablet 3   No current facility-administered medications for this visit.    Physical Exam:     BP 106/70   Pulse 97   Ht 5\' 5"  (1.651 m)   Wt 116 lb (52.6 kg)   BMI 19.30 kg/m   GENERAL:  Pleasant female in NAD PSYCH: : Cooperative, normal affect Musculoskeletal:  Normal muscle tone, normal strength NEURO: Alert and oriented x 3, no focal neurologic deficits   IMPRESSION and PLAN:    1) History of colon polyps Colonoscopy x 2 in 2023 with resection of 2 subcentimeter  adenomas along with EMR of a large rectal TVA with high-grade dysplasia.  Due for short interval surveillance.  No lower GI symptoms. - Proceed with flexible sigmoidoscopy on 10/13/2022 at [facility]  2) Nausea/Vomiting Presumably 2/2 DKA.  Symptoms resolved - Will cancel EGD given complete resolution of symptoms - To call if symptoms recur  3) Abnormal imaging study Admission in May with DKA and nausea/vomiting.  CT with distal esophageal wall thickening which correlates well with likely esophagitis from her nausea/vomiting.  Symptoms have since resolved.  Discussed with patient today and she would like to cancel upper endoscopy.  Feel this is a reasonable decision.      The indications, risks, and benefits of flexible sigmoidoscopy were explained to the patient in detail. Risks include but are not limited to bleeding, perforation, adverse reaction to medications, and cardiopulmonary compromise. Sequelae include but are not limited to the possibility of surgery, hospitalization, and mortality. The patient verbalized understanding and wished to proceed. All questions answered.        Verlin Dike Cirigliano ,DO, FACG 09/14/2022, 10:22 AM

## 2022-09-14 NOTE — Progress Notes (Signed)
Chief Complaint:    Hospital follow-up, nausea/vomiting, abnormal CT   HPI:     Patient is a 67 y.o. female with a history of diabetes, diabetic polyneuropathy, HLD, presenting to the Gastroenterology Clinic for hospital follow-up and to discuss upcoming flexible sigmoidoscopy for short interval surveillance.   Hospital admission in May with nausea/vomiting and DKA.  Admission CT demonstrated circumferential distal esophageal wall thickening.  She declined inpatient EGD in favor of conservative measures and EGD to be done as outpatient.  Currently scheduled for EGD along with flexible sigmoidoscopy on 10/13/2022 at [facility] unit.  Since hospital discharge, nausea/vomiting has resolved and not recurred.  She would like to cancel the upcoming upper endoscopy, but still proceed with flexible sigmoidoscopy as scheduled.  Otherwise back to tolerating normal p.o. intake, no abdominal pain, GERD, dysphagia, change in bowel habits, hematochezia, melena, hematemesis.  Endoscopic History: - 01/04/2022: Colonoscopy: 2 subcentimeter adenomas removed with cold snare, internal hemorrhoids, and a large 40 mm rectal polyp with thick stalk located 10- 12 cm from the anal verge (biopsied: TVA).  Medium size internal hemorrhoids - 03/01/2022: Colonoscopy: 50 mm polyp with thick stalk resected using a combination of epinephrine, Endoloop, and hot snare, then resection of edges cold forceps via avulsion technique (path: TVA with foci of high-grade dysplasia, negative for carcinoma.  Identified margins are negative for adenoma).  Recommended repeat in 6 months via flexible sigmoidoscopy     Latest Ref Rng & Units 08/31/2022   10:39 AM 08/19/2022    5:44 AM 08/18/2022    5:17 AM  CBC  WBC 3.4 - 10.8 x10E3/uL 10.5  7.5  10.5   Hemoglobin 11.1 - 15.9 g/dL 09.8  11.9  14.7   Hematocrit 34.0 - 46.6 % 37.0  33.4  36.5   Platelets 150 - 450 x10E3/uL 375  233  282      Review of systems:     No chest  pain, no SOB, no fevers, no urinary sx   Past Medical History:  Diagnosis Date   Diabetes (HCC) 09/2021   Hip pain 2021   Hyperlipidemia     Patient's surgical history, family medical history, social history, medications and allergies were all reviewed in Epic    Current Outpatient Medications  Medication Sig Dispense Refill   Cyanocobalamin (VITAMIN B 12 PO) Take 1 tablet by mouth daily. One tablet     FLUoxetine (PROZAC) 10 MG capsule Take 1 capsule (10 mg total) by mouth daily. 90 capsule 1   gabapentin (NEURONTIN) 300 MG capsule Take 2 capsules (600 mg total) by mouth at bedtime. 90 capsule 2   glucose blood (CONTOUR NEXT TEST) test strip Use as instructed 100 each 12   insulin glargine (LANTUS) 100 UNIT/ML Solostar Pen Inject 10 Units into the skin at bedtime. 15 mL 1   Insulin Pen Needle (PEN NEEDLES) 32G X 4 MM MISC 1 each by Does not apply route daily. 100 each 0   metFORMIN (GLUCOPHAGE-XR) 500 MG 24 hr tablet Take 2 tablets (1,000 mg total) by mouth 2 (two) times daily with a meal. 360 tablet 3   Microlet Lancets MISC 1 each by Does not apply route daily. 100 each 0   Multiple Vitamins-Minerals (MULTIVITAMIN ADULTS PO) Take 1 tablet by mouth daily. Women over 50     prochlorperazine (COMPAZINE) 10 MG tablet Take 1 tablet (10 mg total) by mouth every 6 (six) hours as needed for nausea or vomiting. 30 tablet 0   rosuvastatin (  CRESTOR) 20 MG tablet Take 1 tablet (20 mg total) by mouth daily. 90 tablet 3   No current facility-administered medications for this visit.    Physical Exam:     BP 106/70   Pulse 97   Ht 5\' 5"  (1.651 m)   Wt 116 lb (52.6 kg)   BMI 19.30 kg/m   GENERAL:  Pleasant female in NAD PSYCH: : Cooperative, normal affect Musculoskeletal:  Normal muscle tone, normal strength NEURO: Alert and oriented x 3, no focal neurologic deficits   IMPRESSION and PLAN:    1) History of colon polyps Colonoscopy x 2 in 2023 with resection of 2 subcentimeter  adenomas along with EMR of a large rectal TVA with high-grade dysplasia.  Due for short interval surveillance.  No lower GI symptoms. - Proceed with flexible sigmoidoscopy on 10/13/2022 at [facility]  2) Nausea/Vomiting Presumably 2/2 DKA.  Symptoms resolved - Will cancel EGD given complete resolution of symptoms - To call if symptoms recur  3) Abnormal imaging study Admission in May with DKA and nausea/vomiting.  CT with distal esophageal wall thickening which correlates well with likely esophagitis from her nausea/vomiting.  Symptoms have since resolved.  Discussed with patient today and she would like to cancel upper endoscopy.  Feel this is a reasonable decision.      The indications, risks, and benefits of flexible sigmoidoscopy were explained to the patient in detail. Risks include but are not limited to bleeding, perforation, adverse reaction to medications, and cardiopulmonary compromise. Sequelae include but are not limited to the possibility of surgery, hospitalization, and mortality. The patient verbalized understanding and wished to proceed. All questions answered.        Verlin Dike Strider Vallance ,DO, FACG 09/14/2022, 10:22 AM

## 2022-09-14 NOTE — Patient Instructions (Addendum)
You have been scheduled for a Flex Sigmoidoscopy. Please follow written instructions given to you at your visit today.  Please pick up your prep supplies at the pharmacy within the next 1-3 days. If you use inhalers (even only as needed), please bring them with you on the day of your procedure.   _______________________________________________________  If your blood pressure at your visit was 140/90 or greater, please contact your primary care physician to follow up on this.  _______________________________________________________  If you are age 65 or older, your body mass index should be between 23-30. Your Body mass index is 19.3 kg/m. If this is out of the aforementioned range listed, please consider follow up with your Primary Care Provider.  If you are age 5 or younger, your body mass index should be between 19-25. Your Body mass index is 19.3 kg/m. If this is out of the aformentioned range listed, please consider follow up with your Primary Care Provider.   __________________________________________________________  The [facility] providers would like to encourage you to use [facility] to communicate with providers for non-urgent requests or questions.  Due to long hold times on the telephone, sending your provider a message by [facility] may be a faster and more efficient way to get a response.  Please allow 48 business hours for a response.  Please remember that this is for non-urgent requests.   Due to recent changes in healthcare laws, you may see the results of your imaging and laboratory studies on MyChart before your provider has had a chance to review them.  We understand that in some cases there may be results that are confusing or concerning to you. Not all laboratory results come back in the same time frame and the provider may be waiting for multiple results in order to interpret others.  Please give [place] 48 hours in order for your provider to thoroughly review all the results before  contacting the office for clarification of your results.   Thank you for choosing me and [facility].  Vito Cirigliano, D.O.

## 2022-09-30 ENCOUNTER — Encounter: Payer: Self-pay | Admitting: Student

## 2022-09-30 ENCOUNTER — Other Ambulatory Visit: Payer: Self-pay

## 2022-09-30 ENCOUNTER — Ambulatory Visit (INDEPENDENT_AMBULATORY_CARE_PROVIDER_SITE_OTHER): Payer: 59 | Admitting: Student

## 2022-09-30 VITALS — BP 116/74 | HR 83 | Temp 98.0°F | Ht 65.0 in | Wt 120.9 lb

## 2022-09-30 DIAGNOSIS — Z124 Encounter for screening for malignant neoplasm of cervix: Secondary | ICD-10-CM

## 2022-09-30 DIAGNOSIS — E785 Hyperlipidemia, unspecified: Secondary | ICD-10-CM

## 2022-09-30 DIAGNOSIS — E1169 Type 2 diabetes mellitus with other specified complication: Secondary | ICD-10-CM

## 2022-09-30 DIAGNOSIS — I959 Hypotension, unspecified: Secondary | ICD-10-CM

## 2022-09-30 DIAGNOSIS — F32A Depression, unspecified: Secondary | ICD-10-CM

## 2022-09-30 DIAGNOSIS — Z794 Long term (current) use of insulin: Secondary | ICD-10-CM

## 2022-09-30 DIAGNOSIS — Z7984 Long term (current) use of oral hypoglycemic drugs: Secondary | ICD-10-CM

## 2022-09-30 MED ORDER — PEN NEEDLES 32G X 4 MM MISC: 1.0000 | Freq: Every day | 3 refills | Status: DC

## 2022-09-30 MED ORDER — MICROLET LANCETS MISC: 1.0000 | Freq: Every day | 3 refills | Status: DC

## 2022-09-30 MED ORDER — CONTOUR NEXT TEST VI STRP: ORAL_STRIP | 3 refills | Status: DC

## 2022-09-30 MED ORDER — INSULIN GLARGINE 100 UNIT/ML SOLOSTAR PEN
8.0000 [IU] | PEN_INJECTOR | Freq: Every day | SUBCUTANEOUS | 1 refills | Status: DC
Start: 2022-09-30 — End: 2023-06-23

## 2022-09-30 NOTE — Assessment & Plan Note (Signed)
States mood has improved with taking Prozac 10 mg daily.  Tolerating medication well and reports good adherence.  PHQ-9 of 9 today.    Plan -Continue Prozac

## 2022-09-30 NOTE — Patient Instructions (Addendum)
Thank you, Joann Campos for allowing [place] to provide your care today. Today we discussed your diabetes.  -Decrease Lantus to 8 units daily.  -Continue metformin.  -Will recheck A1c in 1 month to see how your diabetes is doing.  -Continue monitor blood sugar at home. Call us if you notice frequent low blood sugar.  -Blood pressure stable today and glad you are not having further symptoms.  -Let's plan for pap smear at your next visit.      have ordered the following medication/changed the following medications:   Stop the following medications: Medications Discontinued During This Encounter  Medication Reason   insulin glargine (LANTUS) 100 UNIT/ML Solostar Pen    Microlet Lancets MISC Reorder   glucose blood (CONTOUR NEXT TEST) test strip Reorder   Insulin Pen Needle (PEN NEEDLES) 32G X 4 MM MISC Reorder     Start the following medications: Meds ordered this encounter  Medications   Insulin Pen Needle (PEN NEEDLES) 32G X 4 MM MISC    Sig: 1 each by Does not apply route daily.    Dispense:  100 each    Refill:  3   glucose blood (CONTOUR NEXT TEST) test strip    Sig: Use as instructed    Dispense:  100 each    Refill:  3   insulin glargine (LANTUS) 100 UNIT/ML Solostar Pen    Sig: Inject 8 Units into the skin at bedtime.    Dispense:  15 mL    Refill:  1   Microlet Lancets MISC    Sig: 1 each by Does not apply route daily.    Dispense:  100 each    Refill:  3     Follow up:  1 month    Should you have any questions or concerns please call the internal medicine clinic at [phone].    Rana Snare, D.O. [facility]

## 2022-09-30 NOTE — Progress Notes (Signed)
   CC: Diabetes follow-up  HPI:  Joann Campos is a 67 y.o. female living with a history stated below and presents today for diabetes follow up. Please see problem based assessment and plan for additional details.   Past Medical History:  Diagnosis Date   Diabetes (HCC) 09/2021   Hip pain 2021   Hyperlipidemia     Current Outpatient Medications on File Prior to Visit  Medication Sig Dispense Refill   Cyanocobalamin (VITAMIN B 12 PO) Take 1 tablet by mouth daily. One tablet     FLUoxetine (PROZAC) 10 MG capsule Take 1 capsule (10 mg total) by mouth daily. 90 capsule 1   gabapentin (NEURONTIN) 300 MG capsule Take 2 capsules (600 mg total) by mouth at bedtime. 90 capsule 2   metFORMIN (GLUCOPHAGE-XR) 500 MG 24 hr tablet Take 2 tablets (1,000 mg total) by mouth 2 (two) times daily with a meal. 360 tablet 3   Multiple Vitamins-Minerals (MULTIVITAMIN ADULTS PO) Take 1 tablet by mouth daily. Women over 50     prochlorperazine (COMPAZINE) 10 MG tablet Take 1 tablet (10 mg total) by mouth every 6 (six) hours as needed for nausea or vomiting. 30 tablet 0   rosuvastatin (CRESTOR) 20 MG tablet Take 1 tablet (20 mg total) by mouth daily. 90 tablet 3   No current facility-administered medications on file prior to visit.   Review of Systems: ROS negative except for what is noted on the assessment and plan.  Vitals:   09/30/22 0853  BP: 116/74  Pulse: 83  Temp: 98 F (36.7 C)  TempSrc: Oral  SpO2: 100%  Weight: 120 lb 14.4 oz (54.8 kg)  Height: 5\' 5"  (1.651 m)   Physical Exam: Constitutional: well-appearing female sitting in chair comfortably, in no acute distress Cardiovascular: regular rate, palpable DP and TP pulses bilaterally Pulmonary/Chest: normal work of breathing on room air Neurological: alert & oriented x 3 Skin: No open cuts or wounds on bilateral feet, warm and dry Psych: pleasant mood  Assessment & Plan:   Type 2 diabetes mellitus with hyperlipidemia  (HCC) Presents for follow-up.  Last A1c was 7.6 in May.  SGLT2 was stopped due to recent DKA while on it.  She continues to take metformin 1000 mg BID.  There was suspicion for LADA versus MODY but negative antibodies for type I.  Lantus 10 units nightly was started at last OV. Provided glucose log today, average 90-100 but noted 1 episode of hypoglycemia 67 and other 2 readings in mid 70.  Was symptomatic with the 67, treated and resolved. Will decrease Lantus today.   If further suspicion for MODY would need to do genetic testing but will defer at this time.  Foot exam completed today.  Plan -Decrease Lantus to 8 units nightly -Continue metformin -Continue daily glucose monitoring -Supplies refilled/sent to [facility] home delivery -Follow-up in 1 month with repeat A1c  Screening for cervical cancer Patient declined Pap smear today, stated time constraint. She is willing to complete it at f/u visit in 1 month.  States had prior Pap smear but unclear when.  Hypotension Resolved. BP stable today at 116/74. Asymptomatic and patient denies any recent concerns.   Depression States mood has improved with taking Prozac 10 mg daily.  Tolerating medication well and reports good adherence.  PHQ-9 of 9 today.    Plan -Continue Prozac   Patient discussed with Dr. Marin Roberts, D.O. [facility] Internal Medicine, PGY-2 Phone: [phone] Date 09/30/2022 Time 9:59 AM

## 2022-09-30 NOTE — Assessment & Plan Note (Signed)
Patient declined Pap smear today, stated time constraint. She is willing to complete it at f/u visit in 1 month.  States had prior Pap smear but unclear when.

## 2022-09-30 NOTE — Assessment & Plan Note (Addendum)
Presents for follow-up.  Last A1c was 7.6 in May.  SGLT2 was stopped due to recent DKA while on it.  She continues to take metformin 1000 mg BID.  There was suspicion for LADA versus MODY but negative antibodies for type I.  Lantus 10 units nightly was started at last OV. Provided glucose log today, average 90-100 but noted 1 episode of hypoglycemia 67 and other 2 readings in mid 70.  Was symptomatic with the 67, treated and resolved. Will decrease Lantus today.   If further suspicion for MODY would need to do genetic testing but will defer at this time.  Foot exam completed today.  Plan -Decrease Lantus to 8 units nightly -Continue metformin -Continue daily glucose monitoring -Supplies refilled/sent to [facility] home delivery -Follow-up in 1 month with repeat A1c

## 2022-09-30 NOTE — Assessment & Plan Note (Signed)
Resolved. BP stable today at 116/74. Asymptomatic and patient denies any recent concerns.

## 2022-10-05 NOTE — Progress Notes (Signed)
 Internal Medicine Clinic Attending  Case discussed with the resident at the time of the visit.  We reviewed the resident's history and exam and pertinent patient test results.  I agree with the assessment, diagnosis, and plan of care documented in the resident's note.  

## 2022-10-05 NOTE — Addendum Note (Signed)
Addended by: Dickie La on: 10/05/2022 03:39 PM   Modules accepted: Level of Service

## 2022-10-13 ENCOUNTER — Other Ambulatory Visit: Payer: Self-pay

## 2022-10-13 ENCOUNTER — Encounter (HOSPITAL_COMMUNITY): Payer: Self-pay | Admitting: Gastroenterology

## 2022-10-13 ENCOUNTER — Ambulatory Visit (HOSPITAL_COMMUNITY)
Admission: RE | Admit: 2022-10-13 | Discharge: 2022-10-13 | Disposition: A | Discharge status: Home / Self Care | Payer: 59 | Attending: Gastroenterology | Admitting: Gastroenterology

## 2022-10-13 ENCOUNTER — Encounter (HOSPITAL_COMMUNITY)
Admission: RE | Disposition: A | Discharge status: Home / Self Care | Payer: Self-pay | Source: Home / Self Care | Attending: Gastroenterology

## 2022-10-13 ENCOUNTER — Ambulatory Visit (HOSPITAL_COMMUNITY): Payer: 59

## 2022-10-13 ENCOUNTER — Ambulatory Visit (HOSPITAL_BASED_OUTPATIENT_CLINIC_OR_DEPARTMENT_OTHER): Payer: 59

## 2022-10-13 DIAGNOSIS — D128 Benign neoplasm of rectum: Secondary | ICD-10-CM | POA: Diagnosis not present

## 2022-10-13 DIAGNOSIS — E785 Hyperlipidemia, unspecified: Secondary | ICD-10-CM | POA: Insufficient documentation

## 2022-10-13 DIAGNOSIS — K644 Residual hemorrhoidal skin tags: Secondary | ICD-10-CM | POA: Diagnosis not present

## 2022-10-13 DIAGNOSIS — Z860100 Personal history of colon polyps, unspecified: Secondary | ICD-10-CM

## 2022-10-13 DIAGNOSIS — K641 Second degree hemorrhoids: Secondary | ICD-10-CM | POA: Diagnosis not present

## 2022-10-13 DIAGNOSIS — Z09 Encounter for follow-up examination after completed treatment for conditions other than malignant neoplasm: Secondary | ICD-10-CM | POA: Diagnosis present

## 2022-10-13 DIAGNOSIS — K649 Unspecified hemorrhoids: Secondary | ICD-10-CM

## 2022-10-13 DIAGNOSIS — E1142 Type 2 diabetes mellitus with diabetic polyneuropathy: Secondary | ICD-10-CM | POA: Insufficient documentation

## 2022-10-13 DIAGNOSIS — F32A Depression, unspecified: Secondary | ICD-10-CM | POA: Diagnosis not present

## 2022-10-13 DIAGNOSIS — K6289 Other specified diseases of anus and rectum: Secondary | ICD-10-CM

## 2022-10-13 HISTORY — PX: FLEXIBLE SIGMOIDOSCOPY: SHX5431

## 2022-10-13 LAB — GLUCOSE, CAPILLARY
Glucose-Capillary: 94 mg/dL (ref 70–99)
Glucose-Capillary: 77 mg/dL (ref 70–99)

## 2022-10-13 SURGERY — SIGMOIDOSCOPY, FLEXIBLE
Anesthesia: Monitor Anesthesia Care

## 2022-10-13 MED ORDER — PROPOFOL 500 MG/50ML IV EMUL
INTRAVENOUS | Status: DC | PRN
  Administered 2022-10-13: 125 ug/kg/min via INTRAVENOUS

## 2022-10-13 MED ORDER — OXYCODONE HCL 5 MG PO TABS: 5.0000 mg | ORAL_TABLET | Freq: Once | ORAL | Status: DC | PRN

## 2022-10-13 MED ORDER — PHENYLEPHRINE 80 MCG/ML (10ML) SYRINGE FOR IV PUSH (FOR BLOOD PRESSURE SUPPORT)
PREFILLED_SYRINGE | INTRAVENOUS | Status: DC | PRN
  Administered 2022-10-13: 80 ug via INTRAVENOUS

## 2022-10-13 MED ORDER — SODIUM CHLORIDE 0.9 % IV SOLN: INTRAVENOUS | Status: DC

## 2022-10-13 MED ORDER — LACTATED RINGERS IV SOLN: INTRAVENOUS | Status: DC | PRN

## 2022-10-13 MED ORDER — PROPOFOL 10 MG/ML IV BOLUS
INTRAVENOUS | Status: DC | PRN
  Administered 2022-10-13: 50 mg via INTRAVENOUS
  Administered 2022-10-13: 10 mg via INTRAVENOUS

## 2022-10-13 MED ORDER — FENTANYL CITRATE (PF) 100 MCG/2ML IJ SOLN: 25.0000 ug | INTRAMUSCULAR | Status: DC | PRN

## 2022-10-13 MED ORDER — OXYCODONE HCL 5 MG/5ML PO SOLN: 5.0000 mg | Freq: Once | ORAL | Status: DC | PRN

## 2022-10-13 MED ORDER — ONDANSETRON HCL 4 MG/2ML IJ SOLN: 4.0000 mg | Freq: Four times a day (QID) | INTRAMUSCULAR | Status: DC | PRN

## 2022-10-13 NOTE — Op Note (Signed)
[facility] Patient Name: Joann Campos Procedure Date : 10/13/2022 MRN: [id number] Attending MD: Doristine Locks , MD, [id number] Date of Birth: [birth year] CSN: [id number] Age: 67 Admit Type: Inpatient Procedure:                Flexible Sigmoidoscopy Indications:              High risk colon cancer surveillance: Personal                            history of colonic polyps                           Endoscopic History:                           ?" 01/04/2022: Colonoscopy: 2 subcentimeter adenomas                            removed with cold snare, internal hemorrhoids, and                            a large 40 mm rectal polyp with thick stalk located                            10- 12 cm from the anal verge (biopsied: TVA).                            Medium size internal hemorrhoids                           ?" 03/01/2022: Colonoscopy: 50 mm polyp with thick                            stalk resected using a combination of epinephrine,                            Endoloop, and hot snare, then resection of edges                            cold forceps via avulsion technique (path: TVA with                            foci of high-grade dysplasia, negative for                            carcinoma. Identified margins are negative for                            adenoma). Recommended repeat in 6 months via                            flexible sigmoidoscopy due to piecemeal resection. Providers:                Doristine Locks, MD, Geralyn Corwin, RN, Ethelene Browns  Cheryll Dessert, Technician Referring MD:              Medicines:                Monitored Anesthesia Care Complications:            No immediate complications. Estimated Blood Loss:     Estimated blood loss: none. Procedure:                Pre-Anesthesia Assessment:                           - Prior to the procedure, a History and Physical                            was performed, and patient  medications and                            allergies were reviewed. The patient's tolerance of                            previous anesthesia was also reviewed. The risks                            and benefits of the procedure and the sedation                            options and risks were discussed with the patient.                            All questions were answered, and informed consent                            was obtained. Prior Anticoagulants: The patient has                            taken no anticoagulant or antiplatelet agents. ASA                            Grade Assessment: II - A patient with mild systemic                            disease. After reviewing the risks and benefits,                            the patient was deemed in satisfactory condition to                            undergo the procedure.                           After obtaining informed consent, the scope was                            passed under direct vision. The PCF-HQ190L                            (  [id number]) Olympus colonoscope was introduced                            through the anus and advanced to the the sigmoid                            colon. The flexible sigmoidoscopy was technically                            difficult and complex due to poor bowel prep. The                            quality of the bowel preparation was poor. Scope In: Scope Out: Findings:      Hemorrhoids were found on perianal exam.      A large amount of stool was found in the rectum, in the recto-sigmoid       colon, and in the distal sigmoid colon, interfering with visualization.       Lavage of the area was performed using copious amounts of tap water,       resulting in clearance with fair visualization. Made several passes       through the area of the previous polypectomy for both inspection and       stool removal/relocation. While there were no obvious polyps or       recurrent polypoid tissue, the  colonic mucosa was not completely       visualized due to the adherent and solid stool. Despite copious       irrigation, could not readily identify the previous polypectomy site.      Non-bleeding internal hemorrhoids were found during retroflexion. The       hemorrhoids were Grade II (internal hemorrhoids that prolapse but reduce       spontaneously). Impression:               - Preparation of the colon was poor.                           - Hemorrhoids found on perianal exam.                           - Stool in the rectum, in the recto-sigmoid colon                            and in the distal sigmoid colon.                           - Non-bleeding internal hemorrhoids.                           - No specimens collected. Recommendation:           - Discharge patient to home (with escort).                           - Advance diet as tolerated today.                           -  Continue present medications.                           - Recommend repeat colonoscopy in 6-12 months. This                            can be done in the outpatient endoscopy center. Procedure Code(s):        --- Professional ---                           [phone], Sigmoidoscopy, flexible; diagnostic,                            including collection of specimen(s) by brushing or                            washing, when performed (separate procedure) Diagnosis Code(s):        --- Professional ---                           Z86.010, Personal history of colonic polyps                           K64.1, Second degree hemorrhoids CPT copyright 2022 [employer]. All rights reserved. The codes documented in this report are preliminary and upon coder review may  be revised to meet current compliance requirements. Doristine Locks, MD 10/13/2022 11:53:10 AM Number of Addenda: 0

## 2022-10-13 NOTE — Interval H&P Note (Signed)
History and Physical Interval Note:  10/13/2022 10:12 AM  Joann Campos  has presented today for surgery, with the diagnosis of s/p rectal polypectomy 02/2022 - surveillance.  The various methods of treatment have been discussed with the patient and family. After consideration of risks, benefits and other options for treatment, the patient has consented to  Procedure(s): FLEXIBLE SIGMOIDOSCOPY (N/A) as a surgical intervention.  The patient's history has been reviewed, patient examined, no change in status, stable for surgery.  I have reviewed the patient's chart and labs.  Questions were answered to the patient's satisfaction.     Verlin Dike Nayely Dingus

## 2022-10-13 NOTE — Anesthesia Preprocedure Evaluation (Signed)
Anesthesia Evaluation  Patient identified by MRN, date of birth, ID band Patient awake    Reviewed: Allergy & Precautions, H&P , NPO status , Patient's Chart, lab work & pertinent test results  Airway Mallampati: II   Neck ROM: full    Dental   Pulmonary former smoker   breath sounds clear to auscultation       Cardiovascular negative cardio ROS  Rhythm:regular Rate:Normal     Neuro/Psych  PSYCHIATRIC DISORDERS  Depression       GI/Hepatic   Endo/Other  diabetes, Type 2    Renal/GU      Musculoskeletal   Abdominal   Peds  Hematology   Anesthesia Other Findings   Reproductive/Obstetrics                             Anesthesia Physical Anesthesia Plan  ASA: 2  Anesthesia Plan: MAC   Post-op Pain Management:    Induction: Intravenous  PONV Risk Score and Plan: 2 and Propofol infusion and Treatment may vary due to age or medical condition  Airway Management Planned: Nasal Cannula  Additional Equipment:   Intra-op Plan:   Post-operative Plan:   Informed Consent: I have reviewed the patients History and Physical, chart, labs and discussed the procedure including the risks, benefits and alternatives for the proposed anesthesia with the patient or authorized representative who has indicated his/her understanding and acceptance.     Dental advisory given  Plan Discussed with: CRNA, Anesthesiologist and Surgeon  Anesthesia Plan Comments:        Anesthesia Quick Evaluation

## 2022-10-13 NOTE — Transfer of Care (Signed)
Immediate Anesthesia Transfer of Care Note  Patient: Joann Campos  Procedure(s) Performed: FLEXIBLE SIGMOIDOSCOPY  Patient Location: PACU  Anesthesia Type:MAC  Level of Consciousness: awake, alert , and oriented  Airway & Oxygen Therapy: Patient Spontanous Breathing  Post-op Assessment: Report given to RN and Post -op Vital signs reviewed and stable  Post vital signs: Reviewed and stable  Last Vitals:  Vitals Value Taken Time  BP 101/73 10/13/22 1149  Temp 36.6 C 10/13/22 1149  Pulse 70 10/13/22 1153  Resp 16 10/13/22 1153  SpO2 96 % 10/13/22 1153  Vitals shown include unfiled device data.  Last Pain:  Vitals:   10/13/22 1149  TempSrc:   PainSc: 0-No pain         Complications: No notable events documented.

## 2022-10-14 ENCOUNTER — Telehealth: Payer: Self-pay

## 2022-10-14 NOTE — Telephone Encounter (Signed)
Recall placed to have a repeat colonoscopy in 6 months.

## 2022-10-14 NOTE — Telephone Encounter (Signed)
-----   Message from Shellia Cleverly sent at 10/13/2022 12:05 PM EDT ----- Can you please coordinate for this patient to have a repeat colonoscopy in 6 months.  Ok to schedule to be done in the [facility] this time.  Thanks.

## 2022-10-14 NOTE — Anesthesia Postprocedure Evaluation (Signed)
Anesthesia Post Note  Patient: Joann Campos  Procedure(s) Performed: FLEXIBLE SIGMOIDOSCOPY     Patient location during evaluation: Endoscopy Anesthesia Type: MAC Level of consciousness: awake and alert Pain management: pain level controlled Vital Signs Assessment: post-procedure vital signs reviewed and stable Respiratory status: spontaneous breathing, nonlabored ventilation, respiratory function stable and patient connected to nasal cannula oxygen Cardiovascular status: stable and blood pressure returned to baseline Postop Assessment: no apparent nausea or vomiting Anesthetic complications: no   No notable events documented.  Last Vitals:  Vitals:   10/13/22 1149 10/13/22 1204  BP: 101/73 128/72  Pulse: 73 71  Resp: 17 13  Temp: 36.6 C 36.6 C  SpO2: 96% 99%    Last Pain:  Vitals:   10/13/22 1204  TempSrc:   PainSc: 0-No pain                 Daleiza Bacchi S

## 2022-10-17 ENCOUNTER — Encounter (HOSPITAL_COMMUNITY): Payer: Self-pay | Admitting: Gastroenterology

## 2022-10-21 ENCOUNTER — Encounter: Payer: Self-pay | Admitting: Surgical

## 2022-10-21 ENCOUNTER — Other Ambulatory Visit: Payer: Self-pay

## 2022-10-21 ENCOUNTER — Ambulatory Visit (INDEPENDENT_AMBULATORY_CARE_PROVIDER_SITE_OTHER): Payer: 59 | Admitting: Surgical

## 2022-10-21 DIAGNOSIS — G8929 Other chronic pain: Secondary | ICD-10-CM

## 2022-10-21 DIAGNOSIS — M25511 Pain in right shoulder: Secondary | ICD-10-CM

## 2022-10-21 DIAGNOSIS — M19011 Primary osteoarthritis, right shoulder: Secondary | ICD-10-CM | POA: Diagnosis not present

## 2022-10-21 MED ORDER — BUPIVACAINE HCL 0.25 % IJ SOLN
9.0000 mL | INTRAMUSCULAR | Status: AC | PRN
Start: 2022-10-21 — End: 2022-10-21
  Administered 2022-10-21: 9 mL via INTRA_ARTICULAR

## 2022-10-21 MED ORDER — METHYLPREDNISOLONE ACETATE 40 MG/ML IJ SUSP
40.0000 mg | INTRAMUSCULAR | Status: AC | PRN
Start: 2022-10-21 — End: 2022-10-21
  Administered 2022-10-21: 40 mg via INTRA_ARTICULAR

## 2022-10-21 MED ORDER — LIDOCAINE HCL 1 % IJ SOLN
5.0000 mL | INTRAMUSCULAR | Status: AC | PRN
Start: 2022-10-21 — End: 2022-10-21
  Administered 2022-10-21: 5 mL

## 2022-10-21 NOTE — Progress Notes (Signed)
   Procedure Note  Patient: Joann Campos             Date of Birth: [birth year]           MRN: [id number]             Visit Date: 10/21/2022  Procedures: Visit Diagnoses:  1. Glenohumeral arthritis, right   2. Chronic right shoulder pain     Large Joint Inj: R glenohumeral on 10/21/2022 3:55 PM Details: 22 G 3.5 in needle, ultrasound-guided posterior approach Medications: 5 mL lidocaine 1 %; 9 mL bupivacaine 0.25 %; 40 mg methylPREDNISolone acetate 40 MG/ML Outcome: tolerated well, no immediate complications  Ortho exam today demonstrates 25 degrees X rotation, 75 degrees abduction, 140 degrees forward elevation passively and actively.  Axillary nerve is intact with deltoid firing.  Excellent rotator cuff strength of supra, infra, subscap though subscap strength testing does reproduce pain.  No cellulitis or skin changes noted. Procedure, treatment alternatives, risks and benefits explained, specific risks discussed. Consent was given by the patient. Patient was prepped and draped in the usual sterile fashion.

## 2022-11-07 ENCOUNTER — Ambulatory Visit: Payer: 59 | Admitting: Registered"

## 2022-11-13 ENCOUNTER — Other Ambulatory Visit: Payer: Self-pay | Admitting: Student

## 2022-11-13 DIAGNOSIS — G6289 Other specified polyneuropathies: Secondary | ICD-10-CM

## 2022-11-28 ENCOUNTER — Ambulatory Visit: Payer: 59 | Admitting: Dietician

## 2022-12-01 IMAGING — XA DG FLUORO GUIDE NDL PLC/BX
1 series · 1 of 1 positions shown · IV contrast (multihance)
Comparison: none

CLINICAL DATA: Rotator cuff tear

EXAM:
RIGHT SHOULDER INJECTION UNDER FLUOROSCOPY
TECHNIQUE: An appropriate skin entrance site was determined. The site was
marked, prepped with Betadine, draped in the usual sterile fashion,
and infiltrated locally with buffered Lidocaine. 20 gauge spinal
needle was advanced to the superomedial margin of the humeral head
under intermittent fluoroscopy. 1 ml of Lidocaine injected easily. A
mixture of 0.1 ml Multihance mixed with diluted iodinated contrast
material was then used to opacify the right shoulder capsule. No
immediate complication.
FLUOROSCOPY:
Radiation Exposure Index (as provided by the fluoroscopic device):
0.4 minutes (1 mGy)

[Series 1: vasc standard · 1 of 1 slices shown]
[im 1/1]
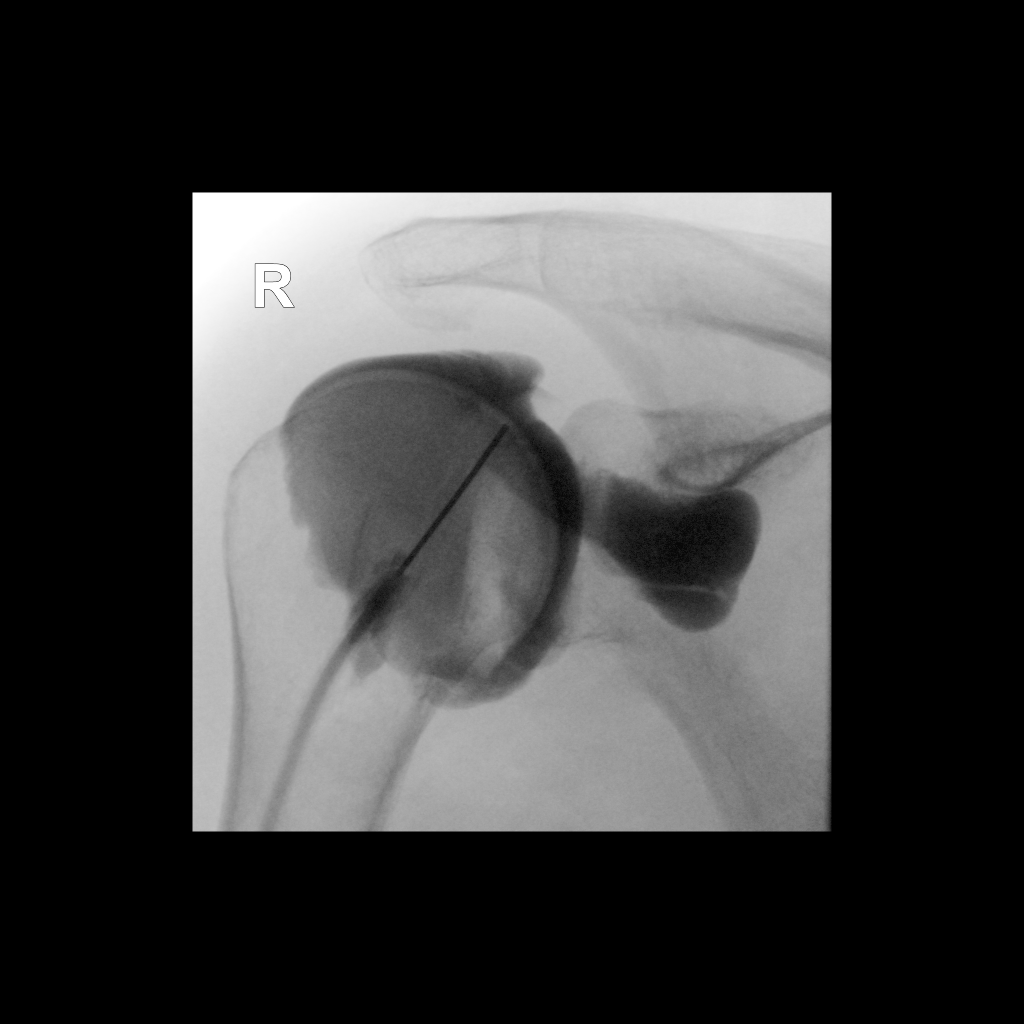

[1 of 1 positions shown; findings below may reference images not displayed]

FINDINGS: See above.
IMPRESSION: Technically successful right shoulder injection for MRI arthrogram.

## 2022-12-01 IMAGING — MR MR SHOULDER*R* W/CM
5 series · 40 of 40 positions shown · IV contrast (agent unspecified)
Comparison: Right shoulder radiographs 12/04/2020

CLINICAL DATA: Evaluate for rotator cuff tear. Right shoulder pain
for 1 year.

EXAM:
MRI OF THE RIGHT SHOULDER WITH CONTRAST
TECHNIQUE: Multiplanar, multisequence MR imaging of the right shoulder was
performed following the administration of intra-articular contrast.
CONTRAST:  See Injection Documentation.

[Series 3: T1 fat-sat · axial · 4.0mm · 0.29mm/px · z∈[-24,+75]mm · 8 of 21 slices shown (1 of 3)]
[im 1/21]
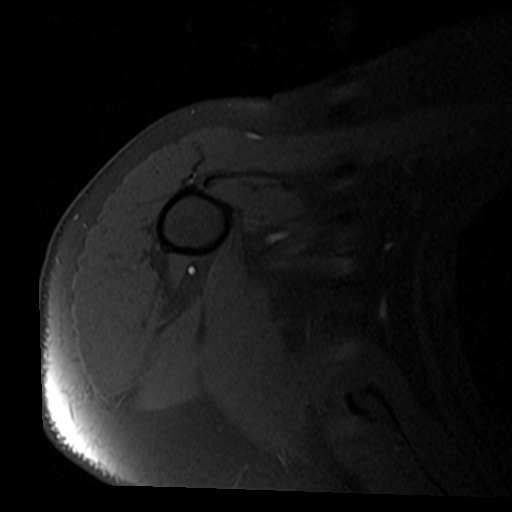
[im 3/21]
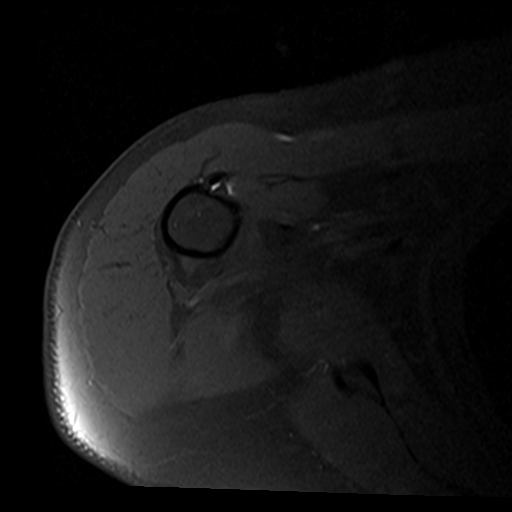
[im 6/21]
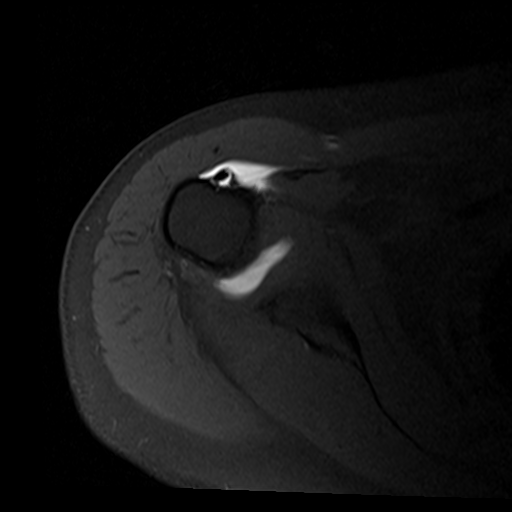
[im 9/21]
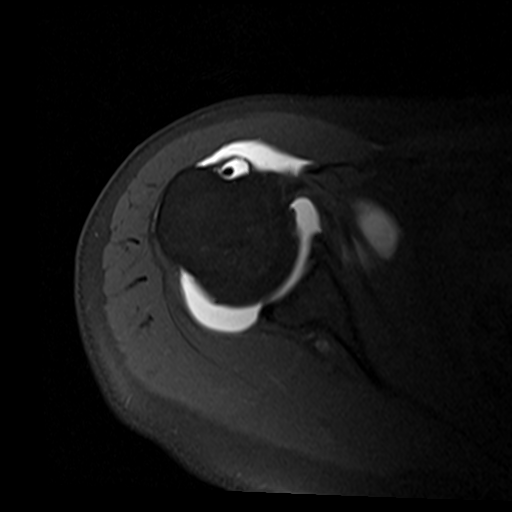
[im 12/21]
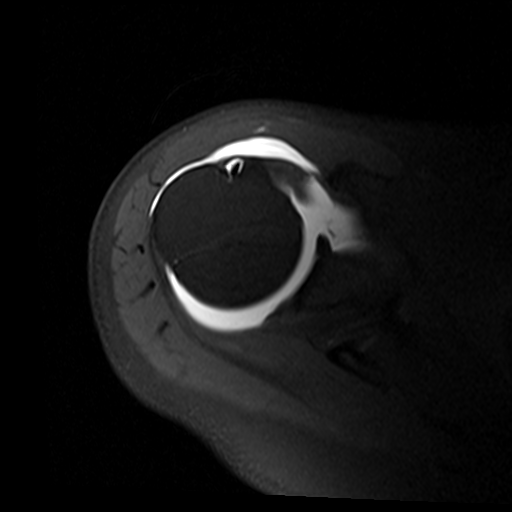
[im 15/21]
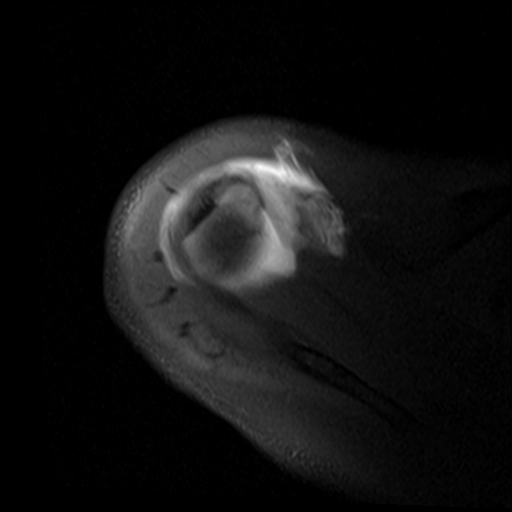
[im 18/21]
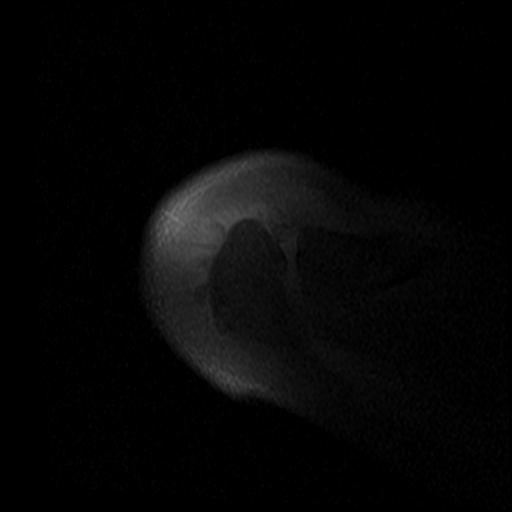
[im 21/21]
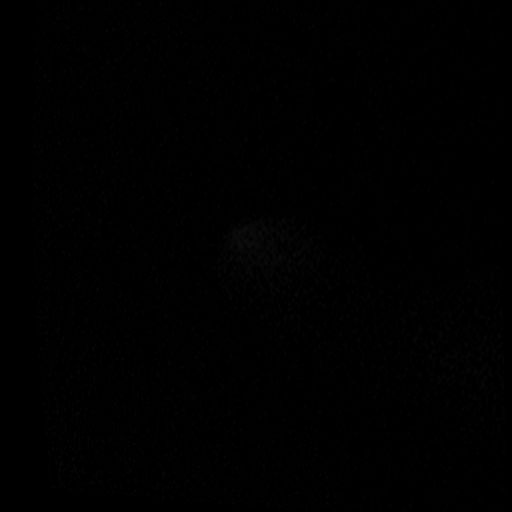

[Series 4: T2 fat-sat · oblique · 4.0mm · 0.59mm/px · 8 of 20 slices shown (1 of 2)]
[im 1/20]
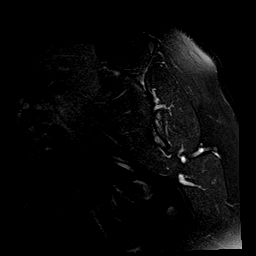
[im 3/20]
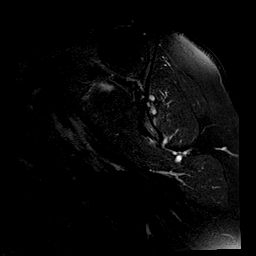
[im 6/20]
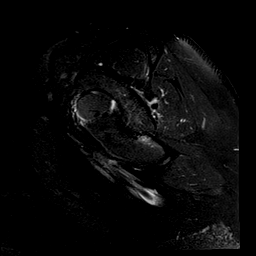
[im 9/20]
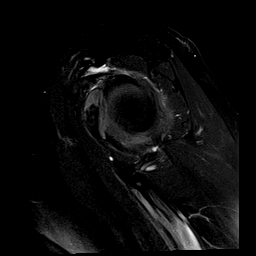
[im 11/20]
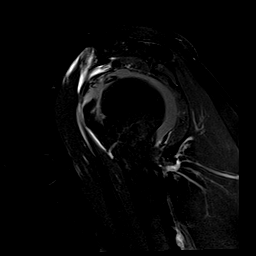
[im 14/20]
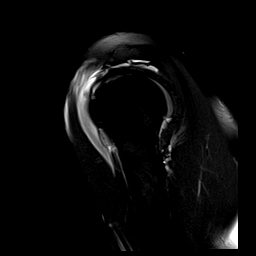
[im 17/20]
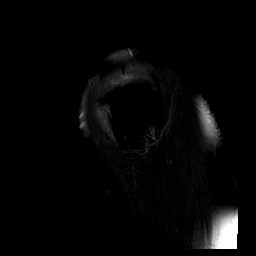
[im 20/20]
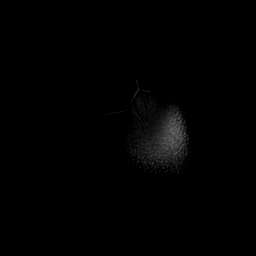

[Series 5: T1 fat-sat · oblique · 4.0mm · 0.59mm/px · 8 of 18 slices shown (2 of 3)]
[im 1/18]
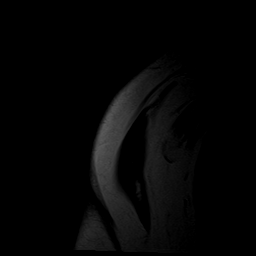
[im 3/18]
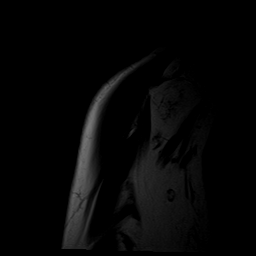
[im 5/18]
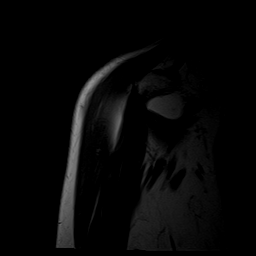
[im 8/18]
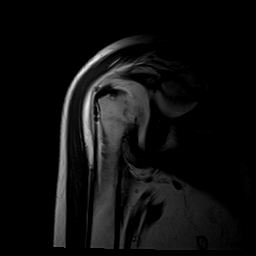
[im 10/18]
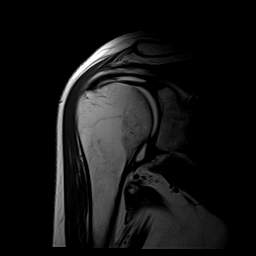
[im 13/18]
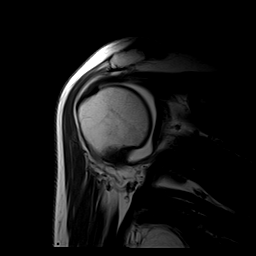
[im 15/18]
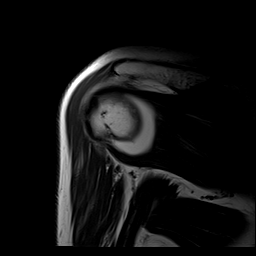
[im 18/18]
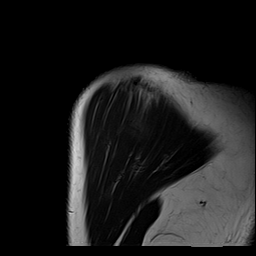

[Series 6: T1 fat-sat · oblique · 4.0mm · 0.59mm/px · 8 of 18 slices shown (3 of 3)]
[im 1/18]
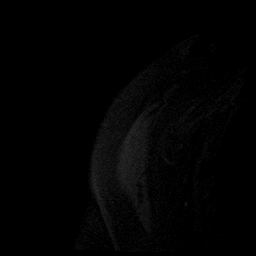
[im 3/18]
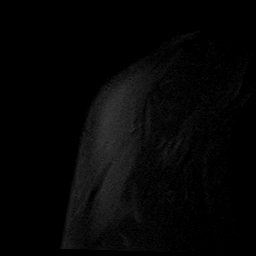
[im 5/18]
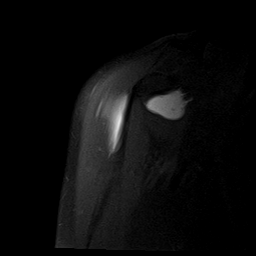
[im 8/18]
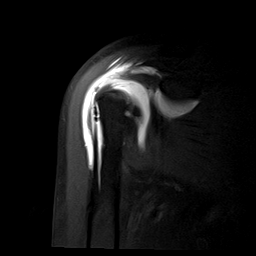
[im 10/18]
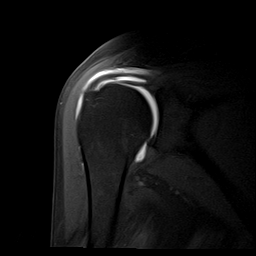
[im 13/18]
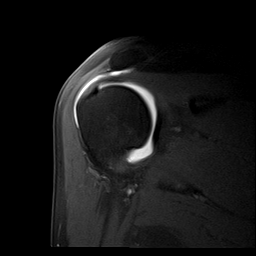
[im 15/18]
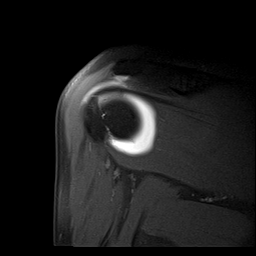
[im 18/18]
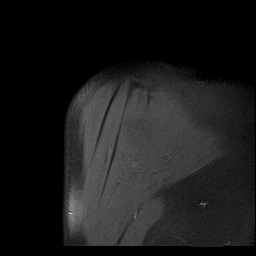

[Series 7: T2 fat-sat · oblique · 4.0mm · 0.59mm/px · 8 of 18 slices shown (2 of 2)]
[im 1/18]
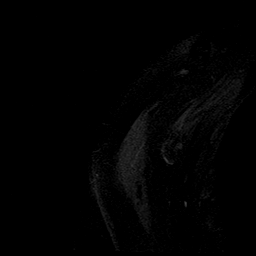
[im 3/18]
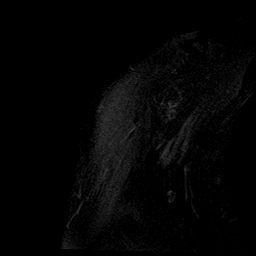
[im 5/18]
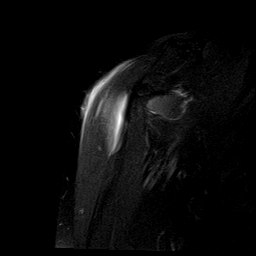
[im 8/18]
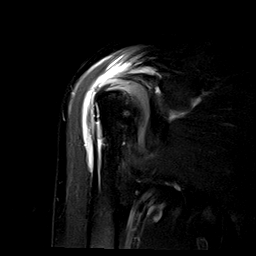
[im 10/18]
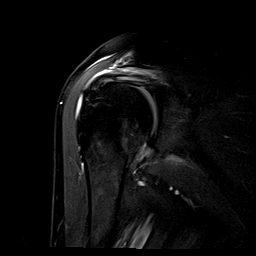
[im 13/18]
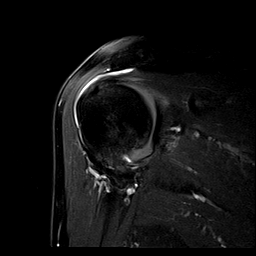
[im 15/18]
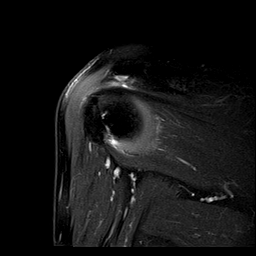
[im 18/18]
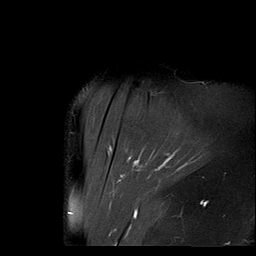

[40 of 40 positions shown; findings below may reference images not displayed]

FINDINGS: Rotator cuff: There is mild intermediate T2 signal tendinosis of the
articular side of the supraspinatus tendon diffusely (coronal series
7 images 6 through 9). The infraspinatus is intact. The
subscapularis and teres minor are intact.

Muscles: No rotator cuff muscle atrophy, fatty infiltration, or
edema.

Biceps long head: Mild intermediate T2 signal proximal long head of
the biceps tendinosis with non fluid-bright but linear possible
interstitial tear (coronal image 9 and sagittal image 10).

Acromioclavicular Joint: Normal alignment of the acromioclavicular
joint with only minimal joint space narrowing and peripheral
osteophytosis. Mild distal lateral broad-based subacromial spurring.
Mild lateral downsloping of the acromion on coronal images. Type 2
acromion.

There is mild gadolinium contrast seen throughout the
subacromial/subdeltoid bursa. This is not appreciated on the initial
fluoroscopic image after arthrogram injection prior to this MRI. No
definitive full-thickness rotator cuff tear is seen. This gadolinium
contrast may have entered the subacromial/subdeltoid bursa
iatrogenically during needle placement. Alternatively, there may be
a thin full-thickness fenestration within a portion of the
presumably superior subscapularis or anterior supraspinatus tendon
insertion, however this is not definitely visualized.

Glenohumeral Joint: Moderate thinning of the far inferior medial
aspect of the humeral head cartilage.

Labrum: Grossly intact, but evaluation is limited by lack of
intraarticular fluid.

Bones: Minimal inferior humeral head-neck junction degenerative
osteophytosis.

Other: There is a decreased T1 and decreased T2 signal focus
measuring up to 3 mm within the non dependent anteromedial aspect of
the biceps tendon sheath (axial image 12 and coronal image 12). This
appears to demonstrate some blooming artifact may represent a small
amount of a congenitally injected air. This also could represent a
small loose body.
IMPRESSION: 1. Mild articular sided tendinosis of the majority of the
supraspinatus tendon insertion.
2. No definite full-thickness rotator cuff tear is seen, although
there is mild gadolinium contrast within the subacromial/subdeltoid
bursa. This gadolinium contrast may have entered the
subacromial/subdeltoid bursa iatrogenically during needle placement.
Alternatively, there may be a thin full-thickness fenestration
within a portion of the presumably superior subscapularis or
anterior supraspinatus tendon insertion, however this is not
definitely visualized.
3. Mild glenohumeral cartilage degenerative changes.
4. No labral tear is seen.
5. Proximal long head of the biceps linear interstitial tear. No
retraction.
6. Possible small focus of iatrogenically introduced air within the
biceps tendon sheath. This also could represent a small loose body.

## 2022-12-06 ENCOUNTER — Other Ambulatory Visit: Payer: Self-pay | Admitting: Student

## 2022-12-06 DIAGNOSIS — F32A Depression, unspecified: Secondary | ICD-10-CM

## 2022-12-06 DIAGNOSIS — E782 Mixed hyperlipidemia: Secondary | ICD-10-CM

## 2023-02-27 ENCOUNTER — Ambulatory Visit (INDEPENDENT_AMBULATORY_CARE_PROVIDER_SITE_OTHER): Payer: 59 | Admitting: Surgical

## 2023-02-27 ENCOUNTER — Encounter: Payer: Self-pay | Admitting: Surgical

## 2023-02-27 ENCOUNTER — Other Ambulatory Visit: Payer: Self-pay

## 2023-02-27 DIAGNOSIS — M19011 Primary osteoarthritis, right shoulder: Secondary | ICD-10-CM

## 2023-02-27 DIAGNOSIS — G8929 Other chronic pain: Secondary | ICD-10-CM

## 2023-02-27 DIAGNOSIS — M25511 Pain in right shoulder: Secondary | ICD-10-CM

## 2023-03-05 ENCOUNTER — Encounter: Payer: Self-pay | Admitting: Surgical

## 2023-03-05 MED ORDER — BUPIVACAINE HCL 0.25 % IJ SOLN
9.0000 mL | INTRAMUSCULAR | Status: AC | PRN
Start: 2023-02-27 — End: 2023-02-27
  Administered 2023-02-27: 9 mL via INTRA_ARTICULAR

## 2023-03-05 MED ORDER — METHYLPREDNISOLONE ACETATE 40 MG/ML IJ SUSP
40.0000 mg | INTRAMUSCULAR | Status: AC | PRN
Start: 2023-02-27 — End: 2023-02-27
  Administered 2023-02-27: 40 mg via INTRA_ARTICULAR

## 2023-03-05 MED ORDER — LIDOCAINE HCL 1 % IJ SOLN
5.0000 mL | INTRAMUSCULAR | Status: AC | PRN
Start: 2023-02-27 — End: 2023-02-27
  Administered 2023-02-27: 5 mL

## 2023-03-05 NOTE — Progress Notes (Signed)
   Procedure Note  Patient: Joann Campos             Date of Birth: 10-Apr-1955           MRN: [id number]             Visit Date: 02/27/2023  Procedures: Visit Diagnoses:  1. Chronic right shoulder pain     Large Joint Inj: R glenohumeral on 02/27/2023 11:59 AM Details: 22 G 3.5 in needle, ultrasound-guided posterior approach Medications: 5 mL lidocaine 1 %; 9 mL bupivacaine 0.25 %; 40 mg methylPREDNISolone acetate 40 MG/ML Outcome: tolerated well, no immediate complications Procedure, treatment alternatives, risks and benefits explained, specific risks discussed. Consent was given by the patient. Patient was prepped and draped in the usual sterile fashion.

## 2023-04-16 ENCOUNTER — Ambulatory Visit (HOSPITAL_COMMUNITY)
Admission: EM | Admit: 2023-04-16 | Discharge: 2023-04-16 | Disposition: A | Discharge status: Home / Self Care | Payer: 59 | Attending: Emergency Medicine | Admitting: Emergency Medicine

## 2023-04-16 ENCOUNTER — Encounter (HOSPITAL_COMMUNITY): Payer: Self-pay

## 2023-04-16 DIAGNOSIS — R300 Dysuria: Secondary | ICD-10-CM

## 2023-04-16 LAB — POCT URINALYSIS DIP (MANUAL ENTRY)
Bilirubin, UA: NEGATIVE
Glucose, UA: NEGATIVE mg/dL
Ketones, POC UA: NEGATIVE mg/dL
Leukocytes, UA: NEGATIVE
Nitrite, UA: NEGATIVE
Protein Ur, POC: NEGATIVE mg/dL
Spec Grav, UA: <=1.005 — AB (ref 1.010–1.025)
Urobilinogen, UA: 0.2 U/dL
pH, UA: 5.5 (ref 5.0–8.0)

## 2023-04-16 MED ORDER — PHENAZOPYRIDINE HCL 100 MG PO TABS: 100.0000 mg | ORAL_TABLET | Freq: Three times a day (TID) | ORAL | 0 refills | Status: AC | PRN

## 2023-04-16 NOTE — ED Triage Notes (Signed)
Patient reports dysuria x 2 days and denies urinary frequency.

## 2023-04-16 NOTE — ED Provider Notes (Signed)
[facility]    CSN: [id number] Arrival date & time: 04/16/23  1116      History   Chief Complaint Chief Complaint  Patient presents with   Dysuria    HPI Joann Campos is a 68 y.o. female.  2 day history of intermittent dysuria No urgency, frequency, or hematuria Denies abd or flank pain No fever Drinks caffeine free and sugar free iced tea  Past Medical History:  Diagnosis Date   Diabetes (HCC) 09/2021   Hip pain 2021   Hyperlipidemia     Patient Active Problem List   Diagnosis Date Noted   History of colonic polyps 10/13/2022   Abnormal finding on GI tract imaging 08/18/2022   Screening for cervical cancer 08/05/2022   Depression 08/05/2022   Rectal adenoma 03/01/2022   Grade III internal hemorrhoids 03/01/2022   Mixed hyperlipidemia 09/30/2021   Type 2 diabetes mellitus with hyperlipidemia (HCC) 09/30/2021   Diabetic neuropathy (HCC) 09/29/2021   Healthcare maintenance 09/29/2021   History of iritis 09/29/2021   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    Past Surgical History:  Procedure Laterality Date   BREAST REDUCTION SURGERY Bilateral    1990s   CHOLECYSTECTOMY     COLONOSCOPY WITH PROPOFOL N/A 03/01/2022   Procedure: COLONOSCOPY WITH PROPOFOL;  Surgeon: Shellia Cleverly, DO;  Location: [facility];  Service: Gastroenterology;  Laterality: N/A;   ENDOSCOPIC MUCOSAL RESECTION N/A 03/01/2022   Procedure: ENDOSCOPIC MUCOSAL RESECTION;  Surgeon: Shellia Cleverly, DO;  Location: [facility];  Service: Gastroenterology;  Laterality: N/A;   FLEXIBLE SIGMOIDOSCOPY N/A 10/13/2022   Procedure: FLEXIBLE SIGMOIDOSCOPY;  Surgeon: Shellia Cleverly, DO;  Location: [facility];  Service: Gastroenterology;  Laterality: N/A;   SCLEROTHERAPY  03/01/2022   Procedure: SCLEROTHERAPY;  Surgeon: Shellia Cleverly, DO;  Location: [facility];  Service: Gastroenterology;;    OB History   No obstetric history on file.      Home Medications    Prior to  Admission medications   Medication Sig Start Date End Date Taking? Authorizing Provider  phenazopyridine (PYRIDIUM) 100 MG tablet Take 1 tablet (100 mg total) by mouth 3 (three) times daily as needed for up to 2 days for pain. 04/16/23 04/18/23 Yes Saraia Platner, Lurena Joiner, PA-C  Cyanocobalamin (VITAMIN B 12 PO) Take 1 tablet by mouth daily. One tablet    [provider]  FLUoxetine (PROZAC) 10 MG capsule TAKE 1 CAPSULE BY MOUTH DAILY 12/07/22   Modena Slater, DO  gabapentin (NEURONTIN) 300 MG capsule TAKE 2 CAPSULES BY MOUTH AT  BEDTIME 11/15/22   Modena Slater, DO  glucose blood (CONTOUR NEXT TEST) test strip Use as instructed 09/30/22   Rana Snare, DO  insulin glargine (LANTUS) 100 UNIT/ML Solostar Pen Inject 8 Units into the skin at bedtime. 09/30/22   Rana Snare, DO  Insulin Pen Needle (PEN NEEDLES) 32G X 4 MM MISC 1 each by Does not apply route daily. 09/30/22   Rana Snare, DO  metFORMIN (GLUCOPHAGE-XR) 500 MG 24 hr tablet Take 2 tablets (1,000 mg total) by mouth 2 (two) times daily with a meal. 08/05/22 08/05/23  Modena Slater, DO  Microlet Lancets MISC 1 each by Does not apply route daily. 09/30/22   Rana Snare, DO  Multiple Vitamins-Minerals (MULTIVITAMIN ADULTS PO) Take 1 tablet by mouth daily. Women over 50    [provider]  prochlorperazine (COMPAZINE) 10 MG tablet Take 1 tablet (10 mg total) by mouth every 6 (six) hours as needed for nausea or vomiting. 08/19/22  Azucena Fallen, MD  rosuvastatin (CRESTOR) 20 MG tablet TAKE 1 TABLET BY MOUTH DAILY 12/07/22   Modena Slater, DO    Family History Family History  Problem Relation Age of Onset   Colon cancer Mother    Hypercholesterolemia Father    Diabetes Brother    CVA Brother    CVA Sibling    Crohn's disease Neg Hx    Esophageal cancer Neg Hx    Rectal cancer Neg Hx    Stomach cancer Neg Hx    Ulcerative colitis Neg Hx     Social History Social History   Tobacco Use   Smoking status: Former    Types:  Cigarettes    Passive exposure: Never   Smokeless tobacco: Never  Vaping Use   Vaping status: Never Used  Substance Use Topics   Alcohol use: Never   Drug use: Never     Allergies   Patient has no known allergies.   Review of Systems Review of Systems  Genitourinary:  Positive for dysuria.   Per HPI  Physical Exam Triage Vital Signs ED Triage Vitals  Encounter Vitals Group     BP 04/16/23 1303 119/69     Systolic BP Percentile --      Diastolic BP Percentile --      Pulse Rate 04/16/23 1303 75     Resp 04/16/23 1303 14     Temp 04/16/23 1303 97.9 F (36.6 C)     Temp Source 04/16/23 1303 Oral     SpO2 04/16/23 1303 98 %     Weight --      Height --      Head Circumference --      Peak Flow --      Pain Score 04/16/23 1302 4     Pain Loc --      Pain Education --      Exclude from Growth Chart --    No data found.  Updated Vital Signs BP 119/69 (BP Location: Right Arm)   Pulse 75   Temp 97.9 F (36.6 C) (Oral)   Resp 14   SpO2 98%   Visual Acuity Right Eye Distance:   Left Eye Distance:   Bilateral Distance:    Right Eye Near:   Left Eye Near:    Bilateral Near:     Physical Exam Vitals and nursing note reviewed.  Constitutional:      General: She is not in acute distress. HENT:     Mouth/Throat:     Mouth: Mucous membranes are moist.     Pharynx: Oropharynx is clear.  Eyes:     Conjunctiva/sclera: Conjunctivae normal.     Pupils: Pupils are equal, round, and reactive to light.  Cardiovascular:     Rate and Rhythm: Normal rate and regular rhythm.     Heart sounds: Normal heart sounds.  Pulmonary:     Effort: Pulmonary effort is normal.     Breath sounds: Normal breath sounds.  Abdominal:     Palpations: Abdomen is soft.     Tenderness: There is no abdominal tenderness. There is no right CVA tenderness, left CVA tenderness or guarding.  Neurological:     Mental Status: She is alert and oriented to person, place, and time.      UC  Treatments / Results  Labs (all labs ordered are listed, but only abnormal results are displayed) Labs Reviewed  POCT URINALYSIS DIP (MANUAL ENTRY) - Abnormal; Notable for the following components:  Result Value   Color, UA colorless (*)    Spec Grav, UA <=1.005 (*)    Blood, UA trace-intact (*)    All other components within normal limits  URINE CULTURE    EKG   Radiology No results found.  Procedures Procedures (including critical care time)  Medications Ordered in UC Medications - No data to display  Initial Impression / Assessment and Plan / UC Course  I have reviewed the triage vital signs and the nursing notes.  Pertinent labs & imaging results that were available during my care of the patient were reviewed by me and considered in my medical decision making (see chart for details).  Afebrile, well appearing, non tender on exam UA with trace RBC, otherwise normal. Pending culture Try pyridium in the meantime, increase water intake Will contact if other treatment needed Patient is agreeable to plan, no questions  Final Clinical Impressions(s) / UC Diagnoses   Final diagnoses:  Dysuria     Discharge Instructions      We will call you if anything results on your urine culture requiring a change in treatment  In the meantime you can try the Pyridium (also known as AZO) which can help with urinary pain Take 2 or 3 times daily for no more than 2 days in a row It will turn your urine bright orange!  Drink lots of water!    ED Prescriptions     Medication Sig Dispense Auth. Provider   phenazopyridine (PYRIDIUM) 100 MG tablet Take 1 tablet (100 mg total) by mouth 3 (three) times daily as needed for up to 2 days for pain. 6 tablet Alexina Niccoli, Lurena Joiner, PA-C      PDMP not reviewed this encounter.   Marlow Baars, [place] 04/16/23 1451

## 2023-04-16 NOTE — Discharge Instructions (Addendum)
We will call you if anything results on your urine culture requiring a change in treatment  In the meantime you can try the Pyridium (also known as AZO) which can help with urinary pain Take 2 or 3 times daily for no more than 2 days in a row It will turn your urine bright orange!  Drink lots of water!

## 2023-04-18 ENCOUNTER — Telehealth: Payer: Self-pay

## 2023-04-18 LAB — URINE CULTURE: Culture: 20000 — AB

## 2023-04-18 MED ORDER — CEPHALEXIN 500 MG PO CAPS: 500.0000 mg | ORAL_CAPSULE | Freq: Two times a day (BID) | ORAL | 0 refills | Status: AC

## 2023-04-18 NOTE — Telephone Encounter (Signed)
Per Helaine Chess,  PA-C, "If her symptoms have improved then no treatment is needed. If her symptoms have not improved, I would send her Keflex 500 mg twice daily x 5 days." TC to pt, who reports continued symptoms.  Reviewed with patient, verified pharmacy, prescription sent.

## 2023-05-22 ENCOUNTER — Encounter: Payer: Self-pay | Admitting: Gastroenterology

## 2023-05-29 ENCOUNTER — Encounter: Payer: Self-pay | Admitting: Gastroenterology

## 2023-06-22 ENCOUNTER — Ambulatory Visit (AMBULATORY_SURGERY_CENTER)

## 2023-06-22 VITALS — Ht 65.0 in | Wt 130.0 lb

## 2023-06-22 DIAGNOSIS — Z860100 Personal history of colon polyps, unspecified: Secondary | ICD-10-CM

## 2023-06-22 MED ORDER — NA SULFATE-K SULFATE-MG SULF 17.5-3.13-1.6 GM/177ML PO SOLN: 1.0000 | Freq: Once | ORAL | 0 refills | Status: AC

## 2023-06-22 NOTE — Progress Notes (Signed)
 Pre visit completed via phone call; Patient verified name, DOB, and address; No egg or soy allergy known to patient;  No issues known to pt with past sedation with any surgeries or procedures; Patient denies ever being told they had issues or difficulty with intubation; No FH of Malignant Hyperthermia; Pt is not on diet pills; Pt is not on home 02;  Pt is not on blood thinners;  Pt denies issues with constipation;  No A fib or A flutter Have any cardiac testing pending--NO Insurance verified during PV appt--- UHC Pt can ambulate without assistance;  Pt denies use of chewing tobacco; Discussed diabetic/weight loss medication holds; Discussed NSAID holds; Checked BMI to be less than 50; Pt instructed to use [employer] or GoodRx for a price reduction on prep  Patient's chart reviewed by Cathlyn Parsons CNRA prior to previsit and patient appropriate for the [facility].  Pre visit completed and red dot placed by patient's name on their procedure day (on provider's schedule).    Suprep prep requested per patient.  Instructions sent to MyChart per patient request;

## 2023-06-23 ENCOUNTER — Other Ambulatory Visit: Payer: Self-pay | Admitting: Student

## 2023-06-23 DIAGNOSIS — E1169 Type 2 diabetes mellitus with other specified complication: Secondary | ICD-10-CM

## 2023-06-23 NOTE — Telephone Encounter (Signed)
 Patient last seen 09/30/22, I called the patient to schedule a follow up appointment in order for [place] to continue to refill her medications. Unable to reach the patient I left her a voicemail.

## 2023-07-11 ENCOUNTER — Encounter: Payer: Self-pay | Admitting: Gastroenterology

## 2023-07-11 ENCOUNTER — Telehealth: Payer: Self-pay | Admitting: Gastroenterology

## 2023-07-11 NOTE — Telephone Encounter (Signed)
 Inbound call from patient, states she forgot about the diet restrictions and ate a bag of cracker jacks. Patient's procedure is on 4/24, she would like to know if she can continue with procedure.

## 2023-07-11 NOTE — Telephone Encounter (Signed)
 Pt ate a bag of cracker jacks today, states she also struggles w constipation, encourage 2-4 dulcolax tonight extra fluids today and tomorrow before prep, pt verb understanding

## 2023-07-12 ENCOUNTER — Other Ambulatory Visit: Payer: Self-pay | Admitting: Student

## 2023-07-12 DIAGNOSIS — E119 Type 2 diabetes mellitus without complications: Secondary | ICD-10-CM

## 2023-07-13 ENCOUNTER — Encounter: Payer: Self-pay | Admitting: Gastroenterology

## 2023-07-13 ENCOUNTER — Ambulatory Visit: Admitting: Gastroenterology

## 2023-07-13 VITALS — BP 116/64 | HR 62 | Temp 98.0°F | Resp 13 | Ht 65.0 in | Wt 130.0 lb

## 2023-07-13 DIAGNOSIS — Z860101 Personal history of adenomatous and serrated colon polyps: Secondary | ICD-10-CM | POA: Diagnosis not present

## 2023-07-13 DIAGNOSIS — D128 Benign neoplasm of rectum: Secondary | ICD-10-CM

## 2023-07-13 DIAGNOSIS — K648 Other hemorrhoids: Secondary | ICD-10-CM

## 2023-07-13 DIAGNOSIS — K621 Rectal polyp: Secondary | ICD-10-CM | POA: Diagnosis not present

## 2023-07-13 DIAGNOSIS — Z1211 Encounter for screening for malignant neoplasm of colon: Secondary | ICD-10-CM

## 2023-07-13 DIAGNOSIS — K649 Unspecified hemorrhoids: Secondary | ICD-10-CM

## 2023-07-13 DIAGNOSIS — K5730 Diverticulosis of large intestine without perforation or abscess without bleeding: Secondary | ICD-10-CM

## 2023-07-13 DIAGNOSIS — K641 Second degree hemorrhoids: Secondary | ICD-10-CM

## 2023-07-13 DIAGNOSIS — Z860100 Personal history of colon polyps, unspecified: Secondary | ICD-10-CM

## 2023-07-13 DIAGNOSIS — Z800 Family history of malignant neoplasm of digestive organs: Secondary | ICD-10-CM | POA: Diagnosis not present

## 2023-07-13 MED ORDER — SODIUM CHLORIDE 0.9 % IV SOLN
500.0000 mL | Freq: Once | INTRAVENOUS | Status: DC
Start: 2023-07-13 — End: 2023-07-13

## 2023-07-13 NOTE — Progress Notes (Signed)
 Called to room to assist during endoscopic procedure.  Patient ID and intended procedure confirmed with present staff. Received instructions for my participation in the procedure from the performing physician.

## 2023-07-13 NOTE — Progress Notes (Signed)
 Report to PACU, RN, vss, BBS= Clear.

## 2023-07-13 NOTE — Progress Notes (Signed)
 Pt's states no medical or surgical changes since previsit or office visit.

## 2023-07-13 NOTE — Patient Instructions (Addendum)
 Resume regular diet Resume regular medications See handouts for polyps, diverticulosis and hemorrhoids Await pathology results  Repeat colonoscopy in 3 years for surveillance, return to GI office as needed YOU HAD AN ENDOSCOPIC PROCEDURE TODAY AT THE [facility]:   Refer to the procedure report that was given to you for any specific questions about what was found during the examination.  If the procedure report does not answer your questions, please call your gastroenterologist to clarify.  If you requested that your care partner not be given the details of your procedure findings, then the procedure report has been included in a sealed envelope for you to review at your convenience later.  YOU SHOULD EXPECT: Some feelings of bloating in the abdomen. Passage of more gas than usual.  Walking can help get rid of the air that was put into your GI tract during the procedure and reduce the bloating. If you had a lower endoscopy (such as a colonoscopy or flexible sigmoidoscopy) you may notice spotting of blood in your stool or on the toilet paper. If you underwent a bowel prep for your procedure, you may not have a normal bowel movement for a few days.  Please Note:  You might notice some irritation and congestion in your nose or some drainage.  This is from the oxygen used during your procedure.  There is no need for concern and it should clear up in a day or so.  SYMPTOMS TO REPORT IMMEDIATELY: Following lower endoscopy (colonoscopy or flexible sigmoidoscopy):  Excessive amounts of blood in the stool  Significant tenderness or worsening of abdominal pains  Swelling of the abdomen that is new, acute  Fever of 100F or higher  For urgent or emergent issues, a gastroenterologist can be reached at any hour by calling [phone]. Do not use MyChart messaging for urgent concerns.   DIET:  We do recommend a small meal at first, but then you may proceed to your regular diet.  Drink plenty  of fluids but you should avoid alcoholic beverages for 24 hours.  ACTIVITY:  You should plan to take it easy for the rest of today and you should NOT DRIVE or use heavy machinery until tomorrow (because of the sedation medicines used during the test).    FOLLOW UP: Our staff will call the number listed on your records the next business day following your procedure.  We will call around 7:15- 8:00 am to check on you and address any questions or concerns that you may have regarding the information given to you following your procedure. If we do not reach you, we will leave a message.     If any biopsies were taken you will be contacted by phone or by letter within the next 1-3 weeks.  Please call us  at [phone] if you have not heard about the biopsies in 3 weeks.   SIGNATURES/CONFIDENTIALITY: You and/or your care partner have signed paperwork which will be entered into your electronic medical record.  These signatures attest to the fact that that the information above on your After Visit Summary has been reviewed and is understood.  Full responsibility of the confidentiality of this discharge information lies with you and/or your care-partner.

## 2023-07-13 NOTE — Op Note (Signed)
 [facility] Patient Name: Joann Campos Procedure Date: 07/13/2023 7:23 AM MRN: [id number] Endoscopist: Harry Lindau , MD, [id number] Age: 68 Referring MD:  Date of Birth: 11-Mar-1956 Gender: Female Account #: [id number] Procedure:                Colonoscopy Indications:              High risk colon cancer surveillance: Personal                            history of colonic polyps                           - 01/04/2022: Colonoscopy: 2 subcentimeter adenomas                            removed with cold snare, internal hemorrhoids, and                            a large 40 mm rectal polyp with thick stalk located                            10- 12 cm from the anal verge (biopsied: TVA).                            Medium size internal hemorrhoids                           - 03/01/2022: Colonoscopy: 50 mm polyp with thick                            stalk resected using a combination of epinephrine ,                            Endoloop, and hot snare, then resection of edges                            cold forceps via avulsion technique (path: TVA with                            foci of high-grade dysplasia, negative for                            carcinoma. Identified margins are negative for                            adenoma). Recommended repeat in 6 months via                            flexible sigmoidoscopy                           - 10/13/2022: Flexible sigmoidoscopy: Large amount                            of stool  interfering with visualization. No obvious                            recurrent polypoid tissue in the rectum but overall                            poor visualization. Grade 2 internal hemorrhoids. Medicines:                Monitored Anesthesia Care Procedure:                Pre-Anesthesia Assessment:                           - Prior to the procedure, a History and Physical                            was performed, and patient medications and                             allergies were reviewed. The patient's tolerance of                            previous anesthesia was also reviewed. The risks                            and benefits of the procedure and the sedation                            options and risks were discussed with the patient.                            All questions were answered, and informed consent                            was obtained. Prior Anticoagulants: The patient has                            taken no anticoagulant or antiplatelet agents. ASA                            Grade Assessment: II - A patient with mild systemic                            disease. After reviewing the risks and benefits,                            the patient was deemed in satisfactory condition to                            undergo the procedure.                           After obtaining informed consent, the colonoscope  was passed under direct vision. Throughout the                            procedure, the patient's blood pressure, pulse, and                            oxygen saturations were monitored continuously. The                            [id number]-HQ190L Colonoscope [id number] was introduced                            through the anus and advanced to the the terminal                            ileum. The colonoscopy was performed without                            difficulty. The patient tolerated the procedure                            well. The quality of the bowel preparation was                            good. The terminal ileum, ileocecal valve,                            appendiceal orifice, and rectum were photographed. Scope In: 8:02:08 AM Scope Out: 8:21:13 AM Scope Withdrawal Time: 0 hours 15 minutes 16 seconds  Total Procedure Duration: 0 hours 19 minutes 5 seconds  Findings:                 Hemorrhoids were found on perianal exam.                           Multiple small-mouthed diverticula  were found in                            the sigmoid colon and descending colon.                           A 3 mm polyp was found in the rectum. The polyp was                            sessile. The polyp was removed with a cold snare.                            Resection and retrieval were complete. Estimated                            blood loss was minimal.                           Non-bleeding internal hemorrhoids were found during  retroflexion. The hemorrhoids were small.                           The terminal ileum appeared normal. Complications:            No immediate complications. Estimated Blood Loss:     Estimated blood loss was minimal. Impression:               - Hemorrhoids found on perianal exam.                           - Diverticulosis in the sigmoid colon and in the                            descending colon.                           - One 3 mm polyp in the rectum, removed with a cold                            snare. Resected and retrieved.                           - Non-bleeding internal hemorrhoids.                           - The examined portion of the ileum was normal. Recommendation:           - Patient has a contact number available for                            emergencies. The signs and symptoms of potential                            delayed complications were discussed with the                            patient. Return to normal activities tomorrow.                            Written discharge instructions were provided to the                            patient.                           - Resume previous diet.                           - Continue present medications.                           - Await pathology results.                           - Repeat colonoscopy in 3 years for surveillance.                           -  Return to GI office PRN. Harry Lindau, MD 07/13/2023 8:28:20 AM

## 2023-07-13 NOTE — Progress Notes (Signed)
 GASTROENTEROLOGY PROCEDURE H&P NOTE   Primary Care Physician: Jonelle Neri, DO    Reason for Procedure:  Colon polyp surveillance  Plan:    Colonoscopy  Patient is appropriate for endoscopic procedure(s) in the ambulatory ([facility]) setting.  The nature of the procedure, as well as the risks, benefits, and alternatives were carefully and thoroughly reviewed with the patient. Ample time for discussion and questions allowed. The patient understood, was satisfied, and agreed to proceed.     HPI: Joann Campos is a 68 y.o. female who presents for colonoscopy for ongoing colon polyp surveillance and colon cancer screening.  No active GI symptoms. Patient is otherwise without complaints or active issues today.  Family history notable for mother with colon cancer age 27.   Endoscopic History: - 01/04/2022: Colonoscopy: 2 subcentimeter adenomas removed with cold snare, internal hemorrhoids, and a large 40 mm rectal polyp with thick stalk located 10- 12 cm from the anal verge (biopsied: TVA).  Medium size internal hemorrhoids - 03/01/2022: Colonoscopy: 50 mm polyp with thick stalk resected using a combination of epinephrine , Endoloop, and hot snare, then resection of edges cold forceps via avulsion technique (path: TVA with foci of high-grade dysplasia, negative for carcinoma.  Identified margins are negative for adenoma).  Recommended repeat in 6 months via flexible sigmoidoscopy - 10/13/2022: Flexible sigmoidoscopy: Large amount of stool interfering with visualization.  No obvious recurrent polypoid tissue in the rectum but overall poor visualization.  Grade 2 internal hemorrhoids.  Past Medical History:  Diagnosis Date   Diabetes (HCC) 09/2021   Hip pain 2021   Hyperlipidemia     Past Surgical History:  Procedure Laterality Date   BREAST REDUCTION SURGERY Bilateral    1990s   CHOLECYSTECTOMY     COLONOSCOPY WITH PROPOFOL  N/A 03/01/2022   Procedure: COLONOSCOPY WITH PROPOFOL ;   Surgeon: Annis Kinder, DO;  Location: [facility];  Service: Gastroenterology;  Laterality: N/A;   ENDOSCOPIC MUCOSAL RESECTION N/A 03/01/2022   Procedure: ENDOSCOPIC MUCOSAL RESECTION;  Surgeon: Annis Kinder, DO;  Location: [facility];  Service: Gastroenterology;  Laterality: N/A;   FLEXIBLE SIGMOIDOSCOPY N/A 10/13/2022   Procedure: FLEXIBLE SIGMOIDOSCOPY;  Surgeon: Annis Kinder, DO;  Location: [facility];  Service: Gastroenterology;  Laterality: N/A;   SCLEROTHERAPY  03/01/2022   Procedure: SCLEROTHERAPY;  Surgeon: Annis Kinder, DO;  Location: [facility];  Service: Gastroenterology;;    Prior to Admission medications   Medication Sig Start Date End Date Taking? Authorizing Provider  Cyanocobalamin (VITAMIN B 12 PO) Take 1 tablet by mouth daily. One tablet   Yes [provider]  FLUoxetine  (PROZAC ) 10 MG capsule TAKE 1 CAPSULE BY MOUTH DAILY 12/07/22  Yes Jonelle Neri, DO  gabapentin  (NEURONTIN ) 300 MG capsule TAKE 2 CAPSULES BY MOUTH AT  BEDTIME 11/15/22  Yes Jonelle Neri, DO  glucose blood (CONTOUR NEXT TEST) test strip Use as instructed 09/30/22  Yes Jearldine Mina, DO  insulin  glargine (LANTUS  SOLOSTAR) 100 UNIT/ML Solostar Pen Inject 10 Units into the skin at bedtime. 06/23/23  Yes Jonelle Neri, DO  Insulin  Pen Needle (PEN NEEDLES) 32G X 4 MM MISC 1 each by Does not apply route daily. 09/30/22  Yes Zheng, Michael, DO  metFORMIN  (GLUCOPHAGE -XR) 500 MG 24 hr tablet Take 2 tablets (1,000 mg total) by mouth 2 (two) times daily with a meal. 08/05/22 08/05/23 Yes Jonelle Neri, DO  Microlet Lancets MISC 1 each by Does not apply route daily. 09/30/22  Yes Zheng, Michael, DO  Multiple Vitamins-Minerals (MULTIVITAMIN ADULTS  PO) Take 1 tablet by mouth daily. Women over 50   Yes [provider]  rosuvastatin  (CRESTOR ) 20 MG tablet TAKE 1 TABLET BY MOUTH DAILY 12/07/22  Yes Jonelle Neri, DO    Current Outpatient Medications  Medication Sig Dispense Refill    Cyanocobalamin (VITAMIN B 12 PO) Take 1 tablet by mouth daily. One tablet     FLUoxetine  (PROZAC ) 10 MG capsule TAKE 1 CAPSULE BY MOUTH DAILY 90 capsule 3   gabapentin  (NEURONTIN ) 300 MG capsule TAKE 2 CAPSULES BY MOUTH AT  BEDTIME 180 capsule 3   glucose blood (CONTOUR NEXT TEST) test strip Use as instructed 100 each 3   insulin  glargine (LANTUS  SOLOSTAR) 100 UNIT/ML Solostar Pen Inject 10 Units into the skin at bedtime. 15 mL 3   Insulin  Pen Needle (PEN NEEDLES) 32G X 4 MM MISC 1 each by Does not apply route daily. 100 each 3   metFORMIN  (GLUCOPHAGE -XR) 500 MG 24 hr tablet Take 2 tablets (1,000 mg total) by mouth 2 (two) times daily with a meal. 360 tablet 3   Microlet Lancets MISC 1 each by Does not apply route daily. 100 each 3   Multiple Vitamins-Minerals (MULTIVITAMIN ADULTS PO) Take 1 tablet by mouth daily. Women over 50     rosuvastatin  (CRESTOR ) 20 MG tablet TAKE 1 TABLET BY MOUTH DAILY 90 tablet 3   Current Facility-Administered Medications  Medication Dose Route Frequency Provider Last Rate Last Admin   0.9 %  sodium chloride  infusion  500 mL Intravenous Once Leilah Polimeni V, DO        Allergies as of 07/13/2023   (No Known Allergies)    Family History  Problem Relation Age of Onset   Colon polyps Mother 50   Colon cancer Mother 48   Hypercholesterolemia Father    Diabetes Brother    CVA Brother    CVA Sibling    Crohn's disease Neg Hx    Esophageal cancer Neg Hx    Rectal cancer Neg Hx    Stomach cancer Neg Hx    Ulcerative colitis Neg Hx     Social History   Socioeconomic History   Marital status: Married    Spouse name: Not on file   Number of children: 0   Years of education: Not on file   Highest education level: Not on file  Occupational History   Not on file  Tobacco Use   Smoking status: Former    Types: Cigarettes    Passive exposure: Never   Smokeless tobacco: Never  Vaping Use   Vaping status: Never Used  Substance and Sexual Activity    Alcohol use: Never   Drug use: Never   Sexual activity: Not on file  Other Topics Concern   Not on file  Social History Narrative   Not on file   Social Drivers of Health   Financial Resource Strain: Not on file  Food Insecurity: No Food Insecurity (08/18/2022)   Hunger Vital Sign    Worried About Running Out of Food in the Last Year: Never true    Ran Out of Food in the Last Year: Never true  Transportation Needs: No Transportation Needs (08/18/2022)   PRAPARE - Administrator, Civil Service (Medical): No    Lack of Transportation (Non-Medical): No  Physical Activity: Not on file  Stress: Not on file  Social Connections: Not on file  Intimate Partner Violence: Not At Risk (08/18/2022)   Humiliation, Afraid, Rape, and Kick questionnaire  Fear of Current or Ex-Partner: No    Emotionally Abused: No    Physically Abused: No    Sexually Abused: No    Physical Exam: Vital signs in last 24 hours: @BP  108/64   Pulse 86   Temp 98 F (36.7 C) (Temporal)   Ht 5\' 5"  (1.651 m)   Wt 130 lb (59 kg)   SpO2 95%   BMI 21.63 kg/m  GEN: NAD EYE: Sclerae anicteric ENT: MMM CV: Non-tachycardic Pulm: CTA b/l GI: Soft, NT/ND NEURO:  Alert & Oriented x 3   Harry Lindau, DO [facility]   07/13/2023 7:37 AM

## 2023-07-14 ENCOUNTER — Other Ambulatory Visit: Payer: Self-pay | Admitting: Student

## 2023-07-14 ENCOUNTER — Telehealth: Payer: Self-pay

## 2023-07-14 DIAGNOSIS — Z1231 Encounter for screening mammogram for malignant neoplasm of breast: Secondary | ICD-10-CM

## 2023-07-14 NOTE — Telephone Encounter (Signed)
 Left message on follow up call.

## 2023-07-17 LAB — SURGICAL PATHOLOGY

## 2023-07-18 ENCOUNTER — Ambulatory Visit
Admission: RE | Admit: 2023-07-18 | Discharge: 2023-07-18 | Disposition: A | Discharge status: Home / Self Care | Source: Ambulatory Visit

## 2023-07-18 DIAGNOSIS — Z1231 Encounter for screening mammogram for malignant neoplasm of breast: Secondary | ICD-10-CM

## 2023-07-19 ENCOUNTER — Encounter: Payer: Self-pay | Admitting: Gastroenterology

## 2023-07-20 ENCOUNTER — Other Ambulatory Visit: Payer: Self-pay | Admitting: Student

## 2023-07-20 DIAGNOSIS — E1169 Type 2 diabetes mellitus with other specified complication: Secondary | ICD-10-CM

## 2023-07-21 NOTE — Telephone Encounter (Signed)
 Medication sent to pharmacy

## 2023-07-24 ENCOUNTER — Encounter: Payer: Self-pay | Admitting: Student

## 2023-07-25 ENCOUNTER — Encounter: Payer: Self-pay | Admitting: Student

## 2023-07-25 ENCOUNTER — Ambulatory Visit: Payer: Self-pay | Admitting: Student

## 2023-07-25 VITALS — BP 113/67 | HR 90 | Temp 98.3°F | Ht 65.0 in | Wt 138.4 lb

## 2023-07-25 DIAGNOSIS — Z23 Encounter for immunization: Secondary | ICD-10-CM | POA: Diagnosis not present

## 2023-07-25 DIAGNOSIS — E1169 Type 2 diabetes mellitus with other specified complication: Secondary | ICD-10-CM | POA: Diagnosis not present

## 2023-07-25 DIAGNOSIS — E1140 Type 2 diabetes mellitus with diabetic neuropathy, unspecified: Secondary | ICD-10-CM | POA: Diagnosis not present

## 2023-07-25 DIAGNOSIS — Z7984 Long term (current) use of oral hypoglycemic drugs: Secondary | ICD-10-CM

## 2023-07-25 DIAGNOSIS — Z794 Long term (current) use of insulin: Secondary | ICD-10-CM

## 2023-07-25 DIAGNOSIS — E782 Mixed hyperlipidemia: Secondary | ICD-10-CM | POA: Diagnosis not present

## 2023-07-25 DIAGNOSIS — Z13820 Encounter for screening for osteoporosis: Secondary | ICD-10-CM | POA: Insufficient documentation

## 2023-07-25 DIAGNOSIS — E1349 Other specified diabetes mellitus with other diabetic neurological complication: Secondary | ICD-10-CM

## 2023-07-25 LAB — GLUCOSE, CAPILLARY: Glucose-Capillary: 109 mg/dL — ABNORMAL HIGH (ref 70–99)

## 2023-07-25 LAB — POCT GLYCOSYLATED HEMOGLOBIN (HGB A1C): Hemoglobin A1C: 6.8 % — AB (ref 4.0–5.6)

## 2023-07-25 NOTE — Assessment & Plan Note (Signed)
 Patient has come a long way with her diabetes.  When I first met her her A1c was greater than 12.  Today her A1c is down to 6.8.  Patient current medical treatment includes metformin  1000 mg twice daily and Lantus  10 units nightly.  Patient denies any hypoglycemic episodes.  She states she is feeling great.  She does report she is gaining some weight.  Otherwise she has no concerns.  Plan: - Continue Lantus  10 units nightly - Continue metformin  1000 mg twice daily - Continue glucose monitoring - Follow-up A1c in 3 months

## 2023-07-25 NOTE — Progress Notes (Signed)
 Internal Medicine Clinic Attending  Case discussed with the resident at the time of the visit.  We reviewed the resident's history and exam and pertinent patient test results.  I agree with the assessment, diagnosis, and plan of care documented in the resident's note.

## 2023-07-25 NOTE — Progress Notes (Signed)
 CC: Follow up Visit   HPI:  [name] is a 68 y.o. female with a past medical history of type 2 diabetes, frozen shoulder, , hyperlipidemia who presents for follow-up appointment.  Please see assessment and plan for full HPI.  Medications: Diabetes: Metformin  1000 mg twice daily, Lantus  10 units nightly Hyperlipidemia: Crestor  20 mg daily Depression: Prozac  10 mg daily Vitamin B12 deficiency: Vitamin B12 supplementation Diabetic neuropathy: Gabapentin  600 mg nightly  Past Medical History:  Diagnosis Date   Diabetes (HCC) 09/2021   Hip pain 2021   Hyperlipidemia      Current Outpatient Medications:    Cyanocobalamin (VITAMIN B 12 PO), Take 1 tablet by mouth daily. One tablet, Disp: , Rfl:    FLUoxetine  (PROZAC ) 10 MG capsule, TAKE 1 CAPSULE BY MOUTH DAILY, Disp: 90 capsule, Rfl: 3   gabapentin  (NEURONTIN ) 300 MG capsule, TAKE 2 CAPSULES BY MOUTH AT  BEDTIME, Disp: 180 capsule, Rfl: 3   glucose blood (CONTOUR NEXT TEST) test strip, Use as instructed, Disp: 100 each, Rfl: 3   insulin  glargine (LANTUS  SOLOSTAR) 100 UNIT/ML Solostar Pen, Inject 10 Units into the skin at bedtime., Disp: 15 mL, Rfl: 3   Insulin  Pen Needle (PEN NEEDLES) 32G X 4 MM MISC, 1 each by Does not apply route daily., Disp: 100 each, Rfl: 3   metFORMIN  (GLUCOPHAGE -XR) 500 MG 24 hr tablet, TAKE 2 TABLETS BY MOUTH TWICE  DAILY WITH A MEAL, Disp: 360 tablet, Rfl: 3   Microlet Lancets MISC, TEST ONCE DAILY, Disp: 100 each, Rfl: 3   Multiple Vitamins-Minerals (MULTIVITAMIN ADULTS PO), Take 1 tablet by mouth daily. Women over 50, Disp: , Rfl:    rosuvastatin  (CRESTOR ) 20 MG tablet, TAKE 1 TABLET BY MOUTH DAILY, Disp: 90 tablet, Rfl: 3  Review of Systems:   Negative except for what is stated in HPI   Physical Exam:  Vitals:   07/25/23 0837  BP: 113/67  Pulse: 90  Temp: 98.3 F (36.8 C)  TempSrc: Oral  SpO2: 98%  Weight: 138 lb 6.4 oz (62.8 kg)  Height: 5\' 5"  (1.651 m)   General: Patient is  sitting comfortably in the room  Head: Normocephalic, atraumatic  Cardio: Regular rate and rhythm, no murmurs, rubs or gallops Pulmonary: Clear to ausculation bilaterally with no rales, rhonchi, and crackles    Assessment & Plan:   Type 2 diabetes mellitus with hyperlipidemia (HCC) Patient has come a long way with her diabetes.  When I first met her her A1c was greater than 12.  Today her A1c is down to 6.8.  Patient current medical treatment includes metformin  1000 mg twice daily and Lantus  10 units nightly.  Patient denies any hypoglycemic episodes.  She states she is feeling great.  She does report she is gaining some weight.  Otherwise she has no concerns.  Plan: - Continue Lantus  10 units nightly - Continue metformin  1000 mg twice daily - Continue glucose monitoring - Follow-up A1c in 3 months  Diabetic neuropathy (HCC) Patient reports neuropathy is improving.  Currently taking gabapentin  600 mg nightly.  Plan: - Continue gabapentin  600 mg daily  Mixed hyperlipidemia Patient currently is taking rosuvastatin  20 mg daily.  LDL is at goal at 29.  She denies any concerns for myalgias or weakness.  Plan: - Continue Crestor  20 mg daily  Need for pneumococcal 20-valent conjugate vaccination Administered pneumonia vaccine today.  Osteoporosis screening Patient referred for DEXA scan today.  Patient discussed with Dr.  Sonia Durand, DO  PGY-2 Internal Medicine Resident

## 2023-07-25 NOTE — Assessment & Plan Note (Signed)
Patient referred for DEXA scan today.

## 2023-07-25 NOTE — Patient Instructions (Signed)
 Joann Campos, [name] you for allowing me to take part in your care today.  Here are your instructions.  1. Continue to keep up the good work, your A1c is down to 6.8. We can keep up the same medications.   2. I have ordered for you to have a DEXA scan to screen you for osteoporosis. Please await phone call to schedule.   3. Please come back in 1 month and we can do you PAP smear.   4. You have received your Pneumonia vaccine today.    Thank you, Dr. Lydia Sams  If you have any other questions please contact the internal medicine clinic at [phone] If it is after hours, please call the [facility] at [phone] and then ask the person who picks up for the resident on call.

## 2023-07-25 NOTE — Assessment & Plan Note (Signed)
 Patient reports neuropathy is improving.  Currently taking gabapentin  600 mg nightly.  Plan: - Continue gabapentin  600 mg daily

## 2023-07-25 NOTE — Assessment & Plan Note (Signed)
 Patient currently is taking rosuvastatin  20 mg daily.  LDL is at goal at 29.  She denies any concerns for myalgias or weakness.  Plan: - Continue Crestor  20 mg daily

## 2023-07-25 NOTE — Assessment & Plan Note (Signed)
 Administered pneumonia vaccine today.

## 2023-07-27 ENCOUNTER — Encounter: Payer: Self-pay | Admitting: Student

## 2023-07-27 LAB — BMP8+ANION GAP
Anion Gap: 16 mmol/L (ref 10.0–18.0)
BUN/Creatinine Ratio: 22 (ref 12–28)
BUN: 15 mg/dL (ref 8–27)
CO2: 24 mmol/L (ref 20–29)
Calcium: 9.3 mg/dL (ref 8.7–10.3)
Chloride: 102 mmol/L (ref 96–106)
Creatinine, Ser: 0.68 mg/dL (ref 0.57–1.00)
Glucose: 114 mg/dL — ABNORMAL HIGH (ref 70–99)
Potassium: 4.7 mmol/L (ref 3.5–5.2)
Sodium: 142 mmol/L (ref 134–144)
eGFR: 97 mL/min/{1.73_m2} (ref >59)

## 2023-09-18 ENCOUNTER — Encounter: Payer: Self-pay | Admitting: Surgical

## 2023-09-18 ENCOUNTER — Other Ambulatory Visit: Payer: Self-pay

## 2023-09-18 ENCOUNTER — Ambulatory Visit: Admitting: Surgical

## 2023-09-18 DIAGNOSIS — G8929 Other chronic pain: Secondary | ICD-10-CM

## 2023-09-18 DIAGNOSIS — M19011 Primary osteoarthritis, right shoulder: Secondary | ICD-10-CM | POA: Diagnosis not present

## 2023-09-18 DIAGNOSIS — M25511 Pain in right shoulder: Secondary | ICD-10-CM

## 2023-09-18 MED ORDER — BUPIVACAINE HCL 0.25 % IJ SOLN
9.0000 mL | INTRAMUSCULAR | Status: AC | PRN
  Administered 2023-09-18: 9 mL via INTRA_ARTICULAR

## 2023-09-18 MED ORDER — TRIAMCINOLONE ACETONIDE 40 MG/ML IJ SUSP
40.0000 mg | INTRAMUSCULAR | Status: AC | PRN
  Administered 2023-09-18: 40 mg via INTRA_ARTICULAR

## 2023-09-18 MED ORDER — LIDOCAINE HCL 1 % IJ SOLN
5.0000 mL | INTRAMUSCULAR | Status: AC | PRN
  Administered 2023-09-18: 5 mL

## 2023-09-18 NOTE — Progress Notes (Signed)
 Follow-up Office Visit Note   Patient: Joann Campos           Date of Birth: 24-Jun-1955           MRN: [id number] Visit Date: 09/18/2023 Requested by: Tobie Gaines, DO [address], [address] [place],  [place] [address] PCP: Tobie Gaines, DO  Subjective: Chief Complaint  Patient presents with   Right Shoulder - Injections    HPI: Joann Campos is a 68 y.o. female who returns to the office for follow-up visit.    Plan at last visit was: Right shoulder glenohumeral injection  Since then, patient notes she got good relief from prior injection which lasted for about 7 months.  She would like to repeat this injection today.  No new fall or injury.  Pain feels similar to the pain she has had in the past with no new features.  No radicular pain.              ROS: All systems reviewed are negative as they relate to the chief complaint within the history of present illness.  Patient denies fevers or chills.  Assessment & Plan: Visit Diagnoses:  1. Chronic right shoulder pain     Plan: [name] is a 68 y.o. female who returns to the office for follow-up visit.  Plan from last visit was noted above in HPI.  They now return with great relief from last glenohumeral injection on 02/27/2023 that only recently wore off.  She would like to repeat injection.  Injection administered under ultrasound guidance and patient tolerated procedure well without complication.  Follow-up in the office as needed with next possible injection 4 months from now.  Follow-Up Instructions: Return if symptoms worsen or fail to improve.   Orders:  No orders of the defined types were placed in this encounter.  No orders of the defined types were placed in this encounter.     Procedures: Large Joint Inj: R glenohumeral on 09/18/2023 9:14 AM Indications: pain and diagnostic evaluation Details: 22 G 3.5 in needle, ultrasound-guided posterior approach Medications: 5 mL lidocaine  1 %; 9 mL bupivacaine   0.25 %; 40 mg triamcinolone  acetonide 40 MG/ML Outcome: tolerated well, no immediate complications Procedure, treatment alternatives, risks and benefits explained, specific risks discussed. Consent was given by the patient. Immediately prior to procedure a time out was called to verify the correct patient, procedure, equipment, support staff and site/side marked as required. Patient was prepped and draped in the usual sterile fashion.       Clinical Data: No additional findings.  Objective: Vital Signs: There were no vitals taken for this visit.  Physical Exam:  Constitutional: Patient appears well-developed HEENT:  Head: Normocephalic Eyes:EOM are normal Neck: Normal range of motion Cardiovascular: Normal rate Pulmonary/chest: Effort normal Neurologic: Patient is alert Skin: Skin is warm Psychiatric: Patient has normal mood and affect  Ortho Exam: Ortho exam demonstrates right shoulder with 40 degrees external rotation, 90 degrees abduction, 150 degrees forward elevation passively and actively.  Excellent rotator cuff strength.  Axillary nerve intact with deltoid firing.  2+ radial pulse of the right upper extremity.  No cellulitis or skin changes noted around the right shoulder region.  Specialty Comments:  No specialty comments available.  Imaging: No results found.   PMFS History: Patient Active Problem List   Diagnosis Date Noted   Need for pneumococcal 20-valent conjugate vaccination 07/25/2023   Osteoporosis screening 07/25/2023   History of colonic polyps 10/13/2022   Abnormal finding  on GI tract imaging 08/18/2022   Depression 08/05/2022   Rectal adenoma 03/01/2022   Grade III internal hemorrhoids 03/01/2022   Mixed hyperlipidemia 09/30/2021   Type 2 diabetes mellitus with hyperlipidemia (HCC) 09/30/2021   Diabetic neuropathy (HCC) 09/29/2021   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   Past Medical History:  Diagnosis Date   Diabetes (HCC) 09/2021   Hip pain 2021    Hyperlipidemia     Family History  Problem Relation Age of Onset   Colon polyps Mother 56   Colon cancer Mother 67   Hypercholesterolemia Father    Diabetes Brother    CVA Brother    CVA Sibling    Crohn's disease Neg Hx    Esophageal cancer Neg Hx    Rectal cancer Neg Hx    Stomach cancer Neg Hx    Ulcerative colitis Neg Hx    Breast cancer Neg Hx    BRCA 1/2 Neg Hx     Past Surgical History:  Procedure Laterality Date   BREAST REDUCTION SURGERY Bilateral    1990s   CHOLECYSTECTOMY     COLONOSCOPY WITH PROPOFOL  N/A 03/01/2022   Procedure: COLONOSCOPY WITH PROPOFOL ;  Surgeon: San Sandor GAILS, DO;  Location: [facility];  Service: Gastroenterology;  Laterality: N/A;   ENDOSCOPIC MUCOSAL RESECTION N/A 03/01/2022   Procedure: ENDOSCOPIC MUCOSAL RESECTION;  Surgeon: San Sandor GAILS, DO;  Location: [facility];  Service: Gastroenterology;  Laterality: N/A;   FLEXIBLE SIGMOIDOSCOPY N/A 10/13/2022   Procedure: FLEXIBLE SIGMOIDOSCOPY;  Surgeon: San Sandor GAILS, DO;  Location: [facility];  Service: Gastroenterology;  Laterality: N/A;   SCLEROTHERAPY  03/01/2022   Procedure: SCLEROTHERAPY;  Surgeon: San Sandor GAILS, DO;  Location: [facility];  Service: Gastroenterology;;   Social History   Occupational History   Not on file  Tobacco Use   Smoking status: Former    Types: Cigarettes    Passive exposure: Never   Smokeless tobacco: Never  Vaping Use   Vaping status: Never Used  Substance and Sexual Activity   Alcohol use: Never   Drug use: Never   Sexual activity: Not on file

## 2023-09-26 ENCOUNTER — Other Ambulatory Visit: Payer: Self-pay | Admitting: Student

## 2023-09-26 DIAGNOSIS — G6289 Other specified polyneuropathies: Secondary | ICD-10-CM

## 2023-09-29 ENCOUNTER — Emergency Department (HOSPITAL_COMMUNITY)
Admission: EM | Admit: 2023-09-29 | Discharge: 2023-09-29 | Disposition: A | Discharge status: Home / Self Care | Attending: Emergency Medicine | Admitting: Emergency Medicine

## 2023-09-29 ENCOUNTER — Emergency Department (HOSPITAL_COMMUNITY)

## 2023-09-29 ENCOUNTER — Other Ambulatory Visit: Payer: Self-pay

## 2023-09-29 ENCOUNTER — Ambulatory Visit: Payer: Self-pay | Admitting: *Deleted

## 2023-09-29 DIAGNOSIS — E119 Type 2 diabetes mellitus without complications: Secondary | ICD-10-CM | POA: Diagnosis not present

## 2023-09-29 DIAGNOSIS — N200 Calculus of kidney: Secondary | ICD-10-CM | POA: Diagnosis not present

## 2023-09-29 DIAGNOSIS — Z7984 Long term (current) use of oral hypoglycemic drugs: Secondary | ICD-10-CM | POA: Insufficient documentation

## 2023-09-29 DIAGNOSIS — Z794 Long term (current) use of insulin: Secondary | ICD-10-CM | POA: Diagnosis not present

## 2023-09-29 DIAGNOSIS — R1031 Right lower quadrant pain: Secondary | ICD-10-CM | POA: Diagnosis present

## 2023-09-29 LAB — CBC WITH DIFFERENTIAL/PLATELET
Abs Immature Granulocytes: 0.06 K/uL (ref 0.00–0.07)
Basophils Absolute: 0 K/uL (ref 0.0–0.1)
Basophils Relative: 0 %
Eosinophils Absolute: 0 K/uL (ref 0.0–0.5)
Eosinophils Relative: 0 %
HCT: 41.2 % (ref 36.0–46.0)
Hemoglobin: 13.3 g/dL (ref 12.0–15.0)
Immature Granulocytes: 0 %
Lymphocytes Relative: 11 %
Lymphs Abs: 1.5 K/uL (ref 0.7–4.0)
MCH: 28.5 pg (ref 26.0–34.0)
MCHC: 32.3 g/dL (ref 30.0–36.0)
MCV: 88.4 fL (ref 80.0–100.0)
Monocytes Absolute: 0.7 K/uL (ref 0.1–1.0)
Monocytes Relative: 5 %
Neutro Abs: 11.1 K/uL — ABNORMAL HIGH (ref 1.7–7.7)
Neutrophils Relative %: 84 %
Platelets: 321 K/uL (ref 150–400)
RBC: 4.66 MIL/uL (ref 3.87–5.11)
RDW: 13.1 % (ref 11.5–15.5)
WBC: 13.4 K/uL — ABNORMAL HIGH (ref 4.0–10.5)
nRBC: 0 % (ref 0.0–0.2)

## 2023-09-29 LAB — URINALYSIS, ROUTINE W REFLEX MICROSCOPIC
Bilirubin Urine: NEGATIVE
Glucose, UA: NEGATIVE mg/dL
Ketones, ur: 40 mg/dL — AB
Leukocytes,Ua: NEGATIVE
Nitrite: NEGATIVE
Specific Gravity, Urine: 1.015 (ref 1.005–1.030)
pH: 7 (ref 5.0–8.0)

## 2023-09-29 LAB — COMPREHENSIVE METABOLIC PANEL WITH GFR
ALT: 26 U/L (ref 0–44)
AST: 28 U/L (ref 15–41)
Albumin: 4.3 g/dL (ref 3.5–5.0)
Alkaline Phosphatase: 73 U/L (ref 38–126)
Anion gap: 17 — ABNORMAL HIGH (ref 5–15)
BUN: 26 mg/dL — ABNORMAL HIGH (ref 8–23)
CO2: 26 mmol/L (ref 22–32)
Calcium: 9.8 mg/dL (ref 8.9–10.3)
Chloride: 97 mmol/L — ABNORMAL LOW (ref 98–111)
Creatinine, Ser: 0.81 mg/dL (ref 0.44–1.00)
GFR, Estimated: >60 mL/min (ref >60)
Glucose, Bld: 116 mg/dL — ABNORMAL HIGH (ref 70–99)
Potassium: 3.7 mmol/L (ref 3.5–5.1)
Sodium: 140 mmol/L (ref 135–145)
Total Bilirubin: 0.9 mg/dL (ref 0.0–1.2)
Total Protein: 7.5 g/dL (ref 6.5–8.1)

## 2023-09-29 LAB — URINALYSIS, MICROSCOPIC (REFLEX): RBC / HPF: >50 RBC/hpf (ref 0–5)

## 2023-09-29 LAB — LIPASE, BLOOD: Lipase: 49 U/L (ref 11–51)

## 2023-09-29 MED ORDER — ONDANSETRON HCL 4 MG/2ML IJ SOLN
4.0000 mg | Freq: Once | INTRAMUSCULAR | Status: AC
  Administered 2023-09-29: 4 mg via INTRAVENOUS
  Filled 2023-09-29: qty 2

## 2023-09-29 MED ORDER — TAMSULOSIN HCL 0.4 MG PO CAPS: 0.4000 mg | ORAL_CAPSULE | Freq: Every day | ORAL | 0 refills | Status: DC

## 2023-09-29 MED ORDER — HYDROMORPHONE HCL 1 MG/ML IJ SOLN
0.5000 mg | Freq: Once | INTRAMUSCULAR | Status: AC
  Administered 2023-09-29: 0.5 mg via INTRAVENOUS
  Filled 2023-09-29: qty 1

## 2023-09-29 MED ORDER — KETOROLAC TROMETHAMINE 15 MG/ML IJ SOLN
15.0000 mg | Freq: Once | INTRAMUSCULAR | Status: AC
  Administered 2023-09-29: 15 mg via INTRAVENOUS
  Filled 2023-09-29: qty 1

## 2023-09-29 MED ORDER — MORPHINE SULFATE (PF) 4 MG/ML IV SOLN
4.0000 mg | Freq: Once | INTRAVENOUS | Status: AC
  Administered 2023-09-29: 4 mg via INTRAVENOUS
  Filled 2023-09-29: qty 1

## 2023-09-29 MED ORDER — ONDANSETRON 4 MG PO TBDP: 4.0000 mg | ORAL_TABLET | Freq: Three times a day (TID) | ORAL | 0 refills | Status: DC | PRN

## 2023-09-29 MED ORDER — SODIUM CHLORIDE 0.9 % IV BOLUS
1000.0000 mL | Freq: Once | INTRAVENOUS | Status: AC
  Administered 2023-09-29: 1000 mL via INTRAVENOUS

## 2023-09-29 MED ORDER — IOHEXOL 300 MG/ML  SOLN
100.0000 mL | Freq: Once | INTRAMUSCULAR | Status: AC | PRN
Start: 2023-09-29 — End: 2023-09-29
  Administered 2023-09-29: 100 mL via INTRAVENOUS

## 2023-09-29 MED ORDER — OXYCODONE-ACETAMINOPHEN 5-325 MG PO TABS: 1.0000 | ORAL_TABLET | Freq: Four times a day (QID) | ORAL | 0 refills | Status: DC | PRN

## 2023-09-29 NOTE — ED Notes (Addendum)
 Pt reminded of urine sample & says she is still unable to go at this time because she hasn't been able to drink anything for the past 2-3 days.

## 2023-09-29 NOTE — ED Notes (Signed)
 Pt completed urine sample; NT sent sample down to the lab.

## 2023-09-29 NOTE — Discharge Instructions (Signed)
 As we discussed, you were seen in the emergency department and found to have a kidney stone.  Please take ibuprofen as needed for pain.  We are sending you home with multiple medications to assist with passing the stone and for residual pain/nausea:  -Flomax-this is a medication to help pass the stone, it allows urine to exit the body more freely.  Please take this once daily with a meal.  -Percocet-this is a narcotic/controlled substance medication that has potential addicting qualities.  We recommend that you take 1-2 tablets every 6 hours as needed for severe pain.  Do not drive or operate heavy machinery when taking this medicine as it can be sedating. Do not drink alcohol or take other sedating medications when taking this medicine for safety reasons.  Keep this out of reach of small children.  Please be aware this medicine has Tylenol in it (325 mg/tab) do not exceed the maximum dose of Tylenol in a day per over the counter recommendations should you decide to supplement with Tylenol over the counter.   -Zofran-this is an antinausea medication, you may take this every 8 hours as needed for nausea and vomiting, please allow the tablet to dissolve underneath of your tongue.   We have prescribed you new medication(s) today. Discuss the medications prescribed today with your pharmacist as they can have adverse effects and interactions with your other medicines including over the counter and prescribed medications. Seek medical evaluation if you start to experience new or abnormal symptoms after taking one of these medicines, seek care immediately if you start to experience difficulty breathing, feeling of your throat closing, facial swelling, or rash as these could be indications of a more serious allergic reaction  Please follow-up with the urology group provided in your discharge instructions within 3 to 5 days.  Return to the ER for new or worsening symptoms including but not limited to worsening  pain not controlled by these medicines, inability to keep fluids down, fever, or any other concerns that you may have.

## 2023-09-29 NOTE — ED Provider Notes (Signed)
 [place] [facility] DEPARTMENT AT [facility] Provider Note   CSN: [id number] Arrival date & time: 09/29/23  1116     Patient presents with: Abdominal Pain and Vomiting   Joann Campos is a 68 y.o. female.   Patient with history of diabetes, hyperlipidemia presents today with complaints of abdominal pain.  She states that her pain began yesterday initially but resolved without any intervention.  Pain returned today with associated nausea and vomiting.  Denies history of similar pain previously.  Denies any urinary symptoms.  No diarrhea, is having regular bowel movements.  Pain is in the right lower quadrant and into her right side area.  The history is provided by the patient. No language interpreter was used.  Abdominal Pain Associated symptoms: nausea and vomiting        Prior to Admission medications   Medication Sig Start Date End Date Taking? Authorizing Provider  Cyanocobalamin (VITAMIN B 12 PO) Take 1 tablet by mouth daily. One tablet    [provider]  FLUoxetine  (PROZAC ) 10 MG capsule TAKE 1 CAPSULE BY MOUTH DAILY 12/07/22   Tobie Gaines, DO  gabapentin  (NEURONTIN ) 300 MG capsule TAKE 2 CAPSULES BY MOUTH AT  BEDTIME 09/27/23   Renne Homans, MD  glucose blood (CONTOUR NEXT TEST) test strip Use as instructed 09/30/22   Elicia Sharper, DO  insulin  glargine (LANTUS  SOLOSTAR) 100 UNIT/ML Solostar Pen Inject 10 Units into the skin at bedtime. 06/23/23   Tobie Gaines, DO  Insulin  Pen Needle (PEN NEEDLES) 32G X 4 MM MISC 1 each by Does not apply route daily. 09/30/22   Zheng, Michael, DO  metFORMIN  (GLUCOPHAGE -XR) 500 MG 24 hr tablet TAKE 2 TABLETS BY MOUTH TWICE  DAILY WITH A MEAL 07/13/23   Tobie Gaines, DO  Microlet Lancets MISC TEST ONCE DAILY 07/21/23   Tobie Gaines, DO  Multiple Vitamins-Minerals (MULTIVITAMIN ADULTS PO) Take 1 tablet by mouth daily. Women over 50    [provider]  rosuvastatin  (CRESTOR ) 20 MG tablet TAKE 1 TABLET BY MOUTH DAILY  12/07/22   Tobie Gaines, DO    Allergies: Patient has no known allergies.    Review of Systems  Gastrointestinal:  Positive for abdominal pain, nausea and vomiting.  All other systems reviewed and are negative.   Updated Vital Signs BP (!) 146/100 (BP Location: Right Arm)   Pulse (!) 107   Temp 98 F (36.7 C) (Oral)   Resp 16   Ht 5' 5 (1.651 m)   Wt 62.8 kg   SpO2 97%   BMI 23.04 kg/m   Physical Exam Vitals and nursing note reviewed.  Constitutional:      General: She is not in acute distress.    Appearance: Normal appearance. She is normal weight. She is not ill-appearing, toxic-appearing or diaphoretic.  HENT:     Head: Normocephalic and atraumatic.  Cardiovascular:     Rate and Rhythm: Normal rate.  Pulmonary:     Effort: Pulmonary effort is normal. No respiratory distress.  Abdominal:     General: Abdomen is flat.     Palpations: Abdomen is soft.     Tenderness: There is abdominal tenderness in the right lower quadrant.  Musculoskeletal:        General: Normal range of motion.     Cervical back: Normal range of motion.  Skin:    General: Skin is warm and dry.  Neurological:     General: No focal deficit present.     Mental Status: She  is alert.  Psychiatric:        Mood and Affect: Mood normal.        Behavior: Behavior normal.     (all labs ordered are listed, but only abnormal results are displayed) Labs Reviewed  COMPREHENSIVE METABOLIC PANEL WITH GFR  LIPASE, BLOOD  CBC WITH DIFFERENTIAL/PLATELET  URINALYSIS, ROUTINE W REFLEX MICROSCOPIC    EKG: None  Radiology: No results found.   Procedures   Medications Ordered in the ED  morphine  (PF) 4 MG/ML injection 4 mg (has no administration in time range)  ondansetron  (ZOFRAN ) injection 4 mg (has no administration in time range)  sodium chloride  0.9 % bolus 1,000 mL (has no administration in time range)                                    Medical Decision Making Amount and/or Complexity of  Data Reviewed Labs: ordered. Radiology: ordered.  Risk Prescription drug management.   This patient is a 68 y.o. female who presents to the ED for concern of abdominal pain, nausea, vomiting, this involves an extensive number of treatment options, and is a complaint that carries with it a high risk of complications and morbidity. The emergent differential diagnosis prior to evaluation includes, but is not limited to,  The differential diagnosis for generalized abdominal pain includes, but is not limited to AAA, gastroenteritis, appendicitis, Bowel obstruction, Bowel perforation. Gastroparesis, DKA, Hernia, Inflammatory bowel disease, mesenteric ischemia, pancreatitis, peritonitis SBP, volvulus.  This is not an exhaustive differential.   Past Medical History / Co-morbidities / Social History:  has a past medical history of Diabetes (HCC) (09/2021), Hip pain (2021), and Hyperlipidemia.  Additional history: Chart reviewed.  Physical Exam: Physical exam performed. The pertinent findings include: RLQ TTP  Lab Tests: I ordered, and personally interpreted labs.  The pertinent results include:  WBC 13.4, BUN 26, anion gap 17. UA noninfectious   Imaging Studies: I ordered imaging studies including CT abdomen pelvis. I independently visualized and interpreted imaging which showed   1. 5 mm calculus in the proximal right ureter with mild obstructive uropathy. 2. Sigmoid colonic diverticulosis without evidence of acute diverticulitis. 3.  Aortic Atherosclerosis  I agree with the radiologist interpretation.   Medications: I ordered medication including toradol , dilaudid , morphine , zofran , fluids  for pain, nausea/vomiting, dehydration. Reevaluation of the patient after these medicines showed that the patient improved. I have reviewed the patients home medicines and have made adjustments as needed.   Disposition: After consideration of the diagnostic results and the patients response to  treatment, I feel that emergency department workup does not suggest an emergent condition requiring admission or immediate intervention beyond what has been performed at this time. The plan is: Discharge with Percocet, Zofran , and Flomax  with urology follow-up for management of her kidney stone.  PDMP reviewed. Patient advised not to drive or operate heavy machinery while taking this medication.  Upon reassessment after above interventions, she is feeling better and ready to go home.  Her UA is noninfectious. Evaluation and diagnostic testing in the emergency department does not suggest an emergent condition requiring admission or immediate intervention beyond what has been performed at this time.  Plan for discharge with close PCP follow-up.  Patient is understanding and amenable with plan, educated on red flag symptoms that would prompt immediate return.  Patient discharged in stable condition.  Final diagnoses:  Kidney stone    ED  Discharge Orders          Ordered    tamsulosin  (FLOMAX ) 0.4 MG CAPS capsule  Daily        09/29/23 1535    ondansetron  (ZOFRAN -ODT) 4 MG disintegrating tablet  Every 8 hours PRN        09/29/23 1535    oxyCODONE -acetaminophen  (PERCOCET/ROXICET) 5-325 MG tablet  Every 6 hours PRN        09/29/23 1535          An After Visit Summary was printed and given to the patient.      Nora Lauraine DELENA DEVONNA 09/29/23 1537    Suzette Pac, MD 09/30/23 [phone]

## 2023-09-29 NOTE — Telephone Encounter (Signed)
 FYI Only or Action Required?: FYI only for provider.  Patient was last seen in primary care on 07/25/2023 by Tobie Gaines, DO.  Called Nurse Triage reporting Emesis.  Symptoms began yesterday.  Interventions attempted: Rest, hydration, or home remedies.  Symptoms are: gradually worsening.  Triage Disposition: Go to ED Now (or PCP Triage)  Patient/caregiver understands and will follow disposition?: Yes- Patient got upset no office appointment- states she is going to ED Reason for Disposition  [1] MODERATE to SEVERE vomiting (e.g., 3 or more times/day) AND [2] age > 60 years  Answer Assessment - Initial Assessment Questions 1. VOMITING SEVERITY: How many times have you vomited in the past 24 hours?      3 times 2. ONSET: When did the vomiting begin?      Yesterday am 3. FLUIDS: What fluids or food have you vomited up today? Have you been able to keep any fluids down?     Has not eaten today 4. ABDOMEN PAIN: Are your having any abdomen pain? If Yes : How bad is it and what does it feel like? (e.g., crampy, dull, intermittent, constant)      Lower R side, sharp, hurting , comes and goes- lasting hours 5. DIARRHEA: Is there any diarrhea? If Yes, ask: How many times today?      no 6. CONTACTS: Is there anyone else in the family with the same symptoms?      no 7. CAUSE: What do you think is causing your vomiting?     no 8. HYDRATION STATUS: Any signs of dehydration? (e.g., dry mouth [not only dry lips], too weak to stand) When did you last urinate?     No, urinating normal 9. OTHER SYMPTOMS: Do you have any other symptoms? (e.g., fever, headache, vertigo, vomiting blood or coffee grounds, recent head injury)     no  Protocols used: Vomiting-A-AH   Copied from CRM [id number]. Topic: Clinical - Red Word Triage >> Sep 29, 2023  8:47 AM Alfonso ORN wrote: Red Word that prompted transfer to Nurse Triage:  worsen  pain rate 5 or 6 on right side and uncontrolling   vomiting   patient callback [phone]   ----------------------------------------------------------------------- From previous Reason for Contact - Scheduling: Patient/patient representative is calling to schedule an appointment. Refer to attachments for appointment information.

## 2023-09-29 NOTE — ED Triage Notes (Signed)
 Pt arrived reporting  RLQ pain x2 days with nausea and vomiting. No changes to bowel or bladder habits. Denies any other symptoms.

## 2023-10-03 ENCOUNTER — Encounter (HOSPITAL_COMMUNITY): Payer: Self-pay | Admitting: Emergency Medicine

## 2023-10-03 ENCOUNTER — Other Ambulatory Visit: Payer: Self-pay

## 2023-10-03 ENCOUNTER — Emergency Department (HOSPITAL_COMMUNITY)
Admission: EM | Admit: 2023-10-03 | Discharge: 2023-10-04 | Disposition: A | Discharge status: Home / Self Care | Attending: Emergency Medicine | Admitting: Emergency Medicine

## 2023-10-03 DIAGNOSIS — N201 Calculus of ureter: Secondary | ICD-10-CM

## 2023-10-03 DIAGNOSIS — E1165 Type 2 diabetes mellitus with hyperglycemia: Secondary | ICD-10-CM | POA: Diagnosis not present

## 2023-10-03 DIAGNOSIS — N132 Hydronephrosis with renal and ureteral calculous obstruction: Secondary | ICD-10-CM | POA: Diagnosis not present

## 2023-10-03 DIAGNOSIS — D649 Anemia, unspecified: Secondary | ICD-10-CM | POA: Diagnosis not present

## 2023-10-03 DIAGNOSIS — E1140 Type 2 diabetes mellitus with diabetic neuropathy, unspecified: Secondary | ICD-10-CM | POA: Insufficient documentation

## 2023-10-03 DIAGNOSIS — Z794 Long term (current) use of insulin: Secondary | ICD-10-CM | POA: Diagnosis not present

## 2023-10-03 DIAGNOSIS — Z7984 Long term (current) use of oral hypoglycemic drugs: Secondary | ICD-10-CM | POA: Insufficient documentation

## 2023-10-03 DIAGNOSIS — R109 Unspecified abdominal pain: Secondary | ICD-10-CM | POA: Diagnosis present

## 2023-10-03 NOTE — ED Triage Notes (Signed)
 Pt reports right sided flank pain w/ n/v. Was dx with kidney stone on Friday. Reports meds at home haven't been helping & that she has been vomiting all night.

## 2023-10-04 ENCOUNTER — Emergency Department (HOSPITAL_COMMUNITY)

## 2023-10-04 ENCOUNTER — Ambulatory Visit: Admitting: Urology

## 2023-10-04 LAB — BLOOD GAS, VENOUS
Acid-Base Excess: 5.2 mmol/L — ABNORMAL HIGH (ref 0.0–2.0)
Bicarbonate: 31.5 mmol/L — ABNORMAL HIGH (ref 20.0–28.0)
O2 Saturation: 54.8 %
Patient temperature: 37
pCO2, Ven: 52 mmHg (ref 44–60)
pH, Ven: 7.39 (ref 7.25–7.43)
pO2, Ven: <31 mmHg — CL (ref 32–45)

## 2023-10-04 LAB — COMPREHENSIVE METABOLIC PANEL WITH GFR
ALT: 15 U/L (ref 0–44)
AST: 19 U/L (ref 15–41)
Albumin: 3.3 g/dL — ABNORMAL LOW (ref 3.5–5.0)
Alkaline Phosphatase: 55 U/L (ref 38–126)
Anion gap: 9 (ref 5–15)
BUN: 24 mg/dL — ABNORMAL HIGH (ref 8–23)
CO2: 28 mmol/L (ref 22–32)
Calcium: 8.6 mg/dL — ABNORMAL LOW (ref 8.9–10.3)
Chloride: 99 mmol/L (ref 98–111)
Creatinine, Ser: 0.97 mg/dL (ref 0.44–1.00)
GFR, Estimated: >60 mL/min (ref >60)
Glucose, Bld: 159 mg/dL — ABNORMAL HIGH (ref 70–99)
Potassium: 3.5 mmol/L (ref 3.5–5.1)
Sodium: 136 mmol/L (ref 135–145)
Total Bilirubin: 0.5 mg/dL (ref 0.0–1.2)
Total Protein: 6.2 g/dL — ABNORMAL LOW (ref 6.5–8.1)

## 2023-10-04 LAB — URINALYSIS, W/ REFLEX TO CULTURE (INFECTION SUSPECTED)
Bacteria, UA: NONE SEEN
Bilirubin Urine: NEGATIVE
Glucose, UA: NEGATIVE mg/dL
Ketones, ur: 20 mg/dL — AB
Nitrite: NEGATIVE
Protein, ur: 100 mg/dL — AB
RBC / HPF: >50 RBC/hpf (ref 0–5)
Specific Gravity, Urine: 1.024 (ref 1.005–1.030)
pH: 7 (ref 5.0–8.0)

## 2023-10-04 LAB — CBC WITH DIFFERENTIAL/PLATELET
Abs Immature Granulocytes: 0.04 K/uL (ref 0.00–0.07)
Basophils Absolute: 0 K/uL (ref 0.0–0.1)
Basophils Relative: 0 %
Eosinophils Absolute: 0 K/uL (ref 0.0–0.5)
Eosinophils Relative: 0 %
HCT: 33.2 % — ABNORMAL LOW (ref 36.0–46.0)
Hemoglobin: 10.8 g/dL — ABNORMAL LOW (ref 12.0–15.0)
Immature Granulocytes: 0 %
Lymphocytes Relative: 11 %
Lymphs Abs: 1 K/uL (ref 0.7–4.0)
MCH: 28.8 pg (ref 26.0–34.0)
MCHC: 32.5 g/dL (ref 30.0–36.0)
MCV: 88.5 fL (ref 80.0–100.0)
Monocytes Absolute: 0.8 K/uL (ref 0.1–1.0)
Monocytes Relative: 9 %
Neutro Abs: 7.1 K/uL (ref 1.7–7.7)
Neutrophils Relative %: 80 %
Platelets: 254 K/uL (ref 150–400)
RBC: 3.75 MIL/uL — ABNORMAL LOW (ref 3.87–5.11)
RDW: 13 % (ref 11.5–15.5)
WBC: 8.9 K/uL (ref 4.0–10.5)
nRBC: 0 % (ref 0.0–0.2)

## 2023-10-04 LAB — CBG MONITORING, ED: Glucose-Capillary: 156 mg/dL — ABNORMAL HIGH (ref 70–99)

## 2023-10-04 LAB — LIPASE, BLOOD: Lipase: 41 U/L (ref 11–51)

## 2023-10-04 MED ORDER — KETOROLAC TROMETHAMINE 15 MG/ML IJ SOLN
15.0000 mg | Freq: Once | INTRAMUSCULAR | Status: AC
  Administered 2023-10-04: 15 mg via INTRAVENOUS
  Filled 2023-10-04: qty 1

## 2023-10-04 MED ORDER — SODIUM CHLORIDE 0.9 % IV BOLUS
1000.0000 mL | Freq: Once | INTRAVENOUS | Status: AC
  Administered 2023-10-04: 1000 mL via INTRAVENOUS

## 2023-10-04 MED ORDER — ONDANSETRON HCL 4 MG/2ML IJ SOLN
4.0000 mg | Freq: Once | INTRAMUSCULAR | Status: AC
  Administered 2023-10-04: 4 mg via INTRAVENOUS
  Filled 2023-10-04: qty 2

## 2023-10-04 NOTE — ED Provider Notes (Signed)
 [facility] EMERGENCY DEPARTMENT AT [facility] Provider Note  CSN: [id number] Arrival date & time: 10/03/23 2307  Chief Complaint(s) Flank Pain  HPI Joann Campos is a 68 y.o. female with past medical history listed below including known 5 mm right ureter stone here for recurrent flank pain.  Patient reports that she has been controlling her pain with the prescribed medications however this evening her pain creasingly worse and associated with nausea and nonbloody nonbilious emesis and decreased oral tolerance.  Pain medicine was not controlling the pain.  The history is provided by the patient.    Past Medical History Past Medical History:  Diagnosis Date   Diabetes (HCC) 09/2021   Hip pain 2021   Hyperlipidemia    Patient Active Problem List   Diagnosis Date Noted   Need for pneumococcal 20-valent conjugate vaccination 07/25/2023   Osteoporosis screening 07/25/2023   History of colonic polyps 10/13/2022   Abnormal finding on GI tract imaging 08/18/2022   Depression 08/05/2022   Rectal adenoma 03/01/2022   Grade III internal hemorrhoids 03/01/2022   Mixed hyperlipidemia 09/30/2021   Type 2 diabetes mellitus with hyperlipidemia (HCC) 09/30/2021   Diabetic neuropathy (HCC) 09/29/2021   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 Authorizing Provider  Cyanocobalamin (VITAMIN B 12 PO) Take 1 tablet by mouth daily. One tablet    [provider]  FLUoxetine  (PROZAC ) 10 MG capsule TAKE 1 CAPSULE BY MOUTH DAILY 12/07/22   Tobie Gaines, DO  gabapentin  (NEURONTIN ) 300 MG capsule TAKE 2 CAPSULES BY MOUTH AT  BEDTIME 09/27/23   Renne Homans, MD  glucose blood (CONTOUR NEXT TEST) test strip Use as instructed 09/30/22   Zheng, Michael, DO  insulin  glargine (LANTUS  SOLOSTAR) 100 UNIT/ML Solostar Pen Inject 10 Units into the skin at bedtime. 06/23/23   Tobie Gaines, DO  Insulin  Pen Needle  (PEN NEEDLES) 32G X 4 MM MISC 1 each by Does not apply route daily. 09/30/22   Zheng, Michael, DO  metFORMIN  (GLUCOPHAGE -XR) 500 MG 24 hr tablet TAKE 2 TABLETS BY MOUTH TWICE  DAILY WITH A MEAL 07/13/23   Tobie Gaines, DO  Microlet Lancets MISC TEST ONCE DAILY 07/21/23   Tobie Gaines, DO  Multiple Vitamins-Minerals (MULTIVITAMIN ADULTS PO) Take 1 tablet by mouth daily. Women over 50    [provider]  ondansetron  (ZOFRAN -ODT) 4 MG disintegrating tablet Take 1 tablet (4 mg total) by mouth every 8 (eight) hours as needed. 09/29/23   Smoot, Lauraine LABOR, PA-C  oxyCODONE -acetaminophen  (PERCOCET/ROXICET) 5-325 MG tablet Take 1 tablet by mouth every 6 (six) hours as needed for severe pain (pain score 7-10). 09/29/23   Smoot, Lauraine LABOR, PA-C  rosuvastatin  (CRESTOR ) 20 MG tablet TAKE 1 TABLET BY MOUTH DAILY 12/07/22   Tobie Gaines, DO  tamsulosin  (FLOMAX ) 0.4 MG CAPS capsule Take 1 capsule (0.4 mg total) by mouth daily. 09/29/23   Smoot, Lauraine LABOR, PA-C  Allergies Patient has no known allergies.  Review of Systems Review of Systems As noted in HPI  Physical Exam Vital Signs  I have reviewed the triage vital signs BP 110/60   Pulse 74   Temp 98.5 F (36.9 C) (Oral)   Resp 14   SpO2 96%   Physical Exam Vitals reviewed.  Constitutional:      General: She is not in acute distress.    Appearance: She is well-developed. She is not diaphoretic.  HENT:     Head: Normocephalic and atraumatic.     Right Ear: External ear normal.     Left Ear: External ear normal.     Nose: Nose normal.  Eyes:     General: No scleral icterus.    Conjunctiva/sclera: Conjunctivae normal.  Neck:     Trachea: Phonation normal.  Cardiovascular:     Rate and Rhythm: Normal rate and regular rhythm.  Pulmonary:     Effort: Pulmonary effort is normal. No respiratory distress.     Breath sounds: No  stridor.  Abdominal:     General: There is no distension.     Tenderness: There is right CVA tenderness.  Musculoskeletal:        General: Normal range of motion.     Cervical back: Normal range of motion.  Neurological:     Mental Status: She is alert and oriented to person, place, and time.  Psychiatric:        Behavior: Behavior normal.     ED Results and Treatments Labs (all labs ordered are listed, but only abnormal results are displayed) Labs Reviewed  COMPREHENSIVE METABOLIC PANEL WITH GFR - Abnormal; Notable for the following components:      Result Value   Glucose, Bld 159 (*)    BUN 24 (*)    Calcium  8.6 (*)    Total Protein 6.2 (*)    Albumin 3.3 (*)    All other components within normal limits  CBC WITH DIFFERENTIAL/PLATELET - Abnormal; Notable for the following components:   RBC 3.75 (*)    Hemoglobin 10.8 (*)    HCT 33.2 (*)    All other components within normal limits  URINALYSIS, W/ REFLEX TO CULTURE (INFECTION SUSPECTED) - Abnormal; Notable for the following components:   Color, Urine AMBER (*)    APPearance CLOUDY (*)    Hgb urine dipstick LARGE (*)    Ketones, ur 20 (*)    Protein, ur 100 (*)    Leukocytes,Ua TRACE (*)    All other components within normal limits  BLOOD GAS, VENOUS - Abnormal; Notable for the following components:   pO2, Ven <31 (*)    Bicarbonate 31.5 (*)    Acid-Base Excess 5.2 (*)    All other components within normal limits  CBG MONITORING, ED - Abnormal; Notable for the following components:   Glucose-Capillary 156 (*)    All other components within normal limits  LIPASE, BLOOD  EKG  EKG Interpretation Date/Time:    Ventricular Rate:    PR Interval:    QRS Duration:    QT Interval:    QTC Calculation:   R Axis:      Text Interpretation:         Radiology CT RENAL STONE STUDY Result Date:  10/04/2023 CLINICAL DATA:  Right-sided flank pain for several days, known proximal right ureteral stone EXAM: CT ABDOMEN AND PELVIS WITHOUT CONTRAST TECHNIQUE: Multidetector CT imaging of the abdomen and pelvis was performed following the standard protocol without IV contrast. RADIATION DOSE REDUCTION: This exam was performed according to the departmental dose-optimization program which includes automated exposure control, adjustment of the mA and/or kV according to patient size and/or use of iterative reconstruction technique. COMPARISON:  09/29/2023 FINDINGS: Lower chest: No acute abnormality. Hepatobiliary: No focal liver abnormality is seen. Status post cholecystectomy. No biliary dilatation. Pancreas: Unremarkable. No pancreatic ductal dilatation or surrounding inflammatory changes. Spleen: Normal in size without focal abnormality. Adrenals/Urinary Tract: Adrenal glands are within normal limits. Left kidney is within normal limits without renal calculi or obstructive changes. Right kidney demonstrates progressive hydronephrosis when compared with the prior exam. This extends inferiorly to the previously seen ureteral stone. This has migrated somewhat distally now lying at the level of the L4-5 disc space. The more distal right ureter is within normal limits. The bladder is decompressed. Stomach/Bowel: The appendix is within normal limits. No obstructive or inflammatory changes of the colon are seen. Small bowel and stomach are within normal limits. Vascular/Lymphatic: Aortic atherosclerosis. No enlarged abdominal or pelvic lymph nodes. Reproductive: Uterus and bilateral adnexa are unremarkable. Other: No abdominal wall hernia or abnormality. No abdominopelvic ascites. Musculoskeletal: No acute or significant osseous findings. IMPRESSION: Interval migration of known right ureteral stone distally to the L4-5 level. Previously the calculus lie at the L3-4 interspace. Persistent and slightly increased  hydronephrosis is noted. Electronically Signed   By: Oneil Devonshire M.D.   On: 10/04/2023 02:09    Medications Ordered in ED Medications  sodium chloride  0.9 % bolus 1,000 mL (0 mLs Intravenous Stopped 10/04/23 0331)  ketorolac  (TORADOL ) 15 MG/ML injection 15 mg (15 mg Intravenous Given 10/04/23 0131)  ondansetron  (ZOFRAN ) injection 4 mg (4 mg Intravenous Given 10/04/23 0131)   Procedures Procedures  (including critical care time) Medical Decision Making / ED Course   Medical Decision Making Amount and/or Complexity of Data Reviewed Labs: ordered. Radiology: ordered.  Risk Prescription drug management.    Right flank pain Known ureteral stone Differential diagnosis considered.  CBC without leukocytosis.  Mild anemia.  Metabolic panel without significant electrolyte derangements or renal sufficiency.  No bili obstruction or pancreatitis. CT stone study notable for interval migration of the stone.  She has slightly worsening hydronephrosis. UA without evidence of infection.  Patient was provided with IV fluids, antiemetics and Toradol  which provided significant relief.  Patient able to tolerate p.o.     Final Clinical Impression(s) / ED Diagnoses Final diagnoses:  Ureterolithiasis   The patient appears reasonably screened and/or stabilized for discharge and I doubt any other medical condition or other [facility] requiring further screening, evaluation, or treatment in the ED at this time. I have discussed the findings, Dx and Tx plan with the patient/family who expressed understanding and agree(s) with the plan. Discharge instructions discussed at length. The patient/family was given strict return precautions who verbalized understanding of the instructions. No further questions at time of discharge.  Disposition: Discharge  Condition: Good  ED Discharge  Orders     None        Follow Up: [facility] [address] [place]   [phone] Call  to schedule an appointment for close follow up    This chart was dictated using voice recognition software.  Despite best efforts to proofread,  errors can occur which can change the documentation meaning.    Trine Raynell Moder, MD 10/04/23 0700

## 2023-10-04 NOTE — ED Notes (Signed)
 Pt tolerated po challenge w/ saltine crackers and water w/out any difficulties.No nausea or vomiting noted.

## 2023-10-05 ENCOUNTER — Ambulatory Visit: Admitting: Urology

## 2023-10-05 ENCOUNTER — Ambulatory Visit (HOSPITAL_BASED_OUTPATIENT_CLINIC_OR_DEPARTMENT_OTHER)
Admission: RE | Admit: 2023-10-05 | Discharge: 2023-10-05 | Disposition: A | Discharge status: Home / Self Care | Source: Ambulatory Visit | Attending: Urology | Admitting: Urology

## 2023-10-05 ENCOUNTER — Encounter: Payer: Self-pay | Admitting: Urology

## 2023-10-05 VITALS — BP 90/67 | HR 101 | Ht 65.0 in | Wt 130.0 lb

## 2023-10-05 DIAGNOSIS — N201 Calculus of ureter: Secondary | ICD-10-CM | POA: Diagnosis not present

## 2023-10-05 LAB — URINALYSIS, ROUTINE W REFLEX MICROSCOPIC
Bilirubin, UA: NEGATIVE
Glucose, UA: NEGATIVE
Nitrite, UA: NEGATIVE
Specific Gravity, UA: 1.02 (ref 1.005–1.030)
Urobilinogen, Ur: 0.2 mg/dL (ref 0.2–1.0)
pH, UA: 6 (ref 5.0–7.5)

## 2023-10-05 LAB — MICROSCOPIC EXAMINATION: RBC, Urine: >30 /HPF — AB (ref 0–2)

## 2023-10-05 MED ORDER — OXYCODONE-ACETAMINOPHEN 5-325 MG PO TABS: 1.0000 | ORAL_TABLET | Freq: Four times a day (QID) | ORAL | 0 refills | Status: DC | PRN

## 2023-10-05 NOTE — Progress Notes (Signed)
 Assessment: 1. Ureteral calculus, right     Plan: I personally reviewed the patient's chart including provider notes, labs and imaging results. I personally reviewed the CT studies from 09/29/2023 and 10/04/2023.  There is a 5 x 2 mm calculus in the mid right ureter with associated hydronephrosis. KUB today. Diagnosis and treatment options for a ureteral calculus including spontaneous stone passage, shock wave lithotripsy, and ureteroscopic stone manipulation were discussed with Almarie Crouch in detail.  She has elected to attempt to pass the ureteral calculus. Strain urine, Pain medication as needed.  Rx provided., Anti-emetics as needed.  Rx provided, and Alpha blocker for medical expulsive therapy.  Off label use discussed.  Use and side effects reviewed.  Rx provided. Follow-up in 10 days with KUB. Return to office or ER for uncontrolled pain, vomiting, fever, or chills.   Addendum: KUB from today reviewed.  No obvious calcification is seen along the expected course of the right ureter.  Visualization is somewhat limited due to overlying bowel gas and stool.  There are number of vascular calcifications in the pelvis also making visualization difficult.  Chief Complaint:  Chief Complaint  Patient presents with   Nephrolithiasis    History of Present Illness:  Joann Campos is a 68 y.o. female who is seen in consultation from Tobie Gaines, DO for evaluation of a right ureteral calculus. She presented to the emergency room on 09/29/2023 with right sided flank pain. CT imaging showed a 5 mm calculus in the proximal right ureter with mild obstructive uropathy.  She was treated with pain medication and tamsulosin . She returned to the emergency room on 10/04/2023 with increased flank pain.  She also had associated nausea and vomiting. Repeat CT scan showed distal migration of the right ureteral calculus to the L4-5 disc space with right hydronephrosis. Her pain has been fairly  well-controlled the past 24 hours.  She reports it as a 2-3/10.  She is not having any further nausea or vomiting.  No fevers or chills.  No gross hematuria or dysuria. No prior history of kidney stones.  Past Medical History:  Past Medical History:  Diagnosis Date   Diabetes (HCC) 09/2021   Hip pain 2021   Hyperlipidemia     Past Surgical History:  Past Surgical History:  Procedure Laterality Date   BREAST REDUCTION SURGERY Bilateral    1990s   CHOLECYSTECTOMY     COLONOSCOPY WITH PROPOFOL  N/A 03/01/2022   Procedure: COLONOSCOPY WITH PROPOFOL ;  Surgeon: San Sandor GAILS, DO;  Location: [facility];  Service: Gastroenterology;  Laterality: N/A;   ENDOSCOPIC MUCOSAL RESECTION N/A 03/01/2022   Procedure: ENDOSCOPIC MUCOSAL RESECTION;  Surgeon: San Sandor GAILS, DO;  Location: [facility];  Service: Gastroenterology;  Laterality: N/A;   FLEXIBLE SIGMOIDOSCOPY N/A 10/13/2022   Procedure: FLEXIBLE SIGMOIDOSCOPY;  Surgeon: San Sandor GAILS, DO;  Location: [facility];  Service: Gastroenterology;  Laterality: N/A;   SCLEROTHERAPY  03/01/2022   Procedure: SCLEROTHERAPY;  Surgeon: San Sandor GAILS, DO;  Location: [facility];  Service: Gastroenterology;;    Allergies:  No Known Allergies  Family History:  Family History  Problem Relation Age of Onset   Colon polyps Mother 9   Colon cancer Mother 50   Hypercholesterolemia Father    Diabetes Brother    CVA Brother    CVA Sibling    Crohn's disease Neg Hx    Esophageal cancer Neg Hx    Rectal cancer Neg Hx    Stomach cancer Neg Hx  Ulcerative colitis Neg Hx    Breast cancer Neg Hx    BRCA 1/2 Neg Hx     Social History:  Social History   Tobacco Use   Smoking status: Former    Types: Cigarettes    Passive exposure: Never   Smokeless tobacco: Never  Vaping Use   Vaping status: Never Used  Substance Use Topics   Alcohol use: Never   Drug use: Never    Review of symptoms:  Constitutional:  Negative for  unexplained weight loss, night sweats, fever, chills ENT:  Negative for nose bleeds, sinus pain, painful swallowing CV:  Negative for chest pain, shortness of breath, exercise intolerance, palpitations, loss of consciousness Resp:  Negative for cough, wheezing, shortness of breath GI:  Negative for diarrhea, bloody stools GU:  Positives noted in HPI; otherwise negative for gross hematuria, dysuria, urinary incontinence Neuro:  Negative for seizures, poor balance, limb weakness, slurred speech Psych:  Negative for lack of energy, depression, anxiety Endocrine:  Negative for polydipsia, polyuria, symptoms of hypoglycemia (dizziness, hunger, sweating) Hematologic:  Negative for anemia, purpura, petechia, prolonged or excessive bleeding, use of anticoagulants  Allergic:  Negative for difficulty breathing or choking as a result of exposure to anything; no shellfish allergy; no allergic response (rash/itch) to materials, foods  Physical exam: BP 90/67   Pulse (!) 101   Ht 5' 5 (1.651 m)   Wt 130 lb (59 kg)   BMI 21.63 kg/m  GENERAL APPEARANCE:  Well appearing, well developed, well nourished, NAD HEENT: Atraumatic, Normocephalic, oropharynx clear. NECK: Supple without lymphadenopathy or thyromegaly. LUNGS: Clear to auscultation bilaterally. HEART: Regular Rate and Rhythm without murmurs, gallops, or rubs. ABDOMEN: Soft, non-tender, No Masses. EXTREMITIES: Moves all extremities well.  Without clubbing, cyanosis, or edema. NEUROLOGIC:  Alert and oriented x 3, normal gait, CN II-XII grossly intact.  MENTAL STATUS:  Appropriate. BACK:  Non-tender to palpation.  No CVAT SKIN:  Warm, dry and intact.    Results: Results for orders placed or performed in visit on 10/05/23 (from the past 24 hours)  Urinalysis, Routine w reflex microscopic     Status: Abnormal   Collection Time: 10/05/23 12:00 AM  Result Value Ref Range   Specific Gravity, UA 1.020 1.005 - 1.030   pH, UA 6.0 5.0 - 7.5   Color,  UA Yellow Yellow   Appearance Ur Cloudy (A) Clear   Leukocytes,UA Trace (A) Negative   Protein,UA 1+ (A) Negative/Trace   Glucose, UA Negative Negative   Ketones, UA Trace (A) Negative   RBC, UA 3+ (A) Negative   Bilirubin, UA Negative Negative   Urobilinogen, Ur 0.2 0.2 - 1.0 mg/dL   Nitrite, UA Negative Negative   Microscopic Examination See below:    Narrative   Performed at:  01 - [facility]  Urology [facility] [address], [place], [place]  [id number] Lab Director: Greig Chang MT, Phone:  [phone]  Microscopic Examination     Status: Abnormal   Collection Time: 10/05/23 12:00 AM   Urine  Result Value Ref Range   WBC, UA 0-5 0 - 5 /hpf   RBC, Urine >30 (A) 0 - 2 /hpf   Epithelial Cells (non renal) 0-10 0 - 10 /hpf   Mucus, UA Present (A) Not Estab.   Bacteria, UA Few None seen/Few   Narrative   Performed at:  01 - [facility]  Urology [facility] [address], [place], [place]  [id number] Lab Director: Greig Jamaica  MT, Phone:  [phone]

## 2023-10-06 ENCOUNTER — Other Ambulatory Visit: Payer: Self-pay | Admitting: Student

## 2023-10-06 DIAGNOSIS — E1169 Type 2 diabetes mellitus with other specified complication: Secondary | ICD-10-CM

## 2023-10-09 ENCOUNTER — Other Ambulatory Visit: Payer: Self-pay | Admitting: Student

## 2023-10-09 DIAGNOSIS — E1169 Type 2 diabetes mellitus with other specified complication: Secondary | ICD-10-CM

## 2023-10-09 DIAGNOSIS — E782 Mixed hyperlipidemia: Secondary | ICD-10-CM

## 2023-10-09 DIAGNOSIS — F32A Depression, unspecified: Secondary | ICD-10-CM

## 2023-10-10 ENCOUNTER — Other Ambulatory Visit: Payer: Self-pay | Admitting: Student

## 2023-10-10 DIAGNOSIS — E1169 Type 2 diabetes mellitus with other specified complication: Secondary | ICD-10-CM

## 2023-10-10 NOTE — Telephone Encounter (Signed)
 Medication sent to pharmacy

## 2023-10-10 NOTE — Telephone Encounter (Unsigned)
 Copied from CRM [id number]. Topic: Clinical - Medication Refill >> Oct 10, 2023 12:09 PM Laurier C wrote: Medication: Insulin  Pen Needle (BD PEN NEEDLE NANO 2ND GEN) 32G X 4 MM MISC  Has the patient contacted their pharmacy? No (Agent: If no, request that the patient contact the pharmacy for the refill. If patient does not wish to contact the pharmacy document the reason why and proceed with request.) (Agent: If yes, when and what did the pharmacy advise?)  This is the patient's preferred pharmacy:   [facility] [place] [place] - [address] [place] [address] Phone: [phone] Fax: [phone]  Is this the correct pharmacy for this prescription? Yes If no, delete pharmacy and type the correct one.   Has the prescription been filled recently? No  Is the patient out of the medication? No, she will be soon  Has the patient been seen for an appointment in the last year OR does the patient have an upcoming appointment? No  Can we respond through MyChart? Yes  Agent: Please be advised that Rx refills may take up to 3 business days. We ask that you follow-up with your pharmacy.

## 2023-10-17 ENCOUNTER — Other Ambulatory Visit: Payer: Self-pay

## 2023-10-17 DIAGNOSIS — N201 Calculus of ureter: Secondary | ICD-10-CM

## 2023-10-18 ENCOUNTER — Encounter: Payer: Self-pay | Admitting: Urology

## 2023-10-18 ENCOUNTER — Ambulatory Visit (HOSPITAL_BASED_OUTPATIENT_CLINIC_OR_DEPARTMENT_OTHER)
Admission: RE | Admit: 2023-10-18 | Discharge: 2023-10-18 | Disposition: A | Discharge status: Home / Self Care | Source: Ambulatory Visit | Attending: Urology | Admitting: Urology

## 2023-10-18 ENCOUNTER — Ambulatory Visit: Admitting: Urology

## 2023-10-18 VITALS — BP 87/62 | HR 106 | Ht 65.0 in | Wt 130.0 lb

## 2023-10-18 DIAGNOSIS — N201 Calculus of ureter: Secondary | ICD-10-CM | POA: Diagnosis not present

## 2023-10-18 LAB — URINALYSIS, ROUTINE W REFLEX MICROSCOPIC
Bilirubin, UA: NEGATIVE
Glucose, UA: NEGATIVE
Nitrite, UA: NEGATIVE
Protein,UA: NEGATIVE
RBC, UA: NEGATIVE
Specific Gravity, UA: 1.02 (ref 1.005–1.030)
Urobilinogen, Ur: 0.2 mg/dL (ref 0.2–1.0)
pH, UA: 5.5 (ref 5.0–7.5)

## 2023-10-18 LAB — MICROSCOPIC EXAMINATION

## 2023-10-18 NOTE — Progress Notes (Signed)
 Assessment: 1. Ureteral calculus, right     Plan: I personally reviewed the KUB study from today. She is currently asymptomatic.  KUB today does not show an obvious ureteral calcification. I discussed the possibility that she has passed the stone. Recommend continued observation for any return of symptoms. May discontinue tamsulosin . Return to office in 1 month.  Discussed further evaluation with repeat CT imaging at that time.  Chief Complaint:  Chief Complaint  Patient presents with   Nephrolithiasis    History of Present Illness:  Joann Campos is a 68 y.o. female who is seen for continued evaluation of a right ureteral calculus. She presented to the emergency room on 09/29/2023 with right sided flank pain. CT imaging showed a 5 mm calculus in the proximal right ureter with mild obstructive uropathy.  She was treated with pain medication and tamsulosin . She returned to the emergency room on 10/04/2023 with increased flank pain.  She also had associated nausea and vomiting. Repeat CT scan showed distal migration of the right ureteral calculus to the L4-5 disc space with right hydronephrosis. At her initial visit on 10/05/23, she reported that her pain had been fairly well-controlled.  She reported it as a 2-3/10.  She was not having any further nausea or vomiting.  No fevers or chills.  No gross hematuria or dysuria. No prior history of kidney stones. KUB from 10/05/2023 did not showed possible calculus overlying the right sacrum although visualization was difficult. She elected to attempt spontaneous passage of the stone.  She returns today for follow-up.  She has not had any flank pain since her last visit.  She is not aware of passing a stone and has been straining her urine on a fairly regular basis.  No dysuria or gross hematuria.  No fevers or chills.  Portions of the above documentation were copied from a prior visit for review purposes only.   Past Medical History:   Past Medical History:  Diagnosis Date   Diabetes (HCC) 09/2021   Hip pain 2021   Hyperlipidemia     Past Surgical History:  Past Surgical History:  Procedure Laterality Date   BREAST REDUCTION SURGERY Bilateral    1990s   CHOLECYSTECTOMY     COLONOSCOPY WITH PROPOFOL  N/A 03/01/2022   Procedure: COLONOSCOPY WITH PROPOFOL ;  Surgeon: San Sandor GAILS, DO;  Location: [facility];  Service: Gastroenterology;  Laterality: N/A;   ENDOSCOPIC MUCOSAL RESECTION N/A 03/01/2022   Procedure: ENDOSCOPIC MUCOSAL RESECTION;  Surgeon: San Sandor GAILS, DO;  Location: [facility];  Service: Gastroenterology;  Laterality: N/A;   FLEXIBLE SIGMOIDOSCOPY N/A 10/13/2022   Procedure: FLEXIBLE SIGMOIDOSCOPY;  Surgeon: San Sandor GAILS, DO;  Location: [facility];  Service: Gastroenterology;  Laterality: N/A;   SCLEROTHERAPY  03/01/2022   Procedure: SCLEROTHERAPY;  Surgeon: San Sandor GAILS, DO;  Location: [facility];  Service: Gastroenterology;;    Allergies:  No Known Allergies  Family History:  Family History  Problem Relation Age of Onset   Colon polyps Mother 70   Colon cancer Mother 69   Hypercholesterolemia Father    Diabetes Brother    CVA Brother    CVA Sibling    Crohn's disease Neg Hx    Esophageal cancer Neg Hx    Rectal cancer Neg Hx    Stomach cancer Neg Hx    Ulcerative colitis Neg Hx    Breast cancer Neg Hx    BRCA 1/2 Neg Hx     Social History:  Social History  Tobacco Use   Smoking status: Former    Types: Cigarettes    Passive exposure: Never   Smokeless tobacco: Never  Vaping Use   Vaping status: Never Used  Substance Use Topics   Alcohol use: Never   Drug use: Never    ROS: Constitutional:  Negative for fever, chills, weight loss CV: Negative for chest pain, previous MI, hypertension Respiratory:  Negative for shortness of breath, wheezing, sleep apnea, frequent cough GI:  Negative for nausea, vomiting, bloody stool, GERD  Physical exam: BP  (!) 87/62   Pulse (!) 106   Ht 5' 5 (1.651 m)   Wt 130 lb (59 kg)   BMI 21.63 kg/m  GENERAL APPEARANCE:  Well appearing, well developed, well nourished, NAD HEENT:  Atraumatic, normocephalic, oropharynx clear NECK:  Supple without lymphadenopathy or thyromegaly ABDOMEN:  Soft, non-tender, no masses EXTREMITIES:  Moves all extremities well, without clubbing, cyanosis, or edema NEUROLOGIC:  Alert and oriented x 3, normal gait, CN II-XII grossly intact MENTAL STATUS:  appropriate BACK:  Non-tender to palpation, No CVAT SKIN:  Warm, dry, and intact   Results: U/A: 0-5 WBCs, 0-2 RBCs  KUB from this morning reviewed.  The previously noted calcification overlying the right sacrum is not readily visible today.  I do not see any obvious calcifications in the right pelvis but visualization is limited due to overlying bowel gas and stool.

## 2023-11-21 ENCOUNTER — Ambulatory Visit: Admitting: Urology

## 2024-01-22 ENCOUNTER — Inpatient Hospital Stay (HOSPITAL_BASED_OUTPATIENT_CLINIC_OR_DEPARTMENT_OTHER): Admission: RE | Admit: 2024-01-22 | Source: Ambulatory Visit

## 2024-01-22 ENCOUNTER — Encounter: Payer: Self-pay | Admitting: Radiology

## 2024-02-27 ENCOUNTER — Encounter (HOSPITAL_COMMUNITY): Payer: Self-pay

## 2024-02-27 ENCOUNTER — Ambulatory Visit (HOSPITAL_COMMUNITY): Admission: EM | Admit: 2024-02-27 | Discharge: 2024-02-27 | Disposition: A | Discharge status: Home / Self Care

## 2024-02-27 ENCOUNTER — Telehealth (HOSPITAL_COMMUNITY): Payer: Self-pay | Admitting: *Deleted

## 2024-02-27 DIAGNOSIS — A084 Viral intestinal infection, unspecified: Secondary | ICD-10-CM | POA: Diagnosis not present

## 2024-02-27 DIAGNOSIS — R112 Nausea with vomiting, unspecified: Secondary | ICD-10-CM

## 2024-02-27 DIAGNOSIS — R000 Tachycardia, unspecified: Secondary | ICD-10-CM | POA: Diagnosis not present

## 2024-02-27 LAB — POCT URINE DIPSTICK
Glucose, UA: NEGATIVE mg/dL
Leukocytes, UA: NEGATIVE
Nitrite, UA: NEGATIVE
POC PROTEIN,UA: >=300 — AB
Spec Grav, UA: >=1.03 — AB (ref 1.010–1.025)
Urobilinogen, UA: 0.2 U/dL
pH, UA: 5.5 (ref 5.0–8.0)

## 2024-02-27 LAB — GLUCOSE, POCT (MANUAL RESULT ENTRY): POC Glucose: 183 mg/dL — AB (ref 70–99)

## 2024-02-27 MED ORDER — SODIUM CHLORIDE 0.9 % IV BOLUS
1000.0000 mL | Freq: Once | INTRAVENOUS | Status: AC
  Administered 2024-02-27: 1000 mL via INTRAVENOUS

## 2024-02-27 MED ORDER — ONDANSETRON 4 MG PO TBDP: 4.0000 mg | ORAL_TABLET | Freq: Three times a day (TID) | ORAL | 0 refills | Status: AC | PRN

## 2024-02-27 NOTE — Telephone Encounter (Signed)
 PT left her purse in room 3 . Pt 's Phone is in purse and This clinical research associate could hear phone ring when calling PT.  This  clinical research associate called Pt's spouse  and there has been no answer to calls. Pt's Purse is in drawer at Nurses Desk under Printer.

## 2024-02-27 NOTE — ED Triage Notes (Signed)
 Patient's husband states the patient has been vomiting and is dizzy x 2 days. Patient denies abdominal pain.  Patient has not had any medications for her symptoms.

## 2024-02-27 NOTE — Telephone Encounter (Signed)
 This clinical research associate called Pt's spouse to report her purse was lrft at [facility] today. No answer at number called [phone]

## 2024-02-27 NOTE — ED Provider Notes (Addendum)
 [facility]    CSN: [id number] Arrival date & time: 02/27/24  1006      History   Chief Complaint Chief Complaint  Patient presents with   Emesis   Dizziness    HPI Joann Campos is a 68 y.o. female.   [name] is a 68 year old female who presents today with 2-day history of nausea and vomiting.  As [place] is feeling unwell her history is supplemented by her husband who accompanies her to the clinic today.  She reports that 2 days ago she began to experience nausea and vomiting.  She reports that she was awake for the last 2 nights due to emesis.  Her last episode of emesis was in the early hours of this morning.  Since this time she has been able to drink small sips of fluids without any further nausea and vomiting.  She reports that the nausea is associated by mild dizziness.  The dizziness is intermittent described as mild lightheadedness.  It has not caused any falls or injury. She has been able to ambulate in the home without difficulty. She denies history of cardiovascular disease and abdominal surgeries, but does have a history of diabetes.  She denies ear pain/fullness, blurred vision, headaches, chest pain, heart palpitations, shortness of breath, abdominal pain, constipation, diarrhea, and leg swelling. Emesis has been non bloody. Patient and husband deny  any confusion, loss of consciousness, and syncopal episodes.  Patient admitted that she is very tired with decreased appetite, and has been reports that she is not as verbose as she normally is.  She denies known exposure to others with similar symptoms as well as known exposure to foodborne illness.  She denies any changes in medications.  Though her husband reports that she had a similar episode in the past related to the change in diabetes medication, and she had to be admitted to the hospital.  She has not taken anything over-the-counter for her symptoms.  The history is provided by the patient and the  spouse.  Emesis Associated symptoms: no abdominal pain, no arthralgias, no chills, no diarrhea, no fever and no headaches   Dizziness Associated symptoms: vomiting and weakness   Associated symptoms: no diarrhea, no headaches and no nausea     Past Medical History:  Diagnosis Date   Diabetes (HCC) 09/2021   Hip pain 2021   Hyperlipidemia     Patient Active Problem List   Diagnosis Date Noted   Ureteral calculus, right 10/05/2023   Need for pneumococcal 20-valent conjugate vaccination 07/25/2023   Osteoporosis screening 07/25/2023   History of colonic polyps 10/13/2022   Abnormal finding on GI tract imaging 08/18/2022   Depression 08/05/2022   Rectal adenoma 03/01/2022   Grade III internal hemorrhoids 03/01/2022   Mixed hyperlipidemia 09/30/2021   Type 2 diabetes mellitus with hyperlipidemia (HCC) 09/30/2021   Diabetic neuropathy (HCC) 09/29/2021   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    Past Surgical History:  Procedure Laterality Date   BREAST REDUCTION SURGERY Bilateral    1990s   CHOLECYSTECTOMY     COLONOSCOPY WITH PROPOFOL  N/A 03/01/2022   Procedure: COLONOSCOPY WITH PROPOFOL ;  Surgeon: San Sandor GAILS, DO;  Location: [facility];  Service: Gastroenterology;  Laterality: N/A;   ENDOSCOPIC MUCOSAL RESECTION N/A 03/01/2022   Procedure: ENDOSCOPIC MUCOSAL RESECTION;  Surgeon: San Sandor GAILS, DO;  Location: [facility];  Service: Gastroenterology;  Laterality: N/A;   FLEXIBLE SIGMOIDOSCOPY N/A 10/13/2022   Procedure: FLEXIBLE SIGMOIDOSCOPY;  Surgeon: San Sandor GAILS, DO;  Location:  [facility];  Service: Gastroenterology;  Laterality: N/A;   SCLEROTHERAPY  03/01/2022   Procedure: SCLEROTHERAPY;  Surgeon: San Sandor GAILS, DO;  Location: [facility];  Service: Gastroenterology;;    OB History   No obstetric history on file.      Home Medications    Prior to Admission medications   Medication Sig Start Date End Date Taking? Authorizing Provider  CONTOUR  NEXT TEST test strip USE AS DIRECTED 10/09/23   Tobie Gaines, DO  Cyanocobalamin (VITAMIN B 12 PO) Take 1 tablet by mouth daily. One tablet    [provider]  FLUoxetine  (PROZAC ) 10 MG capsule TAKE 1 CAPSULE BY MOUTH DAILY 10/10/23   Tobie Gaines, DO  gabapentin  (NEURONTIN ) 300 MG capsule TAKE 2 CAPSULES BY MOUTH AT  BEDTIME 09/27/23   Renne Homans, MD  insulin  glargine (LANTUS  SOLOSTAR) 100 UNIT/ML Solostar Pen Inject 10 Units into the skin at bedtime. 06/23/23   Tobie Gaines, DO  Insulin  Pen Needle (BD PEN NEEDLE NANO 2ND GEN) 32G X 4 MM MISC USE 1 EACH DAILY 10/10/23   Tobie Gaines, DO  metFORMIN  (GLUCOPHAGE -XR) 500 MG 24 hr tablet TAKE 2 TABLETS BY MOUTH TWICE  DAILY WITH A MEAL 07/13/23   Tobie Gaines, DO  Microlet Lancets MISC TEST ONCE DAILY 07/21/23   Tobie Gaines, DO  Multiple Vitamins-Minerals (MULTIVITAMIN ADULTS PO) Take 1 tablet by mouth daily. Women over 50    [provider]  ondansetron  (ZOFRAN -ODT) 4 MG disintegrating tablet Take 1 tablet (4 mg total) by mouth every 8 (eight) hours as needed. 02/27/24   Leatrice Vernell HERO, NP  oxyCODONE -acetaminophen  (PERCOCET/ROXICET) 5-325 MG tablet Take 1 tablet by mouth every 6 (six) hours as needed for severe pain (pain score 7-10). Patient not taking: Reported on 10/18/2023 10/05/23   Roseann Adine PARAS., MD  rosuvastatin  (CRESTOR ) 20 MG tablet TAKE 1 TABLET BY MOUTH DAILY 10/10/23   Tobie Gaines, DO  tamsulosin  (FLOMAX ) 0.4 MG CAPS capsule Take 1 capsule (0.4 mg total) by mouth daily. 09/29/23   Smoot, Lauraine LABOR, PA-C    Family History Family History  Problem Relation Age of Onset   Colon polyps Mother 32   Colon cancer Mother 39   Hypercholesterolemia Father    Diabetes Brother    CVA Brother    CVA Sibling    Crohn's disease Neg Hx    Esophageal cancer Neg Hx    Rectal cancer Neg Hx    Stomach cancer Neg Hx    Ulcerative colitis Neg Hx    Breast cancer Neg Hx    BRCA 1/2 Neg Hx     Social History Social History   Tobacco  Use   Smoking status: Former    Types: Cigarettes    Passive exposure: Never   Smokeless tobacco: Never  Vaping Use   Vaping status: Never Used  Substance Use Topics   Alcohol use: Never   Drug use: Never     Allergies   Patient has no known allergies.   Review of Systems Review of Systems  Constitutional:  Positive for activity change, appetite change and fatigue. Negative for chills, diaphoresis, fever and unexpected weight change.  HENT: Negative.    Gastrointestinal:  Positive for vomiting. Negative for abdominal distention, abdominal pain, constipation, diarrhea and nausea.  Musculoskeletal:  Negative for arthralgias.  Neurological:  Positive for dizziness and weakness. Negative for syncope, light-headedness, numbness and headaches.     Physical Exam Triage Vital Signs ED Triage Vitals [02/27/24 1021]  Encounter Vitals Group     BP 109/73     Girls Systolic BP Percentile      Girls Diastolic BP Percentile      Boys Systolic BP Percentile      Boys Diastolic BP Percentile      Pulse Rate (!) 121     Resp 14     Temp 98.3 F (36.8 C)     Temp Source Oral     SpO2 95 %     Weight      Height      Head Circumference      Peak Flow      Pain Score 0     Pain Loc      Pain Education      Exclude from Growth Chart    No data found.  Updated Vital Signs BP (!) 144/74   Pulse 91   Temp 98.4 F (36.9 C)   Resp 18   SpO2 94%   Visual Acuity Right Eye Distance:   Left Eye Distance:   Bilateral Distance:    Right Eye Near:   Left Eye Near:    Bilateral Near:     Physical Exam Vitals and nursing note reviewed.  Constitutional:      General: She is not in acute distress.    Appearance: Normal appearance. She is normal weight. She is ill-appearing. She is not toxic-appearing.  HENT:     Right Ear: Tympanic membrane and ear canal normal.     Left Ear: Tympanic membrane and ear canal normal.  Eyes:     General: Lids are normal.     Extraocular  Movements: Extraocular movements intact.     Right eye: Normal extraocular motion and no nystagmus.     Left eye: Normal extraocular motion and no nystagmus.     Conjunctiva/sclera: Conjunctivae normal.     Right eye: Right conjunctiva is not injected. No hemorrhage.    Left eye: Left conjunctiva is not injected. No hemorrhage. Cardiovascular:     Rate and Rhythm: Regular rhythm. Tachycardia present.     Pulses:          Radial pulses are 2+ on the right side and 2+ on the left side.       Dorsalis pedis pulses are 2+ on the right side and 2+ on the left side.       Posterior tibial pulses are 2+ on the right side and 2+ on the left side.     Heart sounds: Normal heart sounds.  Pulmonary:     Effort: Pulmonary effort is normal.     Breath sounds: Normal breath sounds and air entry.  Abdominal:     General: Abdomen is flat. Bowel sounds are increased.     Palpations: Abdomen is soft. There is no mass or pulsatile mass.     Tenderness: There is abdominal tenderness (mild tenderness) in the epigastric area and left lower quadrant. There is no right CVA tenderness, left CVA tenderness, guarding or rebound. Negative signs include Murphy's sign, Rovsing's sign, McBurney's sign, psoas sign and obturator sign.     Hernia: No hernia is present.  Musculoskeletal:     Lumbar back: Decreased range of motion: Guarded due to pain. Negative right straight leg raise test and negative left straight leg raise test.  Lymphadenopathy:     Cervical: No cervical adenopathy.     Right cervical: No posterior cervical adenopathy.    Left cervical: No posterior cervical adenopathy.  Skin:    General: Skin is warm and dry.     Findings: No rash.  Neurological:     General: No focal deficit present.     Mental Status: She is alert and oriented to person, place, and time.     GCS: GCS eye subscore is 4. GCS verbal subscore is 5. GCS motor subscore is 6.     Cranial Nerves: Cranial nerves 2-12 are intact.      Sensory: Sensation is intact.     Motor: Motor function is intact.     Coordination: Coordination is intact.     Gait: Gait is intact.  Psychiatric:        Mood and Affect: Mood normal.        Behavior: Behavior normal.      UC Treatments / Results  Labs (all labs ordered are listed, but only abnormal results are displayed) Labs Reviewed  POCT URINE DIPSTICK - Abnormal; Notable for the following components:      Result Value   Clarity, UA cloudy (*)    Bilirubin, UA moderate (*)    Ketones, POC UA >= (160) (*)    Spec Grav, UA >=1.030 (*)    Blood, UA moderate (*)    POC PROTEIN,UA >=300 (*)    All other components within normal limits  GLUCOSE, POCT (MANUAL RESULT ENTRY) - Abnormal; Notable for the following components:   POC Glucose 183 (*)    All other components within normal limits    EKG- I personally reviewed the EKG. NSR with ventricular rate of 96. No ST elevation.    Radiology No results found.  Procedures Procedures (including critical care time)  Medications Ordered in UC Medications  sodium chloride  0.9 % bolus 1,000 mL (0 mLs Intravenous Stopped 02/27/24 1213)    Initial Impression / Assessment and Plan / UC Course  I have reviewed the triage vital signs and the nursing notes.  Pertinent labs & imaging results that were available during my care of the patient were reviewed by me and considered in my medical decision making (see chart for details).  Clinical Course as of 02/27/24 1253  Tue Feb 27, 2024  1112 NSR with ventricular rate of 96. Normal PR interval and Axis. No ST elevation.  [RS]    Clinical Course User Index [RS] Leatrice Vernell HERO, NP   [name] is a 68 year old female with a history of diabetes who presents with a 2-day history of nausea and vomiting, now improving, with associated mild dehydration and intermittent lightheadedness. Exam is notable for tachycardia and mild abdominal discomfort, but no focal findings to suggest  acute cardiac, neurologic, or surgical pathology. EKG is without acute changes, glucose is mildly elevated, and urinalysis is unremarkable. The most likely diagnosis is viral gastroenteritis with mild dehydration.  She was given a fluid bolus in the office with improvement. She is able to tolerate small sips of fluids and has not experienced dizziness, nausea, or vomiting while in the office. Vitals improved after Bolus and she reports feeling much better.  She is advised to continue oral hydration at home, advance her diet as tolerated, and monitor her blood glucose closely. Prescription for Ondansetron  ODT 4mg  every 6 hours PRN prescribed. She should return to clinic or seek emergency care if she develops persistent vomiting, inability to tolerate fluids, confusion, chest pain, severe abdominal pain, or other concerning symptoms. She and her husband were counseled on signs of dehydration and when to seek further care. No  evidence of acute cardiac, pulmonary, or abdominal process at this time. Will follow up as needed.  Final Clinical Impressions(s) / UC Diagnoses   Final diagnoses:  Tachycardia  Nausea and vomiting, unspecified vomiting type  Viral gastroenteritis     Discharge Instructions       Diagnosis: You have been diagnosed with viral gastroenteritis, a common stomach infection that causes nausea and vomiting. You also experienced mild dehydration due to fluid loss.  Your Visit Today: - You were seen in the clinic for two days of nausea and vomiting, with some mild dizziness and fatigue. - Your physical exam showed mild abdominal discomfort and a fast heart rate, which can happen with dehydration. - Your heart tracing (EKG), urine test, and blood sugar were checked and did not show any serious problems. - You received intravenous (IV) fluids in the clinic to help with dehydration, and you felt better after the treatment. - You are now able to keep down small sips of fluids and have  not vomited since this morning. - You were prescribed ondansetron  (Zofran ) to help control your nausea at home.  Medications:  - Ondansetron  (Zofran ):   - Take as prescribed to help control nausea and prevent further vomiting.   - If you continue to have trouble keeping fluids down, you may take another dose as directed.   - Do not take more than the prescribed amount.  Home Care Instructions:  1. Hydration: - Sip clear fluids frequently (water, oral rehydration solutions, clear broths, diluted juice, or electrolyte drinks). - Avoid large amounts at once; small, frequent sips are best. - Avoid caffeine, alcohol, and very sugary drinks.  2. Diet: - Once vomiting stops, slowly advance your diet as tolerated. - Start with bland foods (crackers, toast, rice, bananas, applesauce). - Avoid spicy, fatty, or heavy foods until you feel better.  3. Rest: - Get plenty of rest to help your body recover.  4. Blood Sugar Monitoring: - Check your blood sugar more frequently while you are ill. - If you are unable to eat or keep fluids down, monitor for signs of low or high blood sugar and follow your sick day diabetes plan.  5. Medication: - Take your diabetes medications as directed unless otherwise instructed. - If you are unable to keep your diabetes medication down, contact the clinic for further instructions.  When to Seek Medical Attention: - Persistent vomiting or inability to keep fluids down for more than 24 hours - Signs of dehydration (dry mouth, decreased urination, dizziness, confusion, rapid heartbeat) - Severe abdominal pain - Chest pain, shortness of breath, or palpitations - Confusion, fainting, or new weakness - Blood in vomit or stool - High or low blood sugar readings that you cannot manage at home   Summary: Today you were evaluated for nausea and vomiting, received IV fluids, and were prescribed medication to help with your symptoms. Your tests did not show any  serious problems. Continue to rest, stay hydrated, and take your prescribed medication for nausea. Monitor your symptoms and blood sugar closely. If you have any concerns or your symptoms worsen, seek medical attention promptly.      ED Prescriptions     Medication Sig Dispense Auth. Provider   ondansetron  (ZOFRAN -ODT) 4 MG disintegrating tablet Take 1 tablet (4 mg total) by mouth every 8 (eight) hours as needed. 20 tablet Leatrice Vernell HERO, NP      PDMP not reviewed this encounter.   Leatrice Vernell HERO, NP 02/27/24 1251  Leatrice Vernell HERO, NP 02/27/24 1253

## 2024-02-27 NOTE — Discharge Instructions (Addendum)
  Diagnosis: You have been diagnosed with viral gastroenteritis, a common stomach infection that causes nausea and vomiting. You also experienced mild dehydration due to fluid loss.  Your Visit Today: - You were seen in the clinic for two days of nausea and vomiting, with some mild dizziness and fatigue. - Your physical exam showed mild abdominal discomfort and a fast heart rate, which can happen with dehydration. - Your heart tracing (EKG), urine test, and blood sugar were checked and did not show any serious problems. - You received intravenous (IV) fluids in the clinic to help with dehydration, and you felt better after the treatment. - You are now able to keep down small sips of fluids and have not vomited since this morning. - You were prescribed ondansetron  (Zofran ) to help control your nausea at home.  Medications:  - Ondansetron  (Zofran ):   - Take as prescribed to help control nausea and prevent further vomiting.   - If you continue to have trouble keeping fluids down, you may take another dose as directed.   - Do not take more than the prescribed amount.  Home Care Instructions:  1. Hydration: - Sip clear fluids frequently (water, oral rehydration solutions, clear broths, diluted juice, or electrolyte drinks). - Avoid large amounts at once; small, frequent sips are best. - Avoid caffeine, alcohol, and very sugary drinks.  2. Diet: - Once vomiting stops, slowly advance your diet as tolerated. - Start with bland foods (crackers, toast, rice, bananas, applesauce). - Avoid spicy, fatty, or heavy foods until you feel better.  3. Rest: - Get plenty of rest to help your body recover.  4. Blood Sugar Monitoring: - Check your blood sugar more frequently while you are ill. - If you are unable to eat or keep fluids down, monitor for signs of low or high blood sugar and follow your sick day diabetes plan.  5. Medication: - Take your diabetes medications as directed unless otherwise  instructed. - If you are unable to keep your diabetes medication down, contact the clinic for further instructions.  When to Seek Medical Attention: - Persistent vomiting or inability to keep fluids down for more than 24 hours - Signs of dehydration (dry mouth, decreased urination, dizziness, confusion, rapid heartbeat) - Severe abdominal pain - Chest pain, shortness of breath, or palpitations - Confusion, fainting, or new weakness - Blood in vomit or stool - High or low blood sugar readings that you cannot manage at home   Summary: Today you were evaluated for nausea and vomiting, received IV fluids, and were prescribed medication to help with your symptoms. Your tests did not show any serious problems. Continue to rest, stay hydrated, and take your prescribed medication for nausea. Monitor your symptoms and blood sugar closely. If you have any concerns or your symptoms worsen, seek medical attention promptly.

## 2024-03-08 ENCOUNTER — Emergency Department (HOSPITAL_COMMUNITY)

## 2024-03-08 ENCOUNTER — Inpatient Hospital Stay (HOSPITAL_COMMUNITY)

## 2024-03-08 ENCOUNTER — Inpatient Hospital Stay (HOSPITAL_COMMUNITY)
Admission: EM | Admit: 2024-03-08 | Discharge: 2024-03-14 | Disposition: A | Discharge status: Home / Self Care | Attending: Internal Medicine | Admitting: Internal Medicine

## 2024-03-08 ENCOUNTER — Other Ambulatory Visit: Payer: Self-pay

## 2024-03-08 DIAGNOSIS — E785 Hyperlipidemia, unspecified: Secondary | ICD-10-CM

## 2024-03-08 DIAGNOSIS — E876 Hypokalemia: Secondary | ICD-10-CM | POA: Diagnosis not present

## 2024-03-08 DIAGNOSIS — D649 Anemia, unspecified: Secondary | ICD-10-CM | POA: Diagnosis present

## 2024-03-08 DIAGNOSIS — E860 Dehydration: Secondary | ICD-10-CM | POA: Diagnosis present

## 2024-03-08 DIAGNOSIS — T383X5A Adverse effect of insulin and oral hypoglycemic [antidiabetic] drugs, initial encounter: Secondary | ICD-10-CM | POA: Diagnosis present

## 2024-03-08 DIAGNOSIS — I10 Essential (primary) hypertension: Secondary | ICD-10-CM | POA: Diagnosis present

## 2024-03-08 DIAGNOSIS — E1142 Type 2 diabetes mellitus with diabetic polyneuropathy: Secondary | ICD-10-CM

## 2024-03-08 DIAGNOSIS — N390 Urinary tract infection, site not specified: Secondary | ICD-10-CM | POA: Diagnosis present

## 2024-03-08 DIAGNOSIS — Z9049 Acquired absence of other specified parts of digestive tract: Secondary | ICD-10-CM

## 2024-03-08 DIAGNOSIS — Z794 Long term (current) use of insulin: Secondary | ICD-10-CM | POA: Diagnosis not present

## 2024-03-08 DIAGNOSIS — L249 Irritant contact dermatitis, unspecified cause: Secondary | ICD-10-CM | POA: Diagnosis not present

## 2024-03-08 DIAGNOSIS — R571 Hypovolemic shock: Secondary | ICD-10-CM | POA: Diagnosis present

## 2024-03-08 DIAGNOSIS — F419 Anxiety disorder, unspecified: Secondary | ICD-10-CM | POA: Diagnosis present

## 2024-03-08 DIAGNOSIS — R1110 Vomiting, unspecified: Secondary | ICD-10-CM | POA: Diagnosis present

## 2024-03-08 DIAGNOSIS — Z83719 Family history of colon polyps, unspecified: Secondary | ICD-10-CM

## 2024-03-08 DIAGNOSIS — E1169 Type 2 diabetes mellitus with other specified complication: Secondary | ICD-10-CM

## 2024-03-08 DIAGNOSIS — J9811 Atelectasis: Secondary | ICD-10-CM | POA: Diagnosis present

## 2024-03-08 DIAGNOSIS — R197 Diarrhea, unspecified: Secondary | ICD-10-CM | POA: Diagnosis not present

## 2024-03-08 DIAGNOSIS — E1165 Type 2 diabetes mellitus with hyperglycemia: Secondary | ICD-10-CM | POA: Diagnosis not present

## 2024-03-08 DIAGNOSIS — E440 Moderate protein-calorie malnutrition: Secondary | ICD-10-CM | POA: Diagnosis not present

## 2024-03-08 DIAGNOSIS — R9431 Abnormal electrocardiogram [ECG] [EKG]: Secondary | ICD-10-CM | POA: Diagnosis not present

## 2024-03-08 DIAGNOSIS — Z781 Physical restraint status: Secondary | ICD-10-CM

## 2024-03-08 DIAGNOSIS — E870 Hyperosmolality and hypernatremia: Secondary | ICD-10-CM | POA: Diagnosis not present

## 2024-03-08 DIAGNOSIS — Z83438 Family history of other disorder of lipoprotein metabolism and other lipidemia: Secondary | ICD-10-CM

## 2024-03-08 DIAGNOSIS — F418 Other specified anxiety disorders: Secondary | ICD-10-CM

## 2024-03-08 DIAGNOSIS — R6521 Severe sepsis with septic shock: Secondary | ICD-10-CM | POA: Diagnosis present

## 2024-03-08 DIAGNOSIS — E861 Hypovolemia: Secondary | ICD-10-CM | POA: Diagnosis present

## 2024-03-08 DIAGNOSIS — Z6821 Body mass index (BMI) 21.0-21.9, adult: Secondary | ICD-10-CM

## 2024-03-08 DIAGNOSIS — Z7984 Long term (current) use of oral hypoglycemic drugs: Secondary | ICD-10-CM

## 2024-03-08 DIAGNOSIS — Z823 Family history of stroke: Secondary | ICD-10-CM

## 2024-03-08 DIAGNOSIS — A4151 Sepsis due to Escherichia coli [E. coli]: Principal | ICD-10-CM | POA: Diagnosis present

## 2024-03-08 DIAGNOSIS — N179 Acute kidney failure, unspecified: Secondary | ICD-10-CM | POA: Diagnosis present

## 2024-03-08 DIAGNOSIS — R451 Restlessness and agitation: Secondary | ICD-10-CM | POA: Diagnosis not present

## 2024-03-08 DIAGNOSIS — A419 Sepsis, unspecified organism: Secondary | ICD-10-CM | POA: Diagnosis present

## 2024-03-08 DIAGNOSIS — Z833 Family history of diabetes mellitus: Secondary | ICD-10-CM

## 2024-03-08 DIAGNOSIS — N200 Calculus of kidney: Secondary | ICD-10-CM | POA: Diagnosis present

## 2024-03-08 DIAGNOSIS — Z87891 Personal history of nicotine dependence: Secondary | ICD-10-CM

## 2024-03-08 DIAGNOSIS — E8809 Other disorders of plasma-protein metabolism, not elsewhere classified: Secondary | ICD-10-CM | POA: Diagnosis not present

## 2024-03-08 DIAGNOSIS — E872 Acidosis, unspecified: Secondary | ICD-10-CM | POA: Diagnosis present

## 2024-03-08 DIAGNOSIS — Z800 Family history of malignant neoplasm of digestive organs: Secondary | ICD-10-CM

## 2024-03-08 DIAGNOSIS — K219 Gastro-esophageal reflux disease without esophagitis: Secondary | ICD-10-CM | POA: Diagnosis present

## 2024-03-08 DIAGNOSIS — Z79899 Other long term (current) drug therapy: Secondary | ICD-10-CM

## 2024-03-08 LAB — CBC WITH DIFFERENTIAL/PLATELET
Abs Immature Granulocytes: 0.65 K/uL — ABNORMAL HIGH (ref 0.00–0.07)
Basophils Absolute: 0.1 K/uL (ref 0.0–0.1)
Basophils Relative: 0 %
Eosinophils Absolute: 0 K/uL (ref 0.0–0.5)
Eosinophils Relative: 0 %
HCT: 41.8 % (ref 36.0–46.0)
Hemoglobin: 13.2 g/dL (ref 12.0–15.0)
Immature Granulocytes: 3 %
Lymphocytes Relative: 11 %
Lymphs Abs: 2.2 K/uL (ref 0.7–4.0)
MCH: 28.4 pg (ref 26.0–34.0)
MCHC: 31.6 g/dL (ref 30.0–36.0)
MCV: 89.9 fL (ref 80.0–100.0)
Monocytes Absolute: 0.5 K/uL (ref 0.1–1.0)
Monocytes Relative: 3 %
Neutro Abs: 16.2 K/uL — ABNORMAL HIGH (ref 1.7–7.7)
Neutrophils Relative %: 83 %
Platelets: 549 K/uL — ABNORMAL HIGH (ref 150–400)
RBC: 4.65 MIL/uL (ref 3.87–5.11)
RDW: 13.2 % (ref 11.5–15.5)
WBC: 19.6 K/uL — ABNORMAL HIGH (ref 4.0–10.5)
nRBC: 0 % (ref 0.0–0.2)

## 2024-03-08 LAB — GLUCOSE, CAPILLARY
Glucose-Capillary: 235 mg/dL — ABNORMAL HIGH (ref 70–99)
Glucose-Capillary: 252 mg/dL — ABNORMAL HIGH (ref 70–99)
Glucose-Capillary: 211 mg/dL — ABNORMAL HIGH (ref 70–99)

## 2024-03-08 LAB — COMPREHENSIVE METABOLIC PANEL WITH GFR
ALT: 32 U/L (ref 0–44)
AST: 45 U/L — ABNORMAL HIGH (ref 15–41)
Albumin: 4.2 g/dL (ref 3.5–5.0)
Alkaline Phosphatase: 99 U/L (ref 38–126)
Anion gap: 47 — ABNORMAL HIGH (ref 5–15)
BUN: 87 mg/dL — ABNORMAL HIGH (ref 8–23)
CO2: 8 mmol/L — ABNORMAL LOW (ref 22–32)
Calcium: 10.4 mg/dL — ABNORMAL HIGH (ref 8.9–10.3)
Chloride: 90 mmol/L — ABNORMAL LOW (ref 98–111)
Creatinine, Ser: 10.9 mg/dL — ABNORMAL HIGH (ref 0.44–1.00)
GFR, Estimated: 4 mL/min — ABNORMAL LOW
Glucose, Bld: 90 mg/dL (ref 70–99)
Potassium: 4.2 mmol/L (ref 3.5–5.1)
Sodium: 145 mmol/L (ref 135–145)
Total Bilirubin: 0.3 mg/dL (ref 0.0–1.2)
Total Protein: 7.4 g/dL (ref 6.5–8.1)

## 2024-03-08 LAB — URINALYSIS, W/ REFLEX TO CULTURE (INFECTION SUSPECTED)
Bilirubin Urine: NEGATIVE
Glucose, UA: 50 mg/dL — AB
Ketones, ur: 20 mg/dL — AB
Nitrite: NEGATIVE
Protein, ur: 30 mg/dL — AB
Specific Gravity, Urine: 1.009 (ref 1.005–1.030)
pH: 5 (ref 5.0–8.0)

## 2024-03-08 LAB — LACTIC ACID, PLASMA
Lactic Acid, Venous: >9 mmol/L (ref 0.5–1.9)
Lactic Acid, Venous: 5.8 mmol/L (ref 0.5–1.9)

## 2024-03-08 LAB — HIV ANTIBODY (ROUTINE TESTING W REFLEX): HIV Screen 4th Generation wRfx: NONREACTIVE

## 2024-03-08 LAB — BLOOD GAS, VENOUS
Acid-base deficit: 23.1 mmol/L — ABNORMAL HIGH (ref 0.0–2.0)
Bicarbonate: 6.8 mmol/L — ABNORMAL LOW (ref 20.0–28.0)
O2 Saturation: 68.1 %
Patient temperature: 37
pCO2, Ven: 27 mmHg — ABNORMAL LOW (ref 44–60)
pH, Ven: 7.01 — CL (ref 7.25–7.43)
pO2, Ven: 46 mmHg — ABNORMAL HIGH (ref 32–45)

## 2024-03-08 LAB — HEMOGLOBIN A1C
Hgb A1c MFr Bld: 6.8 % — ABNORMAL HIGH (ref 4.8–5.6)
Mean Plasma Glucose: 148.46 mg/dL

## 2024-03-08 LAB — I-STAT CG4 LACTIC ACID, ED: Lactic Acid, Venous: 13.6 mmol/L (ref 0.5–1.9)

## 2024-03-08 LAB — MRSA NEXT GEN BY PCR, NASAL: MRSA by PCR Next Gen: NOT DETECTED

## 2024-03-08 LAB — PROCALCITONIN: Procalcitonin: 1.42 ng/mL

## 2024-03-08 LAB — LIPASE, BLOOD: Lipase: 64 U/L — ABNORMAL HIGH (ref 11–51)

## 2024-03-08 LAB — MAGNESIUM: Magnesium: 2.1 mg/dL (ref 1.7–2.4)

## 2024-03-08 LAB — CORTISOL: Cortisol, Plasma: 42.4 ug/dL

## 2024-03-08 MED ORDER — LACTATED RINGERS IV BOLUS
1000.0000 mL | Freq: Once | INTRAVENOUS | Status: AC
  Administered 2024-03-08: 1000 mL via INTRAVENOUS

## 2024-03-08 MED ORDER — METRONIDAZOLE 500 MG/100ML IV SOLN
500.0000 mg | Freq: Two times a day (BID) | INTRAVENOUS | Status: DC
  Administered 2024-03-08 – 2024-03-14 (×12): 500 mg via INTRAVENOUS
  Filled 2024-03-08 (×12): qty 100

## 2024-03-08 MED ORDER — POLYETHYLENE GLYCOL 3350 17 G PO PACK: 17.0000 g | PACK | Freq: Every day | ORAL | Status: DC | PRN

## 2024-03-08 MED ORDER — NOREPINEPHRINE 4 MG/250ML-% IV SOLN
0.0000 ug/min | INTRAVENOUS | Status: DC
  Administered 2024-03-08: 2 ug/min via INTRAVENOUS
  Administered 2024-03-08: 18 ug/min via INTRAVENOUS
  Administered 2024-03-08: 25 ug/min via INTRAVENOUS
  Filled 2024-03-08 (×2): qty 250

## 2024-03-08 MED ORDER — DOCUSATE SODIUM 100 MG PO CAPS: 100.0000 mg | ORAL_CAPSULE | Freq: Two times a day (BID) | ORAL | Status: DC | PRN

## 2024-03-08 MED ORDER — PRISMASOL BGK 4/2.5 32-4-2.5 MEQ/L EC SOLN: Status: DC

## 2024-03-08 MED ORDER — SODIUM CHLORIDE 0.9 % IV SOLN
2.0000 g | Freq: Two times a day (BID) | INTRAVENOUS | Status: DC
  Administered 2024-03-09 (×2): 2 g via INTRAVENOUS
  Filled 2024-03-08 (×2): qty 12.5

## 2024-03-08 MED ORDER — STERILE WATER FOR INJECTION IV SOLN
INTRAVENOUS | Status: DC
  Filled 2024-03-08: qty 150
  Filled 2024-03-08 (×2): qty 1000

## 2024-03-08 MED ORDER — INSULIN ASPART 100 UNIT/ML IJ SOLN
0.0000 [IU] | INTRAMUSCULAR | Status: DC
  Administered 2024-03-08: 5 [IU] via SUBCUTANEOUS
  Administered 2024-03-08 (×2): 3 [IU] via SUBCUTANEOUS
  Administered 2024-03-09 (×3): 2 [IU] via SUBCUTANEOUS
  Administered 2024-03-09: 1 [IU] via SUBCUTANEOUS
  Administered 2024-03-10 (×3): 2 [IU] via SUBCUTANEOUS
  Administered 2024-03-10 (×3): 1 [IU] via SUBCUTANEOUS
  Administered 2024-03-11 (×3): 2 [IU] via SUBCUTANEOUS
  Administered 2024-03-11 (×2): 1 [IU] via SUBCUTANEOUS
  Administered 2024-03-12: 2 [IU] via SUBCUTANEOUS
  Administered 2024-03-12: 5 [IU] via SUBCUTANEOUS
  Administered 2024-03-12: 2 [IU] via SUBCUTANEOUS
  Administered 2024-03-12: 1 [IU] via SUBCUTANEOUS
  Administered 2024-03-12 – 2024-03-13 (×2): 7 [IU] via SUBCUTANEOUS
  Administered 2024-03-13: 5 [IU] via SUBCUTANEOUS
  Administered 2024-03-13 (×3): 2 [IU] via SUBCUTANEOUS
  Administered 2024-03-13: 3 [IU] via SUBCUTANEOUS
  Administered 2024-03-14: 1 [IU] via SUBCUTANEOUS
  Administered 2024-03-14: 2 [IU] via SUBCUTANEOUS
  Filled 2024-03-08 (×2): qty 1
  Filled 2024-03-08: qty 2
  Filled 2024-03-08: qty 3
  Filled 2024-03-08 (×2): qty 1
  Filled 2024-03-08 (×2): qty 2
  Filled 2024-03-08: qty 1
  Filled 2024-03-08: qty 2
  Filled 2024-03-08: qty 3
  Filled 2024-03-08: qty 1
  Filled 2024-03-08 (×2): qty 2
  Filled 2024-03-08 (×2): qty 5
  Filled 2024-03-08: qty 3
  Filled 2024-03-08: qty 2
  Filled 2024-03-08: qty 3
  Filled 2024-03-08: qty 7
  Filled 2024-03-08 (×2): qty 2
  Filled 2024-03-08: qty 1
  Filled 2024-03-08: qty 5
  Filled 2024-03-08 (×2): qty 2
  Filled 2024-03-08: qty 1
  Filled 2024-03-08 (×3): qty 2

## 2024-03-08 MED ORDER — HEPARIN SODIUM (PORCINE) 1000 UNIT/ML DIALYSIS
1000.0000 [IU] | INTRAMUSCULAR | Status: DC | PRN
  Administered 2024-03-09: 2400 [IU] via INTRAVENOUS_CENTRAL
  Filled 2024-03-08: qty 3

## 2024-03-08 MED ORDER — NOREPINEPHRINE 16 MG/250ML-% IV SOLN
0.0000 ug/min | INTRAVENOUS | Status: DC
  Administered 2024-03-08: 17 ug/min via INTRAVENOUS
  Filled 2024-03-08: qty 250

## 2024-03-08 MED ORDER — HEPARIN SODIUM (PORCINE) 5000 UNIT/ML IJ SOLN
5000.0000 [IU] | Freq: Three times a day (TID) | INTRAMUSCULAR | Status: DC
  Administered 2024-03-08 – 2024-03-14 (×18): 5000 [IU] via SUBCUTANEOUS
  Filled 2024-03-08 (×18): qty 1

## 2024-03-08 MED ORDER — LACTATED RINGERS IV SOLN: INTRAVENOUS | Status: DC

## 2024-03-08 MED ORDER — ACETAMINOPHEN 325 MG PO TABS: 650.0000 mg | ORAL_TABLET | ORAL | Status: DC | PRN

## 2024-03-08 MED ORDER — ROSUVASTATIN CALCIUM 20 MG PO TABS
20.0000 mg | ORAL_TABLET | Freq: Every day | ORAL | Status: DC
  Administered 2024-03-08 – 2024-03-14 (×6): 20 mg via ORAL
  Filled 2024-03-08 (×6): qty 1

## 2024-03-08 MED ORDER — SODIUM BICARBONATE 8.4 % IV SOLN
100.0000 meq | Freq: Once | INTRAVENOUS | Status: AC
  Administered 2024-03-08: 100 meq via INTRAVENOUS
  Filled 2024-03-08: qty 100

## 2024-03-08 MED ORDER — ORAL CARE MOUTH RINSE: 15.0000 mL | OROMUCOSAL | Status: DC | PRN

## 2024-03-08 MED ORDER — NOREPINEPHRINE 4 MG/250ML-% IV SOLN
INTRAVENOUS | Status: AC
  Filled 2024-03-08: qty 250

## 2024-03-08 MED ORDER — LINEZOLID 600 MG/300ML IV SOLN
600.0000 mg | Freq: Two times a day (BID) | INTRAVENOUS | Status: DC
  Administered 2024-03-08 – 2024-03-09 (×3): 600 mg via INTRAVENOUS
  Filled 2024-03-08 (×4): qty 300

## 2024-03-08 MED ORDER — SODIUM CHLORIDE 0.9 % IV SOLN
1.0000 g | Freq: Once | INTRAVENOUS | Status: AC
  Administered 2024-03-08: 1 g via INTRAVENOUS
  Filled 2024-03-08 (×3): qty 10

## 2024-03-08 MED ORDER — PANTOPRAZOLE SODIUM 40 MG IV SOLR: 40.0000 mg | INTRAVENOUS | Status: DC

## 2024-03-08 MED ORDER — PANTOPRAZOLE SODIUM 40 MG IV SOLR
40.0000 mg | Freq: Two times a day (BID) | INTRAVENOUS | Status: DC
  Administered 2024-03-08 – 2024-03-10 (×5): 40 mg via INTRAVENOUS
  Filled 2024-03-08 (×5): qty 10

## 2024-03-08 MED ORDER — VASOPRESSIN 20 UNITS/100 ML INFUSION FOR SHOCK
0.0000 [IU]/min | INTRAVENOUS | Status: DC
  Administered 2024-03-08 – 2024-03-09 (×2): 0.03 [IU]/min via INTRAVENOUS
  Filled 2024-03-08 (×2): qty 100

## 2024-03-08 MED ORDER — METOCLOPRAMIDE HCL 5 MG/ML IJ SOLN
10.0000 mg | Freq: Once | INTRAMUSCULAR | Status: AC
  Administered 2024-03-08: 10 mg via INTRAVENOUS
  Filled 2024-03-08: qty 2

## 2024-03-08 MED ORDER — SODIUM CHLORIDE 0.9 % IV SOLN: 2.0000 g | Freq: Once | INTRAVENOUS | Status: DC

## 2024-03-08 MED ORDER — METRONIDAZOLE 500 MG/100ML IV SOLN
500.0000 mg | Freq: Once | INTRAVENOUS | Status: AC
  Administered 2024-03-08: 500 mg via INTRAVENOUS
  Filled 2024-03-08: qty 100

## 2024-03-08 MED ORDER — PANTOPRAZOLE SODIUM 40 MG IV SOLR
40.0000 mg | Freq: Once | INTRAVENOUS | Status: AC
  Administered 2024-03-08: 40 mg via INTRAVENOUS
  Filled 2024-03-08: qty 10

## 2024-03-08 MED ORDER — HEPARIN SODIUM (PORCINE) 1000 UNIT/ML DIALYSIS
1000.0000 [IU] | INTRAMUSCULAR | Status: DC | PRN
  Administered 2024-03-08: 2400 [IU] via INTRAVENOUS_CENTRAL
  Filled 2024-03-08: qty 4

## 2024-03-08 MED ORDER — CHLORHEXIDINE GLUCONATE CLOTH 2 % EX PADS
6.0000 | MEDICATED_PAD | Freq: Every day | CUTANEOUS | Status: DC
  Administered 2024-03-08 – 2024-03-13 (×6): 6 via TOPICAL

## 2024-03-08 MED ORDER — VANCOMYCIN HCL IN DEXTROSE 1-5 GM/200ML-% IV SOLN
1000.0000 mg | Freq: Once | INTRAVENOUS | Status: AC
  Administered 2024-03-08: 1000 mg via INTRAVENOUS
  Filled 2024-03-08: qty 200

## 2024-03-08 NOTE — ED Provider Notes (Addendum)
 " [place] EMERGENCY DEPARTMENT AT [facility] Provider Note   CSN: [id number] Arrival date & time: 03/08/24  9064     Patient presents with: Influenza   Joann Campos is a 68 y.o. female.   68 year old female presents with 2 weeks of intermittent epigastric pain with associated emesis.  No fever or chills.  Was seen in urgent care recently for this.  Has not been diagnosed with flu.  Has not had any diarrhea.  No sick contacts at home.  States that nothing seems to bring this up.  Denies any urinary symptoms.  Called EMS and was transported here       Prior to Admission medications  Medication Sig Start Date End Date Taking? Authorizing Provider  CONTOUR NEXT TEST test strip USE AS DIRECTED 10/09/23   Tobie Gaines, DO  Cyanocobalamin (VITAMIN B 12 PO) Take 1 tablet by mouth daily. One tablet    [provider]  FLUoxetine  (PROZAC ) 10 MG capsule TAKE 1 CAPSULE BY MOUTH DAILY 10/10/23   Tobie Gaines, DO  gabapentin  (NEURONTIN ) 300 MG capsule TAKE 2 CAPSULES BY MOUTH AT  BEDTIME 09/27/23   Amoako, Prince, MD  insulin  glargine (LANTUS  SOLOSTAR) 100 UNIT/ML Solostar Pen Inject 10 Units into the skin at bedtime. 06/23/23   Tobie Gaines, DO  Insulin  Pen Needle (BD PEN NEEDLE NANO 2ND GEN) 32G X 4 MM MISC USE 1 EACH DAILY 10/10/23   Tobie Gaines, DO  metFORMIN  (GLUCOPHAGE -XR) 500 MG 24 hr tablet TAKE 2 TABLETS BY MOUTH TWICE  DAILY WITH A MEAL 07/13/23   Tobie Gaines, DO  Microlet Lancets MISC TEST ONCE DAILY 07/21/23   Tobie Gaines, DO  Multiple Vitamins-Minerals (MULTIVITAMIN ADULTS PO) Take 1 tablet by mouth daily. Women over 50    [provider]  ondansetron  (ZOFRAN -ODT) 4 MG disintegrating tablet Take 1 tablet (4 mg total) by mouth every 8 (eight) hours as needed. 02/27/24   Leatrice Vernell HERO, NP  oxyCODONE -acetaminophen  (PERCOCET/ROXICET) 5-325 MG tablet Take 1 tablet by mouth every 6 (six) hours as needed for severe pain (pain score 7-10). Patient not taking:  Reported on 10/18/2023 10/05/23   Roseann Adine PARAS., MD  rosuvastatin  (CRESTOR ) 20 MG tablet TAKE 1 TABLET BY MOUTH DAILY 10/10/23   Tobie Gaines, DO  tamsulosin  (FLOMAX ) 0.4 MG CAPS capsule Take 1 capsule (0.4 mg total) by mouth daily. 09/29/23   Smoot, Lauraine LABOR, PA-C    Allergies: Patient has no known allergies.    Review of Systems  All other systems reviewed and are negative.   Updated Vital Signs BP 97/65   Pulse (!) 103   Temp (!) 97.5 F (36.4 C) (Oral)   Resp 18   Ht 1.651 m (5' 5)   Wt 59 kg   SpO2 98%   BMI 21.64 kg/m   Physical Exam Vitals and nursing note reviewed.  Constitutional:      General: She is not in acute distress.    Appearance: Normal appearance. She is well-developed. She is not toxic-appearing.  HENT:     Head: Normocephalic and atraumatic.  Eyes:     General: Lids are normal.     Conjunctiva/sclera: Conjunctivae normal.     Pupils: Pupils are equal, round, and reactive to light.  Neck:     Thyroid: No thyroid mass.     Trachea: No tracheal deviation.  Cardiovascular:     Rate and Rhythm: Normal rate and regular rhythm.     Heart sounds: Normal heart sounds.  No murmur heard.    No gallop.  Pulmonary:     Effort: Pulmonary effort is normal. No respiratory distress.     Breath sounds: Normal breath sounds. No stridor. No decreased breath sounds, wheezing, rhonchi or rales.  Abdominal:     General: There is no distension.     Palpations: Abdomen is soft.     Tenderness: There is abdominal tenderness in the epigastric area. There is no rebound.   Musculoskeletal:        General: No tenderness. Normal range of motion.     Cervical back: Normal range of motion and neck supple.  Skin:    General: Skin is warm and dry.     Findings: No abrasion or rash.  Neurological:     Mental Status: She is alert and oriented to person, place, and time. Mental status is at baseline.     GCS: GCS eye subscore is 4. GCS verbal subscore is 5. GCS motor  subscore is 6.     Cranial Nerves: No cranial nerve deficit.     Sensory: No sensory deficit.     Motor: Motor function is intact.  Psychiatric:        Attention and Perception: Attention normal.        Speech: Speech normal.        Behavior: Behavior normal.     (all labs ordered are listed, but only abnormal results are displayed) Labs Reviewed  LIPASE, BLOOD  CBC WITH DIFFERENTIAL/PLATELET  COMPREHENSIVE METABOLIC PANEL WITH GFR  URINALYSIS, W/ REFLEX TO CULTURE (INFECTION SUSPECTED)    EKG: EKG Interpretation Date/Time:  Friday March 08 2024 10:17:41 EST Ventricular Rate:  101 PR Interval:  150 QRS Duration:  99 QT Interval:  387 QTC Calculation: 502 R Axis:   51  Text Interpretation: Sinus tachycardia Prolonged QT interval Confirmed by Dasie Faden ([id number]) on 03/08/2024 1:46:00 PM  Radiology: No results found.   Procedures   Medications Ordered in the ED  lactated ringers  bolus 1,000 mL (has no administration in time range)  lactated ringers  infusion (has no administration in time range)  metoCLOPramide  (REGLAN ) injection 10 mg (has no administration in time range)                                    Medical Decision Making Amount and/or Complexity of Data Reviewed Labs: ordered. Radiology: ordered. ECG/medicine tests: ordered.  Risk Prescription drug management. Decision regarding hospitalization.  Patient's EKG shows sinus tachycardia.  Patient was mildly hypotensive here and was started on IV fluids.  Patient became profoundly hypotensive and was given full fluid bolus.  Her creatinine did come back at 10 with a BUN of 87.  Suspect that she was severely dehydrated.  Bladder scan only showed 30 cc in her bladder.  Significant leukocytosis of 19.6 thousand.  Lactate was elevated at 13.  There was a delay in the lactate, back and once it came back patient started on empiric antibiotics.  Did have an abdominal CT which did not show any new findings.   After patient received 3 L of fluid she was started on Levophed .  I discussed the case with Dr. Vassie from nephrology will come and see the patient.  I then discussed the case with critical care who will come and admit  CRITICAL CARE Performed by: Faden ONEIDA Dasie Total critical care time: 75 minutes Critical care time was exclusive of  separately billable procedures and treating other patients. Critical care was necessary to treat or prevent imminent or life-threatening deterioration. Critical care was time spent personally by me on the following activities: development of treatment plan with patient and/or surrogate as well as nursing, discussions with consultants, evaluation of patient's response to treatment, examination of patient, obtaining history from patient or surrogate, ordering and performing treatments and interventions, ordering and review of laboratory studies, ordering and review of radiographic studies, pulse oximetry and re-evaluation of patient's condition.      Final diagnoses:  None    ED Discharge Orders     None          Dasie Faden, MD 03/08/24 1349    Dasie Faden, MD 03/08/24 1350  "

## 2024-03-08 NOTE — H&P (Signed)
 "  NAME:  Joann Campos, MRN:  [id number], DOB:  1955/08/09, LOS: 0 ADMISSION DATE:  03/08/2024, CONSULTATION DATE:  12/19 REFERRING MD:  Dr. Dasie, CHIEF COMPLAINT:  aki and septic shock   History of Present Illness:  Patient is a 68 yo F w/ pertinent PMH DMT2, HLD, peripheral neuropathy, anxiety presents to [facility] ED on 12/19 w/ septic shock and aki.  For the past 2 weeks patient has had N/V, generalized mild abd discomfort, poor po intake. Denies fever, diarrhea or constipation. Was seen by urgent care on 12/9 and given fluids. Thought related to viral illness educated to push po fluids. Symptoms persists and came to [facility] ED on 12/19 for further workup. On arrival hypotensive. Afebrile and wbc 19. LA 13. Cultures obtained, given fluids, and started on broad spectrum abx. CT abd/pelvis w/ no acute findings; chronic bibasilar atelectasis, 1-2 mm nonobstructive left renal calculus. Creat 10.9, bun 87, co2 8, AG 47, k 4.2, calcium  10.4. VBG 7.01, 27, 46, 6.8. Nephro consulted. Despite fluids remained hypotensive and started on levo. Pccm consulted for icu admission.  Pertinent  Medical History   Past Medical History:  Diagnosis Date   Diabetes (HCC) 09/2021   Hip pain 2021   Hyperlipidemia      Significant Hospital Events: Including procedures, antibiotic start and stop dates in addition to other pertinent events   12/19 septic shock on levo and aki starting crrt  Interim History / Subjective:  See above  Objective    Blood pressure (!) 81/54, pulse (!) 110, temperature (!) 97.5 F (36.4 C), temperature source Oral, resp. rate 16, height 5' 5 (1.651 m), weight 59 kg, SpO2 100%.        Intake/Output Summary (Last 24 hours) at 03/08/2024 1356 Last data filed at 03/08/2024 1315 Gross per 24 hour  Intake 3000 ml  Output --  Net 3000 ml   Filed Weights   03/08/24 1001  Weight: 59 kg    Examination: General:  NAD HEENT: MM pink/moist Neuro: Aox3; MAE CV: s1s2, RRR, no  m/r/g PULM:  dim clear BS bilaterally GI: soft, bsx4 active  Extremities: warm/dry, no edema  Skin: no rashes or lesions    Resolved problem list   Assessment and Plan   Shock: hypovolemic and/or Sepsis -possible gastroenteritis; poor po intake past 2 weeks Plan: -levo for map goal >65 -cont broad spectrum abx for now -follow cultures, ua -has b/l atelectasis on CT abd/pelvis; check CXR -giving 2 amps bicarb and starting on bicarb drip; will also start on crrt -trend la -trend wbc/fever curve -send gi panel and rvp  AKI: patient denies any new medications and denies any ibu profen use AGMA Mild hypercalcemia Plan: -bicarb drip -nephro consulted; recommended CRRT; will place hd cath -renal us  -given hx of previous kidney stones and mildly elevated calcium ; check PTH -Trend BMP / urinary output -Replace electrolytes as indicated -Avoid nephrotoxic agents, ensure adequate renal perfusion  Prolonged qtc Plan: -check mag -tele monitoring -hold home psych meds -avoid qtc prolonging agents  DMT2 Plan: -ssi and cbg monitoring -a1c  HLD Plan: -statin  Peripheral neuropathy Plan: -hold gabapentin   Anxiety Plan: -hold prozac    Best Practice (right click and Reselect all SmartList Selections daily)   Diet/type: NPO DVT prophylaxis: prophylactic heparin  GI prophylaxis: PPI Lines: Dialysis Catheter Foley:  Yes, and it is still needed Code Status:  full code Last date of multidisciplinary goals of care discussion [12/19 updated patient and husband at bedside]  Labs   CBC: Recent Labs  Lab 03/08/24 1015  WBC 19.6*  NEUTROABS 16.2*  HGB 13.2  HCT 41.8  MCV 89.9  PLT 549*    Basic Metabolic Panel: Recent Labs  Lab 03/08/24 1015  NA 145  K 4.2  CL 90*  CO2 8*  GLUCOSE 90  BUN 87*  CREATININE 10.90*  CALCIUM  10.4*   GFR: Estimated Creatinine Clearance: 4.6 mL/min (A) (by C-G formula based on SCr of 10.9 mg/dL (H)). Recent Labs   Lab 03/08/24 1015 03/08/24 1332  WBC 19.6*  --   LATICACIDVEN  --  13.6*    Liver Function Tests: Recent Labs  Lab 03/08/24 1015  AST 45*  ALT 32  ALKPHOS 99  BILITOT 0.3  PROT 7.4  ALBUMIN 4.2   Recent Labs  Lab 03/08/24 1015  LIPASE 64*   No results for input(s): AMMONIA in the last 168 hours.  ABG    Component Value Date/Time   HCO3 31.5 (H) 10/04/2023 0210   ACIDBASEDEF 8.4 (H) 08/17/2022 1719   O2SAT 54.8 10/04/2023 0210     Coagulation Profile: No results for input(s): INR, PROTIME in the last 168 hours.  Cardiac Enzymes: No results for input(s): CKTOTAL, CKMB, CKMBINDEX, TROPONINI in the last 168 hours.  HbA1C: Hemoglobin A1C  Date/Time Value Ref Range Status  07/25/2023 08:41 AM 6.8 (A) 4.0 - 5.6 % Final  08/05/2022 09:39 AM 7.6 (A) 4.0 - 5.6 % Final   Hgb A1c MFr Bld  Date/Time Value Ref Range Status  09/29/2021 12:01 PM 12.9 (H) 4.8 - 5.6 % Final    Comment:             Prediabetes: 5.7 - 6.4          Diabetes: >6.4          Glycemic control for adults with diabetes: <7.0     CBG: No results for input(s): GLUCAP in the last 168 hours.  Review of Systems:   Review of Systems  Constitutional:  Negative for fever.  Respiratory:  Negative for shortness of breath.   Cardiovascular:  Negative for chest pain.  Gastrointestinal:  Positive for abdominal pain, nausea and vomiting. Negative for constipation and diarrhea.     Past Medical History:  She,  has a past medical history of Diabetes (HCC) (09/2021), Hip pain (2021), and Hyperlipidemia.   Surgical History:   Past Surgical History:  Procedure Laterality Date   BREAST REDUCTION SURGERY Bilateral    1990s   CHOLECYSTECTOMY     COLONOSCOPY WITH PROPOFOL  N/A 03/01/2022   Procedure: COLONOSCOPY WITH PROPOFOL ;  Surgeon: San Sandor GAILS, DO;  Location: [facility] ENDOSCOPY;  Service: Gastroenterology;  Laterality: N/A;   ENDOSCOPIC MUCOSAL RESECTION N/A 03/01/2022    Procedure: ENDOSCOPIC MUCOSAL RESECTION;  Surgeon: San Sandor GAILS, DO;  Location: [facility] ENDOSCOPY;  Service: Gastroenterology;  Laterality: N/A;   FLEXIBLE SIGMOIDOSCOPY N/A 10/13/2022   Procedure: FLEXIBLE SIGMOIDOSCOPY;  Surgeon: San Sandor GAILS, DO;  Location: [facility] ENDOSCOPY;  Service: Gastroenterology;  Laterality: N/A;   SCLEROTHERAPY  03/01/2022   Procedure: SCLEROTHERAPY;  Surgeon: San Sandor GAILS, DO;  Location: [facility] ENDOSCOPY;  Service: Gastroenterology;;     Social History:   reports that she has quit smoking. Her smoking use included cigarettes. She has never been exposed to tobacco smoke. She has never used smokeless tobacco. She reports that she does not drink alcohol and does not use drugs.   Family History:  Her family history includes CVA in her brother  and sibling; Colon cancer (age of onset: 33) in her mother; Colon polyps (age of onset: 43) in her mother; Diabetes in her brother; Hypercholesterolemia in her father. There is no history of Crohn's disease, Esophageal cancer, Rectal cancer, Stomach cancer, Ulcerative colitis, Breast cancer, or BRCA 1/2.   Allergies Allergies[1]   Home Medications  Prior to Admission medications  Medication Sig Start Date End Date Taking? Authorizing Provider  CONTOUR NEXT TEST test strip USE AS DIRECTED 10/09/23   Tobie Gaines, DO  Cyanocobalamin (VITAMIN B 12 PO) Take 1 tablet by mouth daily. One tablet    [provider]  FLUoxetine  (PROZAC ) 10 MG capsule TAKE 1 CAPSULE BY MOUTH DAILY 10/10/23   Tobie Gaines, DO  gabapentin  (NEURONTIN ) 300 MG capsule TAKE 2 CAPSULES BY MOUTH AT  BEDTIME 09/27/23   Amoako, Prince, MD  insulin  glargine (LANTUS  SOLOSTAR) 100 UNIT/ML Solostar Pen Inject 10 Units into the skin at bedtime. 06/23/23   Tobie Gaines, DO  Insulin  Pen Needle (BD PEN NEEDLE NANO 2ND GEN) 32G X 4 MM MISC USE 1 EACH DAILY 10/10/23   Tobie Gaines, DO  metFORMIN  (GLUCOPHAGE -XR) 500 MG 24 hr tablet TAKE 2 TABLETS BY MOUTH TWICE  DAILY WITH  A MEAL 07/13/23   Tobie Gaines, DO  Microlet Lancets MISC TEST ONCE DAILY 07/21/23   Tobie Gaines, DO  Multiple Vitamins-Minerals (MULTIVITAMIN ADULTS PO) Take 1 tablet by mouth daily. Women over 50    [provider]  ondansetron  (ZOFRAN -ODT) 4 MG disintegrating tablet Take 1 tablet (4 mg total) by mouth every 8 (eight) hours as needed. 02/27/24   Leatrice Vernell HERO, NP  oxyCODONE -acetaminophen  (PERCOCET/ROXICET) 5-325 MG tablet Take 1 tablet by mouth every 6 (six) hours as needed for severe pain (pain score 7-10). Patient not taking: Reported on 10/18/2023 10/05/23   Roseann Adine PARAS., MD  rosuvastatin  (CRESTOR ) 20 MG tablet TAKE 1 TABLET BY MOUTH DAILY 10/10/23   Tobie Gaines, DO  tamsulosin  (FLOMAX ) 0.4 MG CAPS capsule Take 1 capsule (0.4 mg total) by mouth daily. 09/29/23   Smoot, Lauraine LABOR, PA-C     Critical care time: 45 minutes     JD Emilio RIGGERS [facility] 03/08/2024, 1:56 PM  Please see [email or web address] for pager details.  From 7A-7P if no response, please call [phone]. After hours, please call ELink [phone].         [1] No Known Allergies  "

## 2024-03-08 NOTE — ED Notes (Signed)
Pt refused to give urine at this time.

## 2024-03-08 NOTE — Consult Note (Signed)
 Renal Service Consult Note [facility] Lamar JONETTA Fret, MD  Patient: Joann Campos Date: 03/08/2024 Requesting Physician: Dr. Theodoro  Reason for Consult: Renal failure HPI: The patient is a 68 y.o. year-old w/ PMH of diabetes, HL, peripheral neuropathy, anxiety who presented to ED 1219 complaining of 2 weeks of nausea, vomiting, abdominal discomfort and poor p.o. intake.  In the ED patient afebrile, white blood cell count 19K, lactic acid 13.  Patient hypotensive on arrival.  Cultures were obtained patient received IV fluids and broad-spectrum antibiotics.  CT of the abdomen and pelvis had no acute findings.  Labs showed creatinine 10.9, BUN 87, CO2 8, anion gap 47, K+ 4.2, calcium  10.4.  ABG showed pH 7.01.  Despite fluids patient remained hypotensive and Levophed  drip was started and the critical care team was consulted for admission to ICU.  We are asked to see for renal failure.   Pt seen in ED room.  Patient has no complaints at this time but she is alert and answer simple questions.  History as above.   ROS - denies CP, no joint pain, no HA, no blurry vision, no rash, no diarrhea, no nausea/ vomiting   Past Medical History  Past Medical History:  Diagnosis Date   Diabetes (HCC) 09/2021   Hip pain 2021   Hyperlipidemia    Past Surgical History  Past Surgical History:  Procedure Laterality Date   BREAST REDUCTION SURGERY Bilateral    1990s   CHOLECYSTECTOMY     COLONOSCOPY WITH PROPOFOL  N/A 03/01/2022   Procedure: COLONOSCOPY WITH PROPOFOL ;  Surgeon: San Sandor GAILS, DO;  Location: [facility];  Service: Gastroenterology;  Laterality: N/A;   ENDOSCOPIC MUCOSAL RESECTION N/A 03/01/2022   Procedure: ENDOSCOPIC MUCOSAL RESECTION;  Surgeon: San Sandor GAILS, DO;  Location: [facility];  Service: Gastroenterology;  Laterality: N/A;   FLEXIBLE SIGMOIDOSCOPY N/A 10/13/2022   Procedure: FLEXIBLE SIGMOIDOSCOPY;  Surgeon: San Sandor GAILS, DO;  Location: [facility]  ENDOSCOPY;  Service: Gastroenterology;  Laterality: N/A;   SCLEROTHERAPY  03/01/2022   Procedure: SCLEROTHERAPY;  Surgeon: San Sandor GAILS, DO;  Location: [facility];  Service: Gastroenterology;;   Family History  Family History  Problem Relation Age of Onset   Colon polyps Mother 50   Colon cancer Mother 39   Hypercholesterolemia Father    Diabetes Brother    CVA Brother    CVA Sibling    Crohn's disease Neg Hx    Esophageal cancer Neg Hx    Rectal cancer Neg Hx    Stomach cancer Neg Hx    Ulcerative colitis Neg Hx    Breast cancer Neg Hx    BRCA 1/2 Neg Hx    Social History  reports that she has quit smoking. Her smoking use included cigarettes. She has never been exposed to tobacco smoke. She has never used smokeless tobacco. She reports that she does not drink alcohol and does not use drugs. Allergies Allergies[1] Home medications Prior to Admission medications  Medication Sig Start Date End Date Taking? Authorizing Provider  CONTOUR NEXT TEST test strip USE AS DIRECTED 10/09/23   Tobie Gaines, DO  Cyanocobalamin (VITAMIN B 12 PO) Take 1 tablet by mouth daily. One tablet    [provider]  FLUoxetine  (PROZAC ) 10 MG capsule TAKE 1 CAPSULE BY MOUTH DAILY 10/10/23   Tobie Gaines, DO  gabapentin  (NEURONTIN ) 300 MG capsule TAKE 2 CAPSULES BY MOUTH AT  BEDTIME 09/27/23   Amoako, Prince, MD  insulin  glargine (LANTUS  SOLOSTAR) 100 UNIT/ML Solostar  Pen Inject 10 Units into the skin at bedtime. 06/23/23   Tobie Gaines, DO  Insulin  Pen Needle (BD PEN NEEDLE NANO 2ND GEN) 32G X 4 MM MISC USE 1 EACH DAILY 10/10/23   Tobie Gaines, DO  Microlet Lancets MISC TEST ONCE DAILY 07/21/23   Tobie Gaines, DO  Multiple Vitamins-Minerals (MULTIVITAMIN ADULTS PO) Take 1 tablet by mouth daily. Women over 50    [provider]  ondansetron  (ZOFRAN -ODT) 4 MG disintegrating tablet Take 1 tablet (4 mg total) by mouth every 8 (eight) hours as needed. 02/27/24   Leatrice Vernell HERO, NP   oxyCODONE -acetaminophen  (PERCOCET/ROXICET) 5-325 MG tablet Take 1 tablet by mouth every 6 (six) hours as needed for severe pain (pain score 7-10). Patient not taking: Reported on 10/18/2023 10/05/23   Roseann Adine PARAS., MD  rosuvastatin  (CRESTOR ) 20 MG tablet TAKE 1 TABLET BY MOUTH DAILY 10/10/23   Tobie Gaines, DO  tamsulosin  (FLOMAX ) 0.4 MG CAPS capsule Take 1 capsule (0.4 mg total) by mouth daily. 09/29/23   Smoot, Sarah A, PA-C     Vitals:   03/08/24 1550 03/08/24 1555 03/08/24 1600 03/08/24 1605  BP: 96/66 107/70 101/67 101/68  Pulse: (!) 129 (!) 127 (!) 127 (!) 126  Resp: (!) 21 19 19  (!) 21  Temp:      TempSrc:      SpO2: 99% 100% 100% 100%  Weight:      Height:       Exam Gen alert, no distress, dry mouth Sclera anicteric, throat clear  No jvd or bruits Chest clear bilat to bases RRR no MRG Abd soft ntnd no mass or ascites +bs Ext no LE or UE edema, no other edema Neuro is alert, Ox 3 , nf   Home bp meds: none    Date   Creat  eGFR (ml/min) 2023   0.25 Jul 2022  1.28 >> 0.68 AKI May 2025  0.68 July 2025  0.81- 0.97 > 60 ml/min   03/08/24  10.9  Admit today    UA 12/09 (recent) -> mod bili, ket > 160, neg LE/ nit, mod rbc UA here -> pending Renal US : 12.0/ 12.7 cm kidneys w/o hydronephrosis, no ^echo  CT abd/ pelvis (noncon) ->  Labs today --> Kidney/ bladder/ ureters: 1 to 2 mm left mid kidney nonobstructive renal calculus. No stones in the right kidney or ureters. No hydronephrosis. No perinephric or periureteral stranding. Urinary bladder is unremarkable.     Assessment/ Plan: AKI: b/l creat is 0.8- 0.9 from July 2025, eGFR > 60 ml/min. Creat here is 10.9 today in the setting of recent GI illness w/ n/v and poor po intake for 1-2 weeks. Pt in shock as well. She rec'd 3L LR bolus initially, and had another 1 L LR bolus just now. BPs didn't really improve so levophed  gtt was started. Getting IV abx /w cefepime  and vancomycin . Due to severe metabolic  acidosis w/ persistent hypotension, pt has indication for acute RRT. Have d/w CCM. Will plan for CRRT. Have d/w husband. Will follow.  Metabolic acidosis: due to severe renal failure and hypotension causing lactic acidosis. Continued bicarb gtt, levo gtt and CRRT should help.  DM2: on insulin      Myer Fret  MD [facility] 03/08/2024, 4:45 PM  Recent Labs  Lab 03/08/24 1015  CREATININE 10.90*  K 4.2   Inpatient medications:  Chlorhexidine  Gluconate Cloth  6 each Topical Daily   heparin   5,000 Units Subcutaneous Q8H   insulin  aspart  0-9  Units Subcutaneous Q4H   pantoprazole  (PROTONIX ) IV  40 mg Intravenous Q12H   rosuvastatin   20 mg Oral Daily    [START ON 03/09/2024] ceFEPime  (MAXIPIME ) IV     linezolid  (ZYVOX ) IV     metronidazole      norepinephrine  (LEVOPHED ) Adult infusion 19 mcg/min (03/08/24 1540)   sodium bicarbonate  150 mEq in sterile water  1,150 mL infusion     vasopressin      acetaminophen , docusate sodium , polyethylene glycol      [1]  Allergies Allergen Reactions   Metformin  And Related Other (See Comments)    Lactic acidosis

## 2024-03-08 NOTE — Procedures (Signed)
 Bedside POCUS performed on 03/08/2024.  Indication: Hypotension.  Cardiac: EF normal.  RV size and LV size normal.  No pericardial effusion.  IVC 50% collapsible and 1.3 cm in diameter.  Conclusion: Under volume.  Needs fluid resuscitation.

## 2024-03-08 NOTE — ED Notes (Signed)
 Interrupted from triaging pt to go to another room. IV was obtained and labs. Pt was hooked up to vitals machine before nurse left the room

## 2024-03-08 NOTE — Procedures (Signed)
 Central Venous Catheter Insertion Procedure Note  Joann Campos  [id number]  1955/07/11  Date:03/08/2024  Time:4:38 PM   Provider Performing:Adyline Huberty D Emilio   Procedure: Insertion of Non-tunneled Central Venous Catheter(36556)with US  guidance (23062)    Indication(s) Hemodialysis  Consent Risks of the procedure as well as the alternatives and risks of each were explained to the patient and/or caregiver.  Consent for the procedure was obtained and is signed in the bedside chart  Anesthesia Topical only with 1% lidocaine    Timeout Verified patient identification, verified procedure, site/side was marked, verified correct patient position, special equipment/implants available, medications/allergies/relevant history reviewed, required imaging and test results available.  Sterile Technique Maximal sterile technique including full sterile barrier drape, hand hygiene, sterile gown, sterile gloves, mask, hair covering, sterile ultrasound probe cover (if used).  Procedure Description Area of catheter insertion was cleaned with chlorhexidine  and draped in sterile fashion.   With real-time ultrasound guidance a HD catheter was placed into the right internal jugular vein.  Nonpulsatile blood flow and easy flushing noted in all ports.  The catheter was sutured in place and sterile dressing applied.  Complications/Tolerance None; patient tolerated the procedure well. Chest X-ray is ordered to verify placement for internal jugular or subclavian cannulation.  Chest x-ray is not ordered for femoral cannulation.  EBL Minimal  Specimen(s) None  JD Emilio RIGGERS [facility] 03/08/2024, 4:38 PM  Please see [email or web address] for pager details.  From 7A-7P if no response, please call [phone]. After hours, please call ELink [phone].

## 2024-03-08 NOTE — ED Notes (Addendum)
 Performed occult per MD. Positive occult for vomit MD notified

## 2024-03-08 NOTE — ED Notes (Signed)
 Pt bp 67/42, dr dasie made aware.  Norepinephrine started.  Pt alert and responsive

## 2024-03-08 NOTE — ED Triage Notes (Signed)
 Flu symptoms x2 weeks. Went to urgent care and they told her it was probably flu. N/V past 24 hrs and decided to come in. 88/61 BP 105 HR CBG 87 96% RA A&Ox4. Husband should be here soon

## 2024-03-08 NOTE — Procedures (Signed)
 I have reviewed the CRRT procedure and made adjustments as needed.  Myer Fret MD  [facility] 03/08/2024, 5:01 PM

## 2024-03-08 NOTE — Sepsis Progress Note (Signed)
 Sepsis protocol monitored by eLink

## 2024-03-08 NOTE — ED Notes (Signed)
 Pt transported to CT ?

## 2024-03-08 NOTE — ED Notes (Signed)
 Bladder scan done - 23 ml

## 2024-03-08 NOTE — ED Notes (Signed)
 Pt placed on bedpan

## 2024-03-09 DIAGNOSIS — F419 Anxiety disorder, unspecified: Secondary | ICD-10-CM

## 2024-03-09 LAB — BASIC METABOLIC PANEL WITH GFR
Anion gap: 16 — ABNORMAL HIGH (ref 5–15)
BUN: 31 mg/dL — ABNORMAL HIGH (ref 8–23)
CO2: 22 mmol/L (ref 22–32)
Calcium: 7.7 mg/dL — ABNORMAL LOW (ref 8.9–10.3)
Chloride: 99 mmol/L (ref 98–111)
Creatinine, Ser: 2.84 mg/dL — ABNORMAL HIGH (ref 0.44–1.00)
GFR, Estimated: 18 mL/min — ABNORMAL LOW
Glucose, Bld: 209 mg/dL — ABNORMAL HIGH (ref 70–99)
Potassium: 4.2 mmol/L (ref 3.5–5.1)
Sodium: 137 mmol/L (ref 135–145)

## 2024-03-09 LAB — RENAL FUNCTION PANEL
Albumin: 3.1 g/dL — ABNORMAL LOW (ref 3.5–5.0)
Anion gap: 21 — ABNORMAL HIGH (ref 5–15)
BUN: 51 mg/dL — ABNORMAL HIGH (ref 8–23)
CO2: 23 mmol/L (ref 22–32)
Calcium: 7.5 mg/dL — ABNORMAL LOW (ref 8.9–10.3)
Chloride: 96 mmol/L — ABNORMAL LOW (ref 98–111)
Creatinine, Ser: 4.45 mg/dL — ABNORMAL HIGH (ref 0.44–1.00)
GFR, Estimated: 10 mL/min — ABNORMAL LOW
Glucose, Bld: 144 mg/dL — ABNORMAL HIGH (ref 70–99)
Phosphorus: 2.1 mg/dL — ABNORMAL LOW (ref 2.5–4.6)
Potassium: 3.6 mmol/L (ref 3.5–5.1)
Sodium: 139 mmol/L (ref 135–145)

## 2024-03-09 LAB — RESPIRATORY PANEL BY PCR

## 2024-03-09 LAB — GLUCOSE, CAPILLARY
Glucose-Capillary: 142 mg/dL — ABNORMAL HIGH (ref 70–99)
Glucose-Capillary: 143 mg/dL — ABNORMAL HIGH (ref 70–99)
Glucose-Capillary: 192 mg/dL — ABNORMAL HIGH (ref 70–99)
Glucose-Capillary: 179 mg/dL — ABNORMAL HIGH (ref 70–99)
Glucose-Capillary: 175 mg/dL — ABNORMAL HIGH (ref 70–99)

## 2024-03-09 LAB — LACTIC ACID, PLASMA: Lactic Acid, Venous: 1.1 mmol/L (ref 0.5–1.9)

## 2024-03-09 LAB — BLOOD GAS, VENOUS
Acid-Base Excess: 1.2 mmol/L (ref 0.0–2.0)
Bicarbonate: 26 mmol/L (ref 20.0–28.0)
O2 Saturation: 65.9 %
Patient temperature: 35.6
pCO2, Ven: 38 mmHg — ABNORMAL LOW (ref 44–60)
pH, Ven: 7.43 (ref 7.25–7.43)
pO2, Ven: 34 mmHg (ref 32–45)

## 2024-03-09 LAB — CBC
HCT: 27.4 % — ABNORMAL LOW (ref 36.0–46.0)
Hemoglobin: 9.5 g/dL — ABNORMAL LOW (ref 12.0–15.0)
MCH: 27.9 pg (ref 26.0–34.0)
MCHC: 34.7 g/dL (ref 30.0–36.0)
MCV: 80.6 fL (ref 80.0–100.0)
Platelets: 421 K/uL — ABNORMAL HIGH (ref 150–400)
RBC: 3.4 MIL/uL — ABNORMAL LOW (ref 3.87–5.11)
RDW: 13.3 % (ref 11.5–15.5)
WBC: 20.9 K/uL — ABNORMAL HIGH (ref 4.0–10.5)
nRBC: 0 % (ref 0.0–0.2)

## 2024-03-09 LAB — MAGNESIUM: Magnesium: 1.9 mg/dL (ref 1.7–2.4)

## 2024-03-09 LAB — PHOSPHORUS: Phosphorus: 2.1 mg/dL — ABNORMAL LOW (ref 2.5–4.6)

## 2024-03-09 MED ORDER — BIOTENE DRY MOUTH MT LIQD: 15.0000 mL | OROMUCOSAL | Status: DC | PRN

## 2024-03-09 MED ORDER — ENSURE PLUS HIGH PROTEIN PO LIQD
237.0000 mL | Freq: Three times a day (TID) | ORAL | Status: DC
  Administered 2024-03-09: 237 mL via ORAL

## 2024-03-09 MED ORDER — SODIUM CHLORIDE 0.9 % IV SOLN: 2.0000 g | INTRAVENOUS | Status: DC

## 2024-03-09 MED ORDER — RENA-VITE PO TABS
1.0000 | ORAL_TABLET | Freq: Two times a day (BID) | ORAL | Status: DC
  Administered 2024-03-09 – 2024-03-12 (×4): 1 via ORAL
  Filled 2024-03-09 (×6): qty 1

## 2024-03-09 MED ORDER — ZINC SULFATE 220 (50 ZN) MG PO CAPS
220.0000 mg | ORAL_CAPSULE | Freq: Every day | ORAL | Status: DC
  Administered 2024-03-09 – 2024-03-14 (×5): 220 mg via ORAL
  Filled 2024-03-09 (×5): qty 1

## 2024-03-09 MED ORDER — PROSOURCE PLUS PO LIQD
30.0000 mL | Freq: Two times a day (BID) | ORAL | Status: DC
  Administered 2024-03-09: 30 mL via ORAL
  Filled 2024-03-09 (×3): qty 30

## 2024-03-09 MED ORDER — THIAMINE MONONITRATE 100 MG PO TABS
100.0000 mg | ORAL_TABLET | Freq: Every day | ORAL | Status: DC
  Administered 2024-03-09 – 2024-03-14 (×5): 100 mg via ORAL
  Filled 2024-03-09 (×5): qty 1

## 2024-03-09 MED ORDER — POTASSIUM PHOSPHATES 15 MMOLE/5ML IV SOLN
15.0000 mmol | Freq: Once | INTRAVENOUS | Status: AC
  Administered 2024-03-09: 15 mmol via INTRAVENOUS
  Filled 2024-03-09: qty 5

## 2024-03-09 MED ORDER — SODIUM CHLORIDE 0.9 % IV SOLN
350.0000 [IU]/h | INTRAVENOUS | Status: DC
  Administered 2024-03-09: 350 [IU]/h via INTRAVENOUS_CENTRAL
  Filled 2024-03-09: qty 10000

## 2024-03-09 MED ORDER — SODIUM CHLORIDE 0.9 % IV SOLN: INTRAVENOUS | Status: AC

## 2024-03-09 MED ORDER — LACTATED RINGERS IV BOLUS
1000.0000 mL | Freq: Once | INTRAVENOUS | Status: AC
  Administered 2024-03-09: 1000 mL via INTRAVENOUS

## 2024-03-09 NOTE — Plan of Care (Signed)
" °  Problem: Coping: Goal: Ability to adjust to condition or change in health will improve Outcome: Progressing   Problem: Fluid Volume: Goal: Ability to maintain a balanced intake and output will improve Outcome: Progressing   Problem: Nutritional: Goal: Maintenance of adequate nutrition will improve Outcome: Not Progressing   "

## 2024-03-09 NOTE — Progress Notes (Signed)
 "  NAME:  Joann Campos, MRN:  [id number], DOB:  23-Sep-1955, LOS: 1 ADMISSION DATE:  03/08/2024, CONSULTATION DATE:  12/19 REFERRING MD:  Dr. Dasie, CHIEF COMPLAINT:  aki and septic shock   History of Present Illness:  Patient is a 68 yo F w/ pertinent PMH DMT2, HLD, peripheral neuropathy, anxiety presents to [facility] ED on 12/19 w/ septic shock and aki.  For the past 2 weeks patient has had N/V, generalized mild abd discomfort, poor po intake. Denies fever, diarrhea or constipation. Was seen by urgent care on 12/9 and given fluids. Thought related to viral illness educated to push po fluids. Symptoms persists and came to [facility] ED on 12/19 for further workup. On arrival hypotensive. Afebrile and wbc 19. LA 13. Cultures obtained, given fluids, and started on broad spectrum abx. CT abd/pelvis w/ no acute findings; chronic bibasilar atelectasis, 1-2 mm nonobstructive left renal calculus. Creat 10.9, bun 87, co2 8, AG 47, k 4.2, calcium  10.4. VBG 7.01, 27, 46, 6.8. Nephro consulted. Despite fluids remained hypotensive and started on levo. Pccm consulted for icu admission.  Pertinent  Medical History   Past Medical History:  Diagnosis Date   Diabetes (HCC) 09/2021   Hip pain 2021   Hyperlipidemia      Significant Hospital Events: Including procedures, antibiotic start and stop dates in addition to other pertinent events   12/19 septic shock on levo and aki starting crrt 12/20 Patient weaning pressor however her pressor start back again in the afternoon from 2 to 10. She has polyurea and she is on fluids.  Interim History / Subjective:  See above  Objective    Blood pressure (!) 79/48, pulse 94, temperature 98.6 F (37 C), temperature source Oral, resp. rate 16, height 5' 5 (1.651 m), weight 64.6 kg, SpO2 96%.        Intake/Output Summary (Last 24 hours) at 03/09/2024 1459 Last data filed at 03/09/2024 1400 Gross per 24 hour  Intake 7589.49 ml  Output 3159 ml  Net 4430.49 ml   Filed  Weights   03/08/24 1001 03/08/24 1700 03/09/24 0500  Weight: 59 kg 63 kg 64.6 kg    Examination: General:  NAD, alert and oriented HEENT: MM pink/moist Neuro: Aox3; MAE CV: s1s2, RRR, no m/r/g PULM:  dim clear BS bilaterally GI: soft, bsx4 active  Extremities: warm/dry, no edema  Skin: no rashes or lesions    Resolved problem list   Assessment and Plan   Shock: hypovolemic and/or Sepsis -possible gastroenteritis; poor po intake past 2 weeks Plan: -levo for map goal >65 -cont broad spectrum abx for now: linezolid +cefepime +metro  -MRSA neg> I may discontinue linezolid  tmw. -follow cultures, ua pending. -CT a/p was neg for acute findings. Bibasilar subsegmental atelectasis. 1-2 mm nonobstructive left renal calculus. -trend la -trend wbc/fever curve -send gi panel and rvp  AKI on CRRT improving patient denies any new medications and denies any ibuprofen use AGMA resolved Lactic acidosis resolved On CRRT Possible metformin  toxicity induced lactic acidosis Plan: - On CRRT. LA normal today.  -renal us  was normal -hx of previous kidney stones  -Trend BMP / urinary output -Replace electrolytes as indicated -Avoid nephrotoxic agents, ensure adequate renal perfusion  Prolonged qtc Plan: -check mag -tele monitoring -hold home psych meds -avoid qtc prolonging agents  DMT2 Plan: -ssi and cbg monitoring -a1c  HLD Plan: -statin  Peripheral neuropathy Plan: -hold gabapentin   Anxiety Plan: -hold prozac    Best Practice (right click and Reselect all SmartList Selections daily)  Diet/type: NPO DVT prophylaxis: prophylactic heparin   GI prophylaxis: PPI Lines: Dialysis Catheter Foley:  Yes, and it is still needed Code Status:  full code Last date of multidisciplinary goals of care discussion [12/19 updated patient and husband at bedside]     Labs   CBC: Recent Labs  Lab 03/08/24 1015 03/09/24 0508  WBC 19.6* 20.9*  NEUTROABS 16.2*  --   HGB 13.2  9.5*  HCT 41.8 27.4*  MCV 89.9 80.6  PLT 549* 421*    Basic Metabolic Panel: Recent Labs  Lab 03/08/24 1015 03/08/24 1816 03/09/24 0236 03/09/24 0237 03/09/24 1207  NA 145  --   --  139 137  K 4.2  --   --  3.6 4.2  CL 90*  --   --  96* 99  CO2 8*  --   --  23 22  GLUCOSE 90  --   --  144* 209*  BUN 87*  --   --  51* 31*  CREATININE 10.90*  --   --  4.45* 2.84*  CALCIUM  10.4*  --   --  7.5* 7.7*  MG  --  2.1 1.9  --   --   PHOS  --   --  2.1* 2.1*  --    GFR: Estimated Creatinine Clearance: 17.5 mL/min (A) (by C-G formula based on SCr of 2.84 mg/dL (H)). Recent Labs  Lab 03/08/24 1015 03/08/24 1332 03/08/24 1816 03/08/24 2230 03/09/24 0508 03/09/24 0939  PROCALCITON  --   --  1.42  --   --   --   WBC 19.6*  --   --   --  20.9*  --   LATICACIDVEN  --  13.6* >9.0* 5.8*  --  1.1    Liver Function Tests: Recent Labs  Lab 03/08/24 1015 03/09/24 0237  AST 45*  --   ALT 32  --   ALKPHOS 99  --   BILITOT 0.3  --   PROT 7.4  --   ALBUMIN 4.2 3.1*   Recent Labs  Lab 03/08/24 1015  LIPASE 64*   No results for input(s): AMMONIA in the last 168 hours.  ABG    Component Value Date/Time   HCO3 26.0 03/09/2024 0937   ACIDBASEDEF 23.1 (H) 03/08/2024 1405   O2SAT 65.9 03/09/2024 0937     Coagulation Profile: No results for input(s): INR, PROTIME in the last 168 hours.  Cardiac Enzymes: No results for input(s): CKTOTAL, CKMB, CKMBINDEX, TROPONINI in the last 168 hours.  HbA1C: Hemoglobin A1C  Date/Time Value Ref Range Status  07/25/2023 08:41 AM 6.8 (A) 4.0 - 5.6 % Final  08/05/2022 09:39 AM 7.6 (A) 4.0 - 5.6 % Final   Hgb A1c MFr Bld  Date/Time Value Ref Range Status  03/08/2024 06:16 PM 6.8 (H) 4.8 - 5.6 % Final    Comment:    (NOTE) Diagnosis of Diabetes The following HbA1c ranges recommended by the American Diabetes Association (ADA) may be used as an aid in the diagnosis of diabetes mellitus.  Hemoglobin              Suggested A1C NGSP%              Diagnosis  <5.7                   Non Diabetic  5.7-6.4                Pre-Diabetic  >6.4  Diabetic  <7.0                   Glycemic control for                       adults with diabetes.    09/29/2021 12:01 PM 12.9 (H) 4.8 - 5.6 % Final    Comment:             Prediabetes: 5.7 - 6.4          Diabetes: >6.4          Glycemic control for adults with diabetes: <7.0     CBG: Recent Labs  Lab 03/08/24 2019 03/08/24 2320 03/09/24 0319 03/09/24 0839 03/09/24 1216  GLUCAP 252* 211* 142* 143* 192*    Review of Systems:   As above   Past Medical History:  She,  has a past medical history of Diabetes (HCC) (09/2021), Hip pain (2021), and Hyperlipidemia.   Surgical History:   Past Surgical History:  Procedure Laterality Date   BREAST REDUCTION SURGERY Bilateral    1990s   CHOLECYSTECTOMY     COLONOSCOPY WITH PROPOFOL  N/A 03/01/2022   Procedure: COLONOSCOPY WITH PROPOFOL ;  Surgeon: San Sandor GAILS, DO;  Location: [facility];  Service: Gastroenterology;  Laterality: N/A;   ENDOSCOPIC MUCOSAL RESECTION N/A 03/01/2022   Procedure: ENDOSCOPIC MUCOSAL RESECTION;  Surgeon: San Sandor GAILS, DO;  Location: [facility];  Service: Gastroenterology;  Laterality: N/A;   FLEXIBLE SIGMOIDOSCOPY N/A 10/13/2022   Procedure: FLEXIBLE SIGMOIDOSCOPY;  Surgeon: San Sandor GAILS, DO;  Location: [facility];  Service: Gastroenterology;  Laterality: N/A;   SCLEROTHERAPY  03/01/2022   Procedure: SCLEROTHERAPY;  Surgeon: San Sandor GAILS, DO;  Location: [facility];  Service: Gastroenterology;;     Social History:   reports that she has quit smoking. Her smoking use included cigarettes. She has never been exposed to tobacco smoke. She has never used smokeless tobacco. She reports that she does not drink alcohol and does not use drugs.   Family History:  Her family history includes CVA in her brother and sibling; Colon cancer (age  of onset: 6) in her mother; Colon polyps (age of onset: 43) in her mother; Diabetes in her brother; Hypercholesterolemia in her father. There is no history of Crohn's disease, Esophageal cancer, Rectal cancer, Stomach cancer, Ulcerative colitis, Breast cancer, or BRCA 1/2.   Allergies Allergies[1]   Home Medications  Prior to Admission medications  Medication Sig Start Date End Date Taking? Authorizing Provider  CONTOUR NEXT TEST test strip USE AS DIRECTED 10/09/23   Tobie Gaines, DO  Cyanocobalamin (VITAMIN B 12 PO) Take 1 tablet by mouth daily. One tablet    [provider]  FLUoxetine  (PROZAC ) 10 MG capsule TAKE 1 CAPSULE BY MOUTH DAILY 10/10/23   Tobie Gaines, DO  gabapentin  (NEURONTIN ) 300 MG capsule TAKE 2 CAPSULES BY MOUTH AT  BEDTIME 09/27/23   Renne Homans, MD  insulin  glargine (LANTUS  SOLOSTAR) 100 UNIT/ML Solostar Pen Inject 10 Units into the skin at bedtime. 06/23/23   Tobie Gaines, DO  Insulin  Pen Needle (BD PEN NEEDLE NANO 2ND GEN) 32G X 4 MM MISC USE 1 EACH DAILY 10/10/23   Tobie Gaines, DO  metFORMIN  (GLUCOPHAGE -XR) 500 MG 24 hr tablet TAKE 2 TABLETS BY MOUTH TWICE  DAILY WITH A MEAL 07/13/23   Tobie Gaines, DO  Microlet Lancets MISC TEST ONCE DAILY 07/21/23   Tobie Gaines, DO  Multiple Vitamins-Minerals (MULTIVITAMIN ADULTS PO) Take 1  tablet by mouth daily. Women over 50    [provider]  ondansetron  (ZOFRAN -ODT) 4 MG disintegrating tablet Take 1 tablet (4 mg total) by mouth every 8 (eight) hours as needed. 02/27/24   Leatrice Vernell HERO, NP  oxyCODONE -acetaminophen  (PERCOCET/ROXICET) 5-325 MG tablet Take 1 tablet by mouth every 6 (six) hours as needed for severe pain (pain score 7-10). Patient not taking: Reported on 10/18/2023 10/05/23   Roseann Adine PARAS., MD  rosuvastatin  (CRESTOR ) 20 MG tablet TAKE 1 TABLET BY MOUTH DAILY 10/10/23   Tobie Gaines, DO  tamsulosin  (FLOMAX ) 0.4 MG CAPS capsule Take 1 capsule (0.4 mg total) by mouth daily. 09/29/23   Smoot, Lauraine LABOR, PA-C      Critical care time: 45 minutes    Marny patch, MD [facility] 03/09/2024, 2:59 PM  Please see [email or web address] for pager details.  From 7A-7P if no response, please call [phone] After hours, please call ELink [phone].          [1]  Allergies Allergen Reactions   Metformin  And Related Other (See Comments)    Lactic acidosis   "

## 2024-03-09 NOTE — Plan of Care (Signed)
" °  Problem: Fluid Volume: Goal: Ability to maintain a balanced intake and output will improve Outcome: Progressing   Problem: Metabolic: Goal: Ability to maintain appropriate glucose levels will improve Outcome: Progressing   Problem: Nutritional: Goal: Maintenance of adequate nutrition will improve Outcome: Progressing   Problem: Skin Integrity: Goal: Risk for impaired skin integrity will decrease Outcome: Progressing   Problem: Tissue Perfusion: Goal: Adequacy of tissue perfusion will improve Outcome: Progressing   Problem: Clinical Measurements: Goal: Ability to maintain clinical measurements within normal limits will improve Outcome: Progressing Goal: Diagnostic test results will improve Outcome: Progressing   Problem: Coping: Goal: Level of anxiety will decrease Outcome: Progressing   Problem: Elimination: Goal: Will not experience complications related to urinary retention Outcome: Progressing   Problem: Pain Managment: Goal: General experience of comfort will improve and/or be controlled Outcome: Progressing   "

## 2024-03-09 NOTE — Progress Notes (Signed)
 Pharmacy Electrolyte Replacement  Recent Labs:  Recent Labs    03/09/24 0236 03/09/24 0237  K  --  3.6  MG 1.9  --   PHOS 2.1* 2.1*  CREATININE  --  4.45*    Low Critical Values (K </= 2.5, Phos </= 1, Mg </= 1) Present: Phos = 2.1  MD Contacted: Adrien Guan and [place]  Plan: KPhos 15mmol IV x 1  Hilton Saephan Karoline Marina, PharmD, BCPS Clinical Staff Pharmacist

## 2024-03-09 NOTE — Progress Notes (Addendum)
 Initial Nutrition Assessment  DOCUMENTATION CODES:   Not applicable  INTERVENTION:   Continue GI Soft diet given recent N/V/abdominal pain.  Pt may benefit from small, frequent meals  Close monitoring of electrolytes (in particular phosphorus) while on CRRT with supplementation as needed Phos Supplementation per CRRT Phos Protocol  Ensure Plus High Protein po TID, follow for tolerance given GI issues on admission. Each supplement provides 350 kcal and 20 grams of protein  Add 30 ml ProSource Plus BID, each supplement provides 100 kcals and 15 grams protein. Please provide beverage of choice (water , juice, Ensure, etc) with the Pro-Source. Pt can drink beverage post administration of Pro-Source if helps with palatability. Ok to [employer] in beverage as well if pt prefers  Recommend considering Biotene or other dry mouth mouthwash   Add Renal MVI BID (increased dose given pt on oral diet with poor oral intake and increased losses via CRRT) Add Thiamine  100 mg daily  Add Zinc  220 mg daily x 14 days given significant GI losses  NUTRITION DIAGNOSIS:   Inadequate oral intake related to nausea, vomiting, acute illness, poor appetite as evidenced by per patient/family report.  GOAL:   Patient will meet greater than or equal to 90% of their needs  MONITOR:   PO intake, Supplement acceptance, I & O's, Labs, Weight trends  REASON FOR ASSESSMENT:   Consult Assessment of nutrition requirement/status (CRRT)  ASSESSMENT:   68 yo female presented to [facility] ED with 2 weeks of N/V, abdominal pain and poor po intake and admitted with severe pre-renal AKI secondary to dehydration with severe metabolic acidosis requiring CRRT, hypovolemic shock. PMH includes DM, peripheral neuropathy, HLD, anxiety  12/19 Admitted, severe pre-renal AKI secondary to dehydration, CRRT initiated 12/20 Diet advanced to Soft in AM  Currently on CRRT (CVVH), 4K+ all fluids. Hypovolemic, noted orders to keep  +150 ml/hr with CRRT (providing IV fluids) Net +7 L in 24 hours UOP 1430 mL yesterday with 900 mL so far today  NS at 150 ml/hr x 24 hours started today at 0800. Received 1L bolus LR x 3 yesterday (total of 8 L fluid received)  Baseline Creatinine around 0.8-0.9. Creatinie on admission 10.9, already improved to 4.45 this AM KPhos 15 mmol x 1 today. Phosphorus 2.1 (L). Noted CRRT Phos Protocol initiated  Lactic Acid now normal; >9 initially  Noted this AM, pt very thirsty with dry mouth. Diet advanced to Soft, from NPO, this AM. Noted report of poor appetite and poor po intake for 1-2 weeks PTA  RD unable to obtain diet and weight history from pt at this time. Spoke with RN who indicates pt tolerated some toast and water  this AM. Appetite remains poor. No N/V currently  Noted GI stool study pending, no stool since admission. Abdomen soft, BS present  Current Wt 64.6 kg Admit Wt on 12/19: 59 kg at 1000 am and 63 kg on 1700.  Unclear which one is accurate or if both are accurate (increase in wt related to fluid? Or first wt not measured) UBW: unknown  Labs: Sodium 139 (wdl) Potassium 3.6 (wdl) Phosphorus 2.1 (L) Magnesium  1.9 (wdl) Corrected Calcium  8.2- L, consider checking ionized calcium . (Serum calcium  7.5, albumin 3.1)  Meds: SS Novolog  IV Flagyl  IV protonix   NUTRITION - FOCUSED PHYSICAL EXAM:  Assess on follow-up as able  Diet Order:   Diet Order             DIET SOFT Room service appropriate? Yes; Fluid consistency: Thin  Diet  effective now                   EDUCATION NEEDS:   Not appropriate for education at this time  Skin:  Skin Assessment: Reviewed RN Assessment  Last BM:  PTA  Height:   Ht Readings from Last 1 Encounters:  03/08/24 5' 5 (1.651 m)    Weight:   Wt Readings from Last 1 Encounters:  03/09/24 64.6 kg    BMI:  Body mass index is 23.7 kg/m.  Estimated Nutritional Needs:   Kcal:  1700-1900 kcals  Protein:  85-105  g  Fluid:  >/= 2L   Joann Finger MS, RDN, LDN, CNSC Registered Dietitian 3 Clinical Nutrition RD Inpatient Contact Info in Amion

## 2024-03-09 NOTE — Progress Notes (Addendum)
 [facility] Progress Note  Subjective:  Seen in room Still thirsty, dry mouth Creat down 4.5 today I/O + 6.9 L  UOP 1430 cc overnight  Presentation summary: 68 y.o. year-old w/ PMH of diabetes, HL, peripheral neuropathy, anxiety who presented to ED 1219 complaining of 2 weeks of nausea, vomiting, abdominal discomfort and poor p.o. intake.  In the ED patient afebrile, white blood cell count 19K, lactic acid 13.  Patient hypotensive on arrival.  Cultures were obtained patient received IV fluids and broad-spectrum antibiotics.  CT of the abdomen and pelvis had no acute findings.  Labs showed creatinine 10.9, BUN 87, CO2 8, anion gap 47, K+ 4.2, calcium  10.4.  ABG showed pH 7.01.  Despite fluids patient remained hypotensive and Levophed  drip was started and the critical care team was consulted for admission to ICU.  We are asked to see for renal failure.   Vitals:   03/09/24 0615 03/09/24 0630 03/09/24 0645 03/09/24 0700  BP: (!) 144/80 125/81 (!) 149/105 119/64  Pulse: (!) 110 (!) 108 (!) 106 (!) 104  Resp: 20 17 18 16   Temp:      TempSrc:      SpO2: 97% 92% 95% 92%  Weight:      Height:        Exam: Gen alert, no distress, dry mouth Sclera anicteric, throat clear  No jvd or bruits Chest clear bilat to bases RRR no MRG Abd soft ntnd no mass or ascites +bs Ext no LE or UE edema, no other edema Neuro is alert, Ox 3 , nf     Home bp meds: none       Date                             Creat               eGFR (ml/min) 2023                            0.25 Jul 2022                    1.28 >> 0.68    AKI May 2025                    0.68 July 2025                     0.81- 0.97        > 60 ml/min      03/08/24                      10.9                 Admit today       UA 12/09 (recent) -> mod bili, ket > 160, neg LE/ nit, mod rbc UA here -> pending Renal US : 12.0/ 12.7 cm kidneys w/o hydronephrosis, no ^echo   CT abd/ pelvis (noncon) ->  Labs today --> Kidney/  bladder/ ureters: 1 to 2 mm left mid kidney nonobstructive renal calculus. No stones in the right kidney or ureters. No hydronephrosis. No perinephric or periureteral stranding. Urinary bladder is unremarkable.         Assessment/ Plan: AKI: b/l creat is 0.8- 0.9 from July 2025, eGFR > 60 ml/min. Creat here is 10.9 today in the setting of  recent GI illness w/ n/v and poor po intake for 1-2 weeks. Pt was in shock. She rec'd multiple liters of IVF's. BPs didn't improve so levophed  gtt was started. Getting IV abx /w cefepime  and vancomycin . CRRT was started yest afternoon. Pt rec'd 8 L IVF's total yesterday. Good UOP at 1400 overnight. Still looks dry on exam. Will keep +150 cc/hr w/ CRRT.  When pressors are weaned off and BP is stable we can try stopping CRRT. Will follow.  Metabolic acidosis: due to severe renal failure and hypotension causing lactic acidosis. Resolved today, will change IVF's to NS 0.9%.  DM2: on insulin    Addendum 4:40 pm: very good UOP and vasopressors coming down, LA normalized. Will dc CRRT. Cont IVF's at 125 cc/hr.           Myer Fret MD  [facility] 03/09/2024, 7:13 AM  Recent Labs  Lab 03/08/24 1015 03/09/24 0236 03/09/24 0237  HGB 13.2  --   --   ALBUMIN 4.2  --  3.1*  CALCIUM  10.4*  --  7.5*  PHOS  --  2.1* 2.1*  CREATININE 10.90*  --  4.45*  K 4.2  --  3.6   No results for input(s): IRON, TIBC, FERRITIN in the last 168 hours. Inpatient medications:  Chlorhexidine  Gluconate Cloth  6 each Topical Daily   heparin   5,000 Units Subcutaneous Q8H   insulin  aspart  0-9 Units Subcutaneous Q4H   pantoprazole  (PROTONIX ) IV  40 mg Intravenous Q12H   rosuvastatin   20 mg Oral Daily    sodium chloride      ceFEPime  (MAXIPIME ) IV Stopped (03/09/24 0339)   linezolid  (ZYVOX ) IV Stopped (03/08/24 2315)   metronidazole  Stopped (03/08/24 2213)   norepinephrine  (LEVOPHED ) Adult infusion 6 mcg/min (03/09/24 0700)   prismasol  BGK 4/2.5 400 mL/hr at 03/08/24 1903    prismasol  BGK 4/2.5 400 mL/hr at 03/08/24 1903   prismasol  BGK 4/2.5 1,500 mL/hr at 03/09/24 0207   vasopressin  0.03 Units/min (03/09/24 0700)   acetaminophen , docusate sodium , heparin , mouth rinse, polyethylene glycol

## 2024-03-09 NOTE — Progress Notes (Signed)
 Very good UOP and vasopressors coming down this afternoon. LA has normalized. Will dc CRRT. Cont IVF's at 125 cc/hr.    Myer Fret  MD  [facility] 03/09/2024, 4:42 PM

## 2024-03-10 ENCOUNTER — Inpatient Hospital Stay (HOSPITAL_COMMUNITY)

## 2024-03-10 DIAGNOSIS — E861 Hypovolemia: Secondary | ICD-10-CM

## 2024-03-10 LAB — BASIC METABOLIC PANEL WITH GFR
Anion gap: 12 (ref 5–15)
Anion gap: 18 — ABNORMAL HIGH (ref 5–15)
BUN: 23 mg/dL (ref 8–23)
BUN: 24 mg/dL — ABNORMAL HIGH (ref 8–23)
CO2: 24 mmol/L (ref 22–32)
CO2: 21 mmol/L — ABNORMAL LOW (ref 22–32)
Calcium: 7 mg/dL — ABNORMAL LOW (ref 8.9–10.3)
Calcium: 6.9 mg/dL — ABNORMAL LOW (ref 8.9–10.3)
Chloride: 104 mmol/L (ref 98–111)
Chloride: 103 mmol/L (ref 98–111)
Creatinine, Ser: 2.61 mg/dL — ABNORMAL HIGH (ref 0.44–1.00)
Creatinine, Ser: 2.71 mg/dL — ABNORMAL HIGH (ref 0.44–1.00)
GFR, Estimated: 20 mL/min — ABNORMAL LOW
GFR, Estimated: 19 mL/min — ABNORMAL LOW
Glucose, Bld: 147 mg/dL — ABNORMAL HIGH (ref 70–99)
Glucose, Bld: 140 mg/dL — ABNORMAL HIGH (ref 70–99)
Potassium: 3.2 mmol/L — ABNORMAL LOW (ref 3.5–5.1)
Potassium: 2.6 mmol/L — CL (ref 3.5–5.1)
Sodium: 140 mmol/L (ref 135–145)
Sodium: 142 mmol/L (ref 135–145)

## 2024-03-10 LAB — CBC
HCT: 26.9 % — ABNORMAL LOW (ref 36.0–46.0)
Hemoglobin: 9.3 g/dL — ABNORMAL LOW (ref 12.0–15.0)
MCH: 28.4 pg (ref 26.0–34.0)
MCHC: 34.6 g/dL (ref 30.0–36.0)
MCV: 82 fL (ref 80.0–100.0)
Platelets: 250 K/uL (ref 150–400)
RBC: 3.28 MIL/uL — ABNORMAL LOW (ref 3.87–5.11)
RDW: 13.5 % (ref 11.5–15.5)
WBC: 12.5 K/uL — ABNORMAL HIGH (ref 4.0–10.5)
nRBC: 0 % (ref 0.0–0.2)

## 2024-03-10 LAB — GLUCOSE, CAPILLARY
Glucose-Capillary: 136 mg/dL — ABNORMAL HIGH (ref 70–99)
Glucose-Capillary: 146 mg/dL — ABNORMAL HIGH (ref 70–99)
Glucose-Capillary: 146 mg/dL — ABNORMAL HIGH (ref 70–99)
Glucose-Capillary: 156 mg/dL — ABNORMAL HIGH (ref 70–99)
Glucose-Capillary: 164 mg/dL — ABNORMAL HIGH (ref 70–99)
Glucose-Capillary: 173 mg/dL — ABNORMAL HIGH (ref 70–99)

## 2024-03-10 LAB — PARATHYROID HORMONE, INTACT (NO CA): PTH: 85 pg/mL — ABNORMAL HIGH (ref 15–65)

## 2024-03-10 MED ORDER — SODIUM CHLORIDE 0.9 % IV SOLN: INTRAVENOUS | Status: AC

## 2024-03-10 MED ORDER — POTASSIUM CHLORIDE 10 MEQ/50ML IV SOLN
10.0000 meq | INTRAVENOUS | Status: AC
  Administered 2024-03-10 (×4): 10 meq via INTRAVENOUS
  Filled 2024-03-10 (×4): qty 50

## 2024-03-10 MED ORDER — SODIUM CHLORIDE 0.9 % IV SOLN
1.0000 g | INTRAVENOUS | Status: DC
  Administered 2024-03-10 – 2024-03-14 (×5): 1 g via INTRAVENOUS
  Filled 2024-03-10 (×5): qty 10

## 2024-03-10 MED ORDER — POTASSIUM CHLORIDE 20 MEQ PO PACK
60.0000 meq | PACK | Freq: Two times a day (BID) | ORAL | Status: DC
  Filled 2024-03-10: qty 3

## 2024-03-10 MED ORDER — FAMOTIDINE 20 MG PO TABS
20.0000 mg | ORAL_TABLET | Freq: Every day | ORAL | Status: DC
  Administered 2024-03-10 – 2024-03-14 (×4): 20 mg via ORAL
  Filled 2024-03-10 (×4): qty 1

## 2024-03-10 MED ORDER — MAGNESIUM SULFATE 2 GM/50ML IV SOLN
2.0000 g | Freq: Once | INTRAVENOUS | Status: AC
  Administered 2024-03-10: 2 g via INTRAVENOUS
  Filled 2024-03-10: qty 50

## 2024-03-10 NOTE — Progress Notes (Signed)
 [facility] Progress Note  Subjective:  Seen in room BP's more stable > 100 and off pressor support 4.6 L in and 2.3 L UOP yesterday Creat 2.8 at noon yest, CRRT dc'd early evening, this am creat 2.6   Presentation summary: 68 y.o. year-old w/ PMH of diabetes, HL, peripheral neuropathy, anxiety who presented to ED 1219 complaining of 2 weeks of nausea, vomiting, abdominal discomfort and poor p.o. intake.  In the ED patient afebrile, white blood cell count 19K, lactic acid 13.  Patient hypotensive on arrival.  Cultures were obtained patient received IV fluids and broad-spectrum antibiotics.  CT of the abdomen and pelvis had no acute findings.  Labs showed creatinine 10.9, BUN 87, CO2 8, anion gap 47, K+ 4.2, calcium  10.4.  ABG showed pH 7.01.  Despite fluids patient remained hypotensive and Levophed  drip was started and the critical care team was consulted for admission to ICU.  We are asked to see for renal failure.   Vitals:   03/10/24 0545 03/10/24 0600 03/10/24 0615 03/10/24 0630  BP: 133/78 124/60 116/65 106/73  Pulse: (!) 105 96 93 (!) 108  Resp: (!) 22 15 13 18   Temp:      TempSrc:      SpO2: 95% 95% 95% 96%  Weight:      Height:        Exam: Gen alert, no distress, dry mouth Sclera anicteric, throat clear  No jvd or bruits Chest clear bilat to bases RRR no MRG Abd soft ntnd no mass or ascites +bs Ext no LE or UE edema, no other edema Neuro is alert, Ox 3 , nf     Home bp meds: none   Date                             Creat               eGFR (ml/min) 2023                            0.25 Jul 2022                    1.28 >> 0.68    AKI May 2025                    0.68 July 2025                     0.81- 0.97        > 60 ml/min      03/08/24                      10.9                 Admit today       UA 12/09 (recent) -> mod bili, ket > 160, neg LE/ nit, mod rbc UA here -> pending Renal US : 12.0/ 12.7 cm kidneys w/o hydronephrosis, no ^echo   CT  abd/ pelvis (noncon) ->  Labs today --> Kidney/ bladder/ ureters: 1 to 2 mm left mid kidney nonobstructive renal calculus. No stones in the right kidney or ureters. No hydronephrosis. No perinephric or periureteral stranding. Urinary bladder is unremarkable.         Assessment/ Plan: AKI: b/l creat is 0.8- 0.9 from July 2025, eGFR > 60 ml/min. Creat here  is 10.9 today in the setting of recent GI illness w/ n/v/ poor po intake for 2 weeks. Pt was in shock. She rec'd multiple liters of IVF's. BPs didn't improve so levophed  gtt was started and IV abx. CRRT started 12/19. Pt rec'd 8 L IVF's 12/19 and 4.7 L 12/20. This am finally appears hydrated.  Good UOP yesterday and the day before, pressors weaned off yesterday afternoon and so CRRT was dc'd. Suspect renal function is recovering. Cont bid labs for now, lower IVFs to 75 cc/hr. Will follow.  Metabolic acidosis: due to renal failure and hypotension w/ severe lactic acidosis. Resolved w/ bicarb IVF's and CRRT.  DM2: on insulin    Myer Fret MD  [facility] 03/10/2024, 6:51 AM  Recent Labs  Lab 03/08/24 1015 03/09/24 0236 03/09/24 0237 03/09/24 0508 03/09/24 1207 03/10/24 0322  HGB 13.2  --   --  9.5*  --  9.3*  ALBUMIN 4.2  --  3.1*  --   --   --   CALCIUM  10.4*  --  7.5*  --  7.7* 7.0*  PHOS  --  2.1* 2.1*  --   --   --   CREATININE 10.90*  --  4.45*  --  2.84* 2.61*  K 4.2  --  3.6  --  4.2 3.2*   No results for input(s): IRON, TIBC, FERRITIN in the last 168 hours. Inpatient medications:  (feeding supplement) PROSource Plus  30 mL Oral BID   Chlorhexidine  Gluconate Cloth  6 each Topical Daily   feeding supplement  237 mL Oral TID BM   heparin   5,000 Units Subcutaneous Q8H   insulin  aspart  0-9 Units Subcutaneous Q4H   multivitamin  1 tablet Oral BID   pantoprazole  (PROTONIX ) IV  40 mg Intravenous Q12H   rosuvastatin   20 mg Oral Daily   thiamine   100 mg Oral Daily   zinc  sulfate (50mg  elemental zinc )  220 mg Oral Daily     sodium chloride  125 mL/hr at 03/10/24 0557   ceFEPime  (MAXIPIME ) IV     linezolid  (ZYVOX ) IV Stopped (03/09/24 2326)   metronidazole  Stopped (03/09/24 2313)   norepinephrine  (LEVOPHED ) Adult infusion Stopped (03/09/24 1842)   vasopressin  Stopped (03/09/24 0907)   acetaminophen , antiseptic oral rinse, docusate sodium , mouth rinse, polyethylene glycol

## 2024-03-10 NOTE — Plan of Care (Signed)
" °  Problem: Fluid Volume: Goal: Ability to maintain a balanced intake and output will improve Outcome: Progressing   Problem: Health Behavior/Discharge Planning: Goal: Ability to identify and utilize available resources and services will improve Outcome: Progressing Goal: Ability to manage health-related needs will improve Outcome: Progressing   Problem: Nutritional: Goal: Maintenance of adequate nutrition will improve Outcome: Progressing   Problem: Tissue Perfusion: Goal: Adequacy of tissue perfusion will improve Outcome: Progressing   "

## 2024-03-10 NOTE — Progress Notes (Signed)
 eLink Physician-Brief Progress Note Patient Name: Joann Campos DOB: 1955/09/20 MRN: [id number]   Date of Service  03/10/2024  HPI/Events of Note  Patient had another change in the level of consciousness.  It happened earlier in the day and a repeat CT head was performed which was within normal limits.  No changes on blood pressure, sugar intact.  Not disoriented-repeating date of birth to all questions.  eICU Interventions  Given no intracranial or new metabolic abnormalities, favor delirium.  Comfortable with family at bedside, no intervention indicated at this time   74 -now she has agitated delirium with confusion.  Fighting with the nurses and almost pulled out her HD cath despite mittens in place.  One-time olanzapine  IV.  Wrist restraints for patient's own safety.    0437 - K2.7, kcl  Intervention Category Intermediate Interventions: Change in mental status - evaluation and management  Jevon Shells 03/10/2024, 8:13 PM

## 2024-03-10 NOTE — Progress Notes (Signed)
 "  NAME:  Joann Campos, MRN:  [id number], DOB:  [birth year], LOS: 2 ADMISSION DATE:  03/08/2024, CONSULTATION DATE:  12/19 REFERRING MD:  Dr. Dasie, CHIEF COMPLAINT:  aki and septic shock   History of Present Illness:  Patient is a 68 yo F w/ pertinent PMH DMT2, HLD, peripheral neuropathy, anxiety presents to [facility] ED on 12/19 w/ septic shock and aki.  For the past 2 weeks patient has had N/V, generalized mild abd discomfort, poor po intake. Denies fever, diarrhea or constipation. Was seen by urgent care on 12/9 and given fluids. Thought related to viral illness educated to push po fluids. Symptoms persists and came to [facility] ED on 12/19 for further workup. On arrival hypotensive. Afebrile and wbc 19. LA 13. Cultures obtained, given fluids, and started on broad spectrum abx. CT abd/pelvis w/ no acute findings; chronic bibasilar atelectasis, 1-2 mm nonobstructive left renal calculus. Creat 10.9, bun 87, co2 8, AG 47, k 4.2, calcium  10.4. VBG 7.01, 27, 46, 6.8. Nephro consulted. Despite fluids remained hypotensive and started on levo. Pccm consulted for icu admission.  Pertinent  Medical History   Past Medical History:  Diagnosis Date   Diabetes (HCC) 09/2021   Hip pain 2021   Hyperlipidemia      Significant Hospital Events: Including procedures, antibiotic start and stop dates in addition to other pertinent events   12/19 septic shock on levo and aki starting crrt 12/20 Patient weaning pressor however her pressor start back again in the afternoon from 2 to 10. She has polyurea and she is on fluids. 12/21 off pressors, off CRRT  Interim History / Subjective:  See above  Objective    Blood pressure 137/82, pulse 95, temperature 98.2 F (36.8 C), temperature source Axillary, resp. rate 15, height 5' 5 (1.651 m), weight 64.5 kg, SpO2 96%.        Intake/Output Summary (Last 24 hours) at 03/10/2024 1529 Last data filed at 03/10/2024 1250 Gross per 24 hour  Intake 3115.58 ml  Output 1850  ml  Net 1265.58 ml   Filed Weights   03/08/24 1700 03/09/24 0500 03/10/24 0500  Weight: 63 kg 64.6 kg 64.5 kg    Examination: General:  NAD, alert and oriented HEENT: MM pink/moist Neuro: Aox3; MAE CV: s1s2, RRR, no m/r/g PULM:  dim clear BS bilaterally GI: soft, bsx4 active  Extremities: warm/dry, no edema  Skin: no rashes or lesions    Resolved problem list   Assessment and Plan   Shock: hypovolemic and/or Sepsis > resolved -possible gastroenteritis; poor po intake past 2 weeks, and high urine output. Pocus showed hypovolemia. Plan: -of pressors -Continue NS 75 cc/h -cont broad spectrum abx for now: ceftx+ metro for 5 days > then discontinue  -MRSA neg -follow cultures, ua pending.  AKI improving (off CRRT) AGMA resolved Lactic acidosis resolved Possible metformin  toxicity induced lactic acidosis Plan: -CT a/p was neg for acute findings. Bibasilar subsegmental atelectasis. 1-2 mm nonobstructive left renal calculus. -Normal lactate. Off CRRT.  -Good urine output, likely kidney recovering. -renal us  was normal -Trend BMP / urinary output -Replace electrolytes as indicated -Avoid nephrotoxic agents, ensure adequate renal perfusion -Plan to remove dialysis line, and Transfer to gen med.  Prolonged qtc Plan: -check mag -tele monitoring -hold home psych meds -avoid qtc prolonging agents  DMT2 Plan: -ssi and cbg monitoring -a1c  HLD Plan: -statin  Peripheral neuropathy Plan: -hold gabapentin   Anxiety Plan: -hold prozac    Best Practice (right click and Reselect all SmartList Selections daily)  Diet/type: on renal diet DVT prophylaxis: prophylactic heparin   GI prophylaxis: PPI Lines: Dialysis Catheter > remove tomorrow. Foley:  Yes, and it is still needed Code Status:  full code   Labs   CBC: Recent Labs  Lab 03/08/24 1015 03/09/24 0508 03/10/24 0322  WBC 19.6* 20.9* 12.5*  NEUTROABS 16.2*  --   --   HGB 13.2 9.5* 9.3*  HCT 41.8  27.4* 26.9*  MCV 89.9 80.6 82.0  PLT 549* 421* 250    Basic Metabolic Panel: Recent Labs  Lab 03/08/24 1015 03/08/24 1816 03/09/24 0236 03/09/24 0237 03/09/24 1207 03/10/24 0322  NA 145  --   --  139 137 140  K 4.2  --   --  3.6 4.2 3.2*  CL 90*  --   --  96* 99 104  CO2 8*  --   --  23 22 24   GLUCOSE 90  --   --  144* 209* 147*  BUN 87*  --   --  51* 31* 23  CREATININE 10.90*  --   --  4.45* 2.84* 2.61*  CALCIUM  10.4*  --   --  7.5* 7.7* 7.0*  MG  --  2.1 1.9  --   --   --   PHOS  --   --  2.1* 2.1*  --   --    GFR: Estimated Creatinine Clearance: 19.1 mL/min (A) (by C-G formula based on SCr of 2.61 mg/dL (H)). Recent Labs  Lab 03/08/24 1015 03/08/24 1332 03/08/24 1816 03/08/24 2230 03/09/24 0508 03/09/24 0939 03/10/24 0322  PROCALCITON  --   --  1.42  --   --   --   --   WBC 19.6*  --   --   --  20.9*  --  12.5*  LATICACIDVEN  --  13.6* >9.0* 5.8*  --  1.1  --     Liver Function Tests: Recent Labs  Lab 03/08/24 1015 03/09/24 0237  AST 45*  --   ALT 32  --   ALKPHOS 99  --   BILITOT 0.3  --   PROT 7.4  --   ALBUMIN 4.2 3.1*   Recent Labs  Lab 03/08/24 1015  LIPASE 64*   No results for input(s): AMMONIA in the last 168 hours.  ABG    Component Value Date/Time   HCO3 26.0 03/09/2024 0937   ACIDBASEDEF 23.1 (H) 03/08/2024 1405   O2SAT 65.9 03/09/2024 0937     Coagulation Profile: No results for input(s): INR, PROTIME in the last 168 hours.  Cardiac Enzymes: No results for input(s): CKTOTAL, CKMB, CKMBINDEX, TROPONINI in the last 168 hours.  HbA1C: Hemoglobin A1C  Date/Time Value Ref Range Status  07/25/2023 08:41 AM 6.8 (A) 4.0 - 5.6 % Final  08/05/2022 09:39 AM 7.6 (A) 4.0 - 5.6 % Final   Hgb A1c MFr Bld  Date/Time Value Ref Range Status  03/08/2024 06:16 PM 6.8 (H) 4.8 - 5.6 % Final    Comment:    (NOTE) Diagnosis of Diabetes The following HbA1c ranges recommended by the American Diabetes Association (ADA) may be  used as an aid in the diagnosis of diabetes mellitus.  Hemoglobin             Suggested A1C NGSP%              Diagnosis  <5.7                   Non Diabetic  5.7-6.4  Pre-Diabetic  >6.4                   Diabetic  <7.0                   Glycemic control for                       adults with diabetes.    09/29/2021 12:01 PM 12.9 (H) 4.8 - 5.6 % Final    Comment:             Prediabetes: 5.7 - 6.4          Diabetes: >6.4          Glycemic control for adults with diabetes: <7.0     CBG: Recent Labs  Lab 03/09/24 2010 03/10/24 0008 03/10/24 0342 03/10/24 0800 03/10/24 1235  GLUCAP 175* 164* 136* 146* 156*    Review of Systems:   As above   Past Medical History:  She,  has a past medical history of Diabetes (HCC) (09/2021), Hip pain (2021), and Hyperlipidemia.   Surgical History:   Past Surgical History:  Procedure Laterality Date   BREAST REDUCTION SURGERY Bilateral    1990s   CHOLECYSTECTOMY     COLONOSCOPY WITH PROPOFOL  N/A 03/01/2022   Procedure: COLONOSCOPY WITH PROPOFOL ;  Surgeon: San Sandor GAILS, DO;  Location: [facility];  Service: Gastroenterology;  Laterality: N/A;   ENDOSCOPIC MUCOSAL RESECTION N/A 03/01/2022   Procedure: ENDOSCOPIC MUCOSAL RESECTION;  Surgeon: San Sandor GAILS, DO;  Location: [facility];  Service: Gastroenterology;  Laterality: N/A;   FLEXIBLE SIGMOIDOSCOPY N/A 10/13/2022   Procedure: FLEXIBLE SIGMOIDOSCOPY;  Surgeon: San Sandor GAILS, DO;  Location: [facility];  Service: Gastroenterology;  Laterality: N/A;   SCLEROTHERAPY  03/01/2022   Procedure: SCLEROTHERAPY;  Surgeon: San Sandor GAILS, DO;  Location: [facility];  Service: Gastroenterology;;     Social History:   reports that she has quit smoking. Her smoking use included cigarettes. She has never been exposed to tobacco smoke. She has never used smokeless tobacco. She reports that she does not drink alcohol and does not use drugs.   Family  History:  Her family history includes CVA in her brother and sibling; Colon cancer (age of onset: 108) in her mother; Colon polyps (age of onset: 63) in her mother; Diabetes in her brother; Hypercholesterolemia in her father. There is no history of Crohn's disease, Esophageal cancer, Rectal cancer, Stomach cancer, Ulcerative colitis, Breast cancer, or BRCA 1/2.   Allergies Allergies[1]   Home Medications  Prior to Admission medications  Medication Sig Start Date End Date Taking? Authorizing Provider  CONTOUR NEXT TEST test strip USE AS DIRECTED 10/09/23   Tobie Gaines, DO  Cyanocobalamin (VITAMIN B 12 PO) Take 1 tablet by mouth daily. One tablet    [provider]  FLUoxetine  (PROZAC ) 10 MG capsule TAKE 1 CAPSULE BY MOUTH DAILY 10/10/23   Tobie Gaines, DO  gabapentin  (NEURONTIN ) 300 MG capsule TAKE 2 CAPSULES BY MOUTH AT  BEDTIME 09/27/23   Amoako, Prince, MD  insulin  glargine (LANTUS  SOLOSTAR) 100 UNIT/ML Solostar Pen Inject 10 Units into the skin at bedtime. 06/23/23   Tobie Gaines, DO  Insulin  Pen Needle (BD PEN NEEDLE NANO 2ND GEN) 32G X 4 MM MISC USE 1 EACH DAILY 10/10/23   Tobie Gaines, DO  metFORMIN  (GLUCOPHAGE -XR) 500 MG 24 hr tablet TAKE 2 TABLETS BY MOUTH TWICE  DAILY WITH A MEAL 07/13/23   Tobie Gaines, DO  Microlet Lancets MISC TEST ONCE DAILY 07/21/23   Tobie Gaines, DO  Multiple Vitamins-Minerals (MULTIVITAMIN ADULTS PO) Take 1 tablet by mouth daily. Women over 50    [provider]  ondansetron  (ZOFRAN -ODT) 4 MG disintegrating tablet Take 1 tablet (4 mg total) by mouth every 8 (eight) hours as needed. 02/27/24   Leatrice Vernell HERO, NP  oxyCODONE -acetaminophen  (PERCOCET/ROXICET) 5-325 MG tablet Take 1 tablet by mouth every 6 (six) hours as needed for severe pain (pain score 7-10). Patient not taking: Reported on 10/18/2023 10/05/23   Roseann Adine PARAS., MD  rosuvastatin  (CRESTOR ) 20 MG tablet TAKE 1 TABLET BY MOUTH DAILY 10/10/23   Tobie Gaines, DO  tamsulosin  (FLOMAX ) 0.4 MG CAPS  capsule Take 1 capsule (0.4 mg total) by mouth daily. 09/29/23   Smoot, Lauraine LABOR, PA-C     Critical care time: 30 minutes    Marny patch, MD [facility] 03/10/2024, 3:29 PM  Please see [email or web address] for pager details.  From 7A-7P if no response, please call [phone] After hours, please call ELink [phone].          [1]  Allergies Allergen Reactions   Metformin  And Related Other (See Comments)    Lactic acidosis   "

## 2024-03-10 NOTE — Progress Notes (Signed)
 Patient alert and oriented x3, disoriented to year on AM assessment. Adrien MD notified, delirium precautions in place, lights on. Speech clear and motor strength intact, no deficits. Reassessed around 1045 could not answer year, but states that christmas was coming up.   1645: RN entered room to reassess patient and administer insulin . When talking with patient RN noted increased confusion and recall trouble when asked about pain. CBG 146. Clear speech, intact motor function, pupils 3mm and reactive, no facial droop and face symmetrical on assessment. Adrien MD at bedside- orders placed for non-contrast head CT.   Transported to CT and back without issue.

## 2024-03-11 LAB — GLUCOSE, CAPILLARY
Glucose-Capillary: 125 mg/dL — ABNORMAL HIGH (ref 70–99)
Glucose-Capillary: 134 mg/dL — ABNORMAL HIGH (ref 70–99)
Glucose-Capillary: 155 mg/dL — ABNORMAL HIGH (ref 70–99)
Glucose-Capillary: 161 mg/dL — ABNORMAL HIGH (ref 70–99)
Glucose-Capillary: 165 mg/dL — ABNORMAL HIGH (ref 70–99)
Glucose-Capillary: 172 mg/dL — ABNORMAL HIGH (ref 70–99)
Glucose-Capillary: 188 mg/dL — ABNORMAL HIGH (ref 70–99)

## 2024-03-11 LAB — RENAL FUNCTION PANEL
Albumin: 3 g/dL — ABNORMAL LOW (ref 3.5–5.0)
Anion gap: 16 — ABNORMAL HIGH (ref 5–15)
BUN: 22 mg/dL (ref 8–23)
CO2: 23 mmol/L (ref 22–32)
Calcium: 7.3 mg/dL — ABNORMAL LOW (ref 8.9–10.3)
Chloride: 104 mmol/L (ref 98–111)
Creatinine, Ser: 2.83 mg/dL — ABNORMAL HIGH (ref 0.44–1.00)
GFR, Estimated: 18 mL/min — ABNORMAL LOW
Glucose, Bld: 176 mg/dL — ABNORMAL HIGH (ref 70–99)
Phosphorus: 1.9 mg/dL — ABNORMAL LOW (ref 2.5–4.6)
Potassium: 2.7 mmol/L — CL (ref 3.5–5.1)
Sodium: 143 mmol/L (ref 135–145)

## 2024-03-11 LAB — GASTROINTESTINAL PANEL BY PCR, STOOL (REPLACES STOOL CULTURE)

## 2024-03-11 LAB — BASIC METABOLIC PANEL WITH GFR
Anion gap: 14 (ref 5–15)
BUN: 20 mg/dL (ref 8–23)
CO2: 26 mmol/L (ref 22–32)
Calcium: 7.4 mg/dL — ABNORMAL LOW (ref 8.9–10.3)
Chloride: 107 mmol/L (ref 98–111)
Creatinine, Ser: 2.53 mg/dL — ABNORMAL HIGH (ref 0.44–1.00)
GFR, Estimated: 20 mL/min — ABNORMAL LOW
Glucose, Bld: 126 mg/dL — ABNORMAL HIGH (ref 70–99)
Potassium: 3.6 mmol/L (ref 3.5–5.1)
Sodium: 146 mmol/L — ABNORMAL HIGH (ref 135–145)

## 2024-03-11 LAB — URINE CULTURE: Culture: 60000 — AB

## 2024-03-11 LAB — MAGNESIUM: Magnesium: 2.2 mg/dL (ref 1.7–2.4)

## 2024-03-11 LAB — CALCIUM, IONIZED: Calcium, Ionized, Serum: 3.8 mg/dL — ABNORMAL LOW (ref 4.5–5.6)

## 2024-03-11 MED ORDER — POTASSIUM CHLORIDE 10 MEQ/50ML IV SOLN
10.0000 meq | INTRAVENOUS | Status: AC
  Administered 2024-03-11 (×6): 10 meq via INTRAVENOUS
  Filled 2024-03-11 (×6): qty 50

## 2024-03-11 MED ORDER — POTASSIUM PHOSPHATES 15 MMOLE/5ML IV SOLN
30.0000 mmol | Freq: Once | INTRAVENOUS | Status: AC
  Administered 2024-03-11: 30 mmol via INTRAVENOUS
  Filled 2024-03-11: qty 10

## 2024-03-11 MED ORDER — GERHARDT'S BUTT CREAM
TOPICAL_CREAM | Freq: Two times a day (BID) | CUTANEOUS | Status: DC
  Filled 2024-03-11: qty 60

## 2024-03-11 MED ORDER — OLANZAPINE 10 MG IM SOLR
5.0000 mg | Freq: Once | INTRAMUSCULAR | Status: AC
  Administered 2024-03-11: 5 mg via INTRAVENOUS
  Filled 2024-03-11: qty 10

## 2024-03-11 NOTE — Progress Notes (Signed)
 [facility] NEPHROLOGY PROGRESS NOTE  Assessment/ Plan: Pt is a 68 y.o. yo female with past medical history of DM, hypertension presented with nausea vomiting and abdominal discomfort developed septic shock, AKI requiring CRRT.  # Acute kidney injury likely ischemic ATN due to septic shock.  Required CRRT which was discontinued on 12/20.  She has increased urine output and creatinine level seems to be stable.  Continue supportive care.  Strict ins and out and daily lab.  Expect gradual renal recovery.  Ok to discontinue HD catheter.  # Severe metabolic acidosis due to renal failure, shock, severe lactic acidosis.  Improved with IV fluid, CRRT.  # Septic shock, hypovolemic.  Possibly due to gastroenteritis.  Currently off of pressors.  Receiving Flagyl  and ceftriaxone .  # Hypokalemia: Due to GI losses.  Magnesium  level acceptable.  Receiving IV potassium repletion.  Monitor lab.  Encourage oral intake.  Sign off, please call us  back with question.  Discussed with PCCM via secure chat.  Subjective: Seen and examined in ICU.  Urine output is around 3 L.  Off of pressors.  No new event overnight. Objective Vital signs in last 24 hours: Vitals:   03/10/24 1918 03/10/24 2350 03/11/24 0800 03/11/24 0900  BP:   (!) 160/78 (!) 176/80  Pulse:    93  Resp:   20 14  Temp: 98.3 F (36.8 C) 98.6 F (37 C) 97.7 F (36.5 C)   TempSrc: Oral Oral Axillary   SpO2:   98% 98%  Weight:      Height:       Weight change:   Intake/Output Summary (Last 24 hours) at 03/11/2024 0958 Last data filed at 03/11/2024 0955 Gross per 24 hour  Intake 2589.59 ml  Output 3000 ml  Net -410.41 ml       Labs: RENAL PANEL Recent Labs  Lab 03/08/24 1015 03/08/24 1816 03/09/24 0236 03/09/24 0237 03/09/24 1207 03/10/24 0322 03/10/24 1653 03/11/24 0231 03/11/24 0600  NA 145  --   --  139 137 140 142 143  --   K 4.2  --   --  3.6 4.2 3.2* 2.6* 2.7*  --   CL 90*  --   --  96* 99 104 103  104  --   CO2 8*  --   --  23 22 24  21* 23  --   GLUCOSE 90  --   --  144* 209* 147* 140* 176*  --   BUN 87*  --   --  51* 31* 23 24* 22  --   CREATININE 10.90*  --   --  4.45* 2.84* 2.61* 2.71* 2.83*  --   CALCIUM  10.4*  --   --  7.5* 7.7* 7.0* 6.9* 7.3*  --   MG  --  2.1 1.9  --   --   --   --   --  2.2  PHOS  --   --  2.1* 2.1*  --   --   --  1.9*  --   ALBUMIN 4.2  --   --  3.1*  --   --   --  3.0*  --     Liver Function Tests: Recent Labs  Lab 03/08/24 1015 03/09/24 0237 03/11/24 0231  AST 45*  --   --   ALT 32  --   --   ALKPHOS 99  --   --   BILITOT 0.3  --   --   PROT 7.4  --   --  ALBUMIN 4.2 3.1* 3.0*   Recent Labs  Lab 03/08/24 1015  LIPASE 64*   No results for input(s): AMMONIA in the last 168 hours. CBC: Recent Labs    09/29/23 1229 10/04/23 0223 03/08/24 1015 03/09/24 0508 03/10/24 0322  HGB 13.3 10.8* 13.2 9.5* 9.3*  MCV 88.4 88.5 89.9 80.6 82.0    Cardiac Enzymes: No results for input(s): CKTOTAL, CKMB, CKMBINDEX, TROPONINI in the last 168 hours. CBG: Recent Labs  Lab 03/10/24 1621 03/10/24 2002 03/11/24 0009 03/11/24 0626 03/11/24 0811  GLUCAP 146* 173* 165* 155* 161*    Iron Studies: No results for input(s): IRON, TIBC, TRANSFERRIN, FERRITIN in the last 72 hours. Studies/Results: CT HEAD WO CONTRAST ( ) Result Date: 03/10/2024 CLINICAL DATA:  Initial evaluation for acute altered mental status. EXAM: CT HEAD WITHOUT CONTRAST TECHNIQUE: Contiguous axial images were obtained from the base of the skull through the vertex without intravenous contrast. RADIATION DOSE REDUCTION: This exam was performed according to the departmental dose-optimization program which includes automated exposure control, adjustment of the mA and/or kV according to patient size and/or use of iterative reconstruction technique. COMPARISON:  None Available. FINDINGS: Brain: Cerebral volume within normal limits for patient age. No acute intracranial  hemorrhage. No acute large vessel territory infarct. No mass lesion, midline shift, or mass effect. Ventricles are normal in size without hydrocephalus. No extra-axial fluid collection. Vascular: No abnormal hyperdense vessel. Skull: Scalp soft tissues demonstrate no acute abnormality. Calvarium intact. Sinuses/Orbits: Globes and orbital soft tissues within normal limits. Trace layering secretions present within the left sphenoid sinus. Paranasal sinuses are otherwise clear. No mastoid effusion. IMPRESSION: Normal head CT. No acute intracranial abnormality. Electronically Signed   By: Morene Hoard M.D.   On: 03/10/2024 19:44    Medications: Infusions:  sodium chloride  75 mL/hr at 03/11/24 0955   cefTRIAXone  (ROCEPHIN )  IV Stopped (03/10/24 1121)   metronidazole  Stopped (03/11/24 0930)   potassium chloride  50 mL/hr at 03/11/24 0955    Scheduled Medications:  (feeding supplement) PROSource Plus  30 mL Oral BID   Chlorhexidine  Gluconate Cloth  6 each Topical Daily   famotidine   20 mg Oral Daily   feeding supplement  237 mL Oral TID BM   heparin   5,000 Units Subcutaneous Q8H   insulin  aspart  0-9 Units Subcutaneous Q4H   multivitamin  1 tablet Oral BID   rosuvastatin   20 mg Oral Daily   thiamine   100 mg Oral Daily   zinc  sulfate (50mg  elemental zinc )  220 mg Oral Daily    have reviewed scheduled and prn medications.  Physical Exam: General:NAD, comfortable Heart:RRR, s1s2 nl Lungs:clear b/l, no crackle Abdomen:soft, Non-tender, non-distended Extremities:No edema Neurology: Alert, awake.  Tekesha Almgren Prasad Frankye Schwegel 03/11/2024,9:58 AM  LOS: 3 days

## 2024-03-11 NOTE — Progress Notes (Addendum)
 eLink Physician-Brief Progress Note Patient Name: Joann Campos DOB: May 08, 1955 MRN: [id number]   Date of Service  03/11/2024  HPI/Events of Note  Agitated delirium again tonight  eICU Interventions  Repeat olanzapine    2212 - redness and irritation to sacrum with no relief from barrier cream; Gerhardts cream  Intervention Category Minor Interventions: Agitation / anxiety - evaluation and management  Cree Napoli 03/11/2024, 9:28 PM

## 2024-03-11 NOTE — Progress Notes (Signed)
" °   03/11/24 1632  TOC Brief Assessment  Insurance and Status Reviewed  Patient has primary care physician Yes  Home environment has been reviewed Single family home  Prior level of function: Independent with ADL's  Prior/Current Home Services No current home services  Social Drivers of Health Review SDOH reviewed no interventions necessary  Readmission risk has been reviewed Yes  Transition of care needs transition of care needs identified, TOC will continue to follow    "

## 2024-03-11 NOTE — Progress Notes (Addendum)
 "  NAME:  Joann Campos, MRN:  [id number], DOB:  [birth year], LOS: 3 ADMISSION DATE:  03/08/2024, CONSULTATION DATE:  12/19 REFERRING MD:  Dr. Dasie, CHIEF COMPLAINT:  aki and septic shock   History of Present Illness:  Patient is a 68 yo F w/ pertinent PMH DMT2, HLD, peripheral neuropathy, anxiety presents to [facility] ED on 12/19 w/ septic shock and aki.  For the past 2 weeks patient has had N/V, generalized mild abd discomfort, poor po intake. Denies fever, diarrhea or constipation. Was seen by urgent care on 12/9 and given fluids. Thought related to viral illness educated to push po fluids. Symptoms persists and came to [facility] ED on 12/19 for further workup. On arrival hypotensive. Afebrile and wbc 19. LA 13. Cultures obtained, given fluids, and started on broad spectrum abx. CT abd/pelvis w/ no acute findings; chronic bibasilar atelectasis, 1-2 mm nonobstructive left renal calculus. Creat 10.9, bun 87, co2 8, AG 47, k 4.2, calcium  10.4. VBG 7.01, 27, 46, 6.8. Nephro consulted. Despite fluids remained hypotensive and started on levo. Pccm consulted for icu admission.  Pertinent  Medical History   Past Medical History:  Diagnosis Date   Diabetes (HCC) 09/2021   Hip pain 2021   Hyperlipidemia      Significant Hospital Events: Including procedures, antibiotic start and stop dates in addition to other pertinent events   12/19 septic shock on levo and aki starting crrt 12/20 Patient weaning pressor however her pressor start back again in the afternoon from 2 to 10. She has polyurea and she is on fluids. 12/21 off pressors, off CRRT  Interim History / Subjective:  See above  Objective    Blood pressure (!) 162/97, pulse 98, temperature 98 F (36.7 C), temperature source Oral, resp. rate 18, height 5' 5 (1.651 m), weight 64.5 kg, SpO2 99%.        Intake/Output Summary (Last 24 hours) at 03/11/2024 1413 Last data filed at 03/11/2024 1244 Gross per 24 hour  Intake 2580.39 ml  Output 3950  ml  Net -1369.61 ml   Filed Weights   03/08/24 1700 03/09/24 0500 03/10/24 0500  Weight: 63 kg 64.6 kg 64.5 kg    Examination: General:  NAD, alert and oriented HEENT: MM pink/moist Neuro: Aox3; MAE CV: s1s2, RRR, no m/r/g PULM:  dim clear BS bilaterally GI: soft, bsx4 active  Extremities: warm/dry, no edema  Skin: no rashes or lesions    Resolved problem list   Assessment and Plan   Shock: hypovolemic and/or Sepsis > resolved -possible gastroenteritis; poor po intake past 2 weeks, and high urine output. Pocus showed hypovolemia. Plan: -LR as need it  -cont broad spectrum abx for now: ceftx+ metro for 5 days > then discontinue  -MRSA neg -follow cultures, ua pending. -Transfer to gen med  AKI improving (off CRRT) AGMA resolved Lactic acidosis resolved Possible metformin  toxicity induced lactic acidosis HypoKalemia Plan: -CT a/p was neg for acute findings. Bibasilar subsegmental atelectasis. 1-2 mm nonobstructive left renal calculus. -Normal lactate. Off CRRT.  -Good urine output, likely kidney recovering. -replete fluid as need it -renal us  was normal -Trend BMP / urinary output -Replace electrolytes as indicated - K IV x6 today. Patient refusing PO meds. -Avoid nephrotoxic agents, ensure adequate renal perfusion -Plan to remove dialysis line today, and Transfer to gen med.  Prolonged qtc Plan: -check mag -tele monitoring -hold home psych meds -avoid qtc prolonging agents  DMT2 Plan: -ssi and cbg monitoring -a1c  HLD Plan: -statin  Peripheral neuropathy  Plan: -hold gabapentin   Anxiety Head CT neg Plan: -hold prozac    Best Practice (right click and Reselect all SmartList Selections daily)   Diet/type: on renal diet DVT prophylaxis: prophylactic heparin   GI prophylaxis: PPI Lines: remove today Foley:  Yes, and it is still needed Code Status:  full code   Labs   CBC: Recent Labs  Lab 03/08/24 1015 03/09/24 0508 03/10/24 0322   WBC 19.6* 20.9* 12.5*  NEUTROABS 16.2*  --   --   HGB 13.2 9.5* 9.3*  HCT 41.8 27.4* 26.9*  MCV 89.9 80.6 82.0  PLT 549* 421* 250    Basic Metabolic Panel: Recent Labs  Lab 03/08/24 1816 03/09/24 0236 03/09/24 0237 03/09/24 1207 03/10/24 0322 03/10/24 1653 03/11/24 0231 03/11/24 0600  NA  --   --  139 137 140 142 143  --   K  --   --  3.6 4.2 3.2* 2.6* 2.7*  --   CL  --   --  96* 99 104 103 104  --   CO2  --   --  23 22 24  21* 23  --   GLUCOSE  --   --  144* 209* 147* 140* 176*  --   BUN  --   --  51* 31* 23 24* 22  --   CREATININE  --   --  4.45* 2.84* 2.61* 2.71* 2.83*  --   CALCIUM   --   --  7.5* 7.7* 7.0* 6.9* 7.3*  --   MG 2.1 1.9  --   --   --   --   --  2.2  PHOS  --  2.1* 2.1*  --   --   --  1.9*  --    GFR: Estimated Creatinine Clearance: 17.6 mL/min (A) (by C-G formula based on SCr of 2.83 mg/dL (H)). Recent Labs  Lab 03/08/24 1015 03/08/24 1332 03/08/24 1816 03/08/24 2230 03/09/24 0508 03/09/24 0939 03/10/24 0322  PROCALCITON  --   --  1.42  --   --   --   --   WBC 19.6*  --   --   --  20.9*  --  12.5*  LATICACIDVEN  --  13.6* >9.0* 5.8*  --  1.1  --     Liver Function Tests: Recent Labs  Lab 03/08/24 1015 03/09/24 0237 03/11/24 0231  AST 45*  --   --   ALT 32  --   --   ALKPHOS 99  --   --   BILITOT 0.3  --   --   PROT 7.4  --   --   ALBUMIN 4.2 3.1* 3.0*   Recent Labs  Lab 03/08/24 1015  LIPASE 64*   No results for input(s): AMMONIA in the last 168 hours.  ABG    Component Value Date/Time   HCO3 26.0 03/09/2024 0937   ACIDBASEDEF 23.1 (H) 03/08/2024 1405   O2SAT 65.9 03/09/2024 0937     Coagulation Profile: No results for input(s): INR, PROTIME in the last 168 hours.  Cardiac Enzymes: No results for input(s): CKTOTAL, CKMB, CKMBINDEX, TROPONINI in the last 168 hours.  HbA1C: Hemoglobin A1C  Date/Time Value Ref Range Status  07/25/2023 08:41 AM 6.8 (A) 4.0 - 5.6 % Final  08/05/2022 09:39 AM 7.6 (A) 4.0 -  5.6 % Final   Hgb A1c MFr Bld  Date/Time Value Ref Range Status  03/08/2024 06:16 PM 6.8 (H) 4.8 - 5.6 % Final    Comment:    (  NOTE) Diagnosis of Diabetes The following HbA1c ranges recommended by the American Diabetes Association (ADA) may be used as an aid in the diagnosis of diabetes mellitus.  Hemoglobin             Suggested A1C NGSP%              Diagnosis  <5.7                   Non Diabetic  5.7-6.4                Pre-Diabetic  >6.4                   Diabetic  <7.0                   Glycemic control for                       adults with diabetes.    09/29/2021 12:01 PM 12.9 (H) 4.8 - 5.6 % Final    Comment:             Prediabetes: 5.7 - 6.4          Diabetes: >6.4          Glycemic control for adults with diabetes: <7.0     CBG: Recent Labs  Lab 03/10/24 2002 03/11/24 0009 03/11/24 0626 03/11/24 0811 03/11/24 1205  GLUCAP 173* 165* 155* 161* 125*    Review of Systems:   As above   Past Medical History:  She,  has a past medical history of Diabetes (HCC) (09/2021), Hip pain (2021), and Hyperlipidemia.   Surgical History:   Past Surgical History:  Procedure Laterality Date   BREAST REDUCTION SURGERY Bilateral    1990s   CHOLECYSTECTOMY     COLONOSCOPY WITH PROPOFOL  N/A 03/01/2022   Procedure: COLONOSCOPY WITH PROPOFOL ;  Surgeon: San Sandor GAILS, DO;  Location: [facility];  Service: Gastroenterology;  Laterality: N/A;   ENDOSCOPIC MUCOSAL RESECTION N/A 03/01/2022   Procedure: ENDOSCOPIC MUCOSAL RESECTION;  Surgeon: San Sandor GAILS, DO;  Location: [facility];  Service: Gastroenterology;  Laterality: N/A;   FLEXIBLE SIGMOIDOSCOPY N/A 10/13/2022   Procedure: FLEXIBLE SIGMOIDOSCOPY;  Surgeon: San Sandor GAILS, DO;  Location: [facility];  Service: Gastroenterology;  Laterality: N/A;   SCLEROTHERAPY  03/01/2022   Procedure: SCLEROTHERAPY;  Surgeon: San Sandor GAILS, DO;  Location: [facility];  Service: Gastroenterology;;     Social  History:   reports that she has quit smoking. Her smoking use included cigarettes. She has never been exposed to tobacco smoke. She has never used smokeless tobacco. She reports that she does not drink alcohol and does not use drugs.   Family History:  Her family history includes CVA in her brother and sibling; Colon cancer (age of onset: 77) in her mother; Colon polyps (age of onset: 60) in her mother; Diabetes in her brother; Hypercholesterolemia in her father. There is no history of Crohn's disease, Esophageal cancer, Rectal cancer, Stomach cancer, Ulcerative colitis, Breast cancer, or BRCA 1/2.   Allergies Allergies[1]   Home Medications  Prior to Admission medications  Medication Sig Start Date End Date Taking? Authorizing Provider  CONTOUR NEXT TEST test strip USE AS DIRECTED 10/09/23   Tobie Gaines, DO  Cyanocobalamin (VITAMIN B 12 PO) Take 1 tablet by mouth daily. One tablet    [provider]  FLUoxetine  (PROZAC ) 10 MG capsule TAKE 1 CAPSULE BY MOUTH DAILY 10/10/23  Tobie Gaines, DO  gabapentin  (NEURONTIN ) 300 MG capsule TAKE 2 CAPSULES BY MOUTH AT  BEDTIME 09/27/23   Renne Homans, MD  insulin  glargine (LANTUS  SOLOSTAR) 100 UNIT/ML Solostar Pen Inject 10 Units into the skin at bedtime. 06/23/23   Tobie Gaines, DO  Insulin  Pen Needle (BD PEN NEEDLE NANO 2ND GEN) 32G X 4 MM MISC USE 1 EACH DAILY 10/10/23   Tobie Gaines, DO  metFORMIN  (GLUCOPHAGE -XR) 500 MG 24 hr tablet TAKE 2 TABLETS BY MOUTH TWICE  DAILY WITH A MEAL 07/13/23   Tobie Gaines, DO  Microlet Lancets MISC TEST ONCE DAILY 07/21/23   Tobie Gaines, DO  Multiple Vitamins-Minerals (MULTIVITAMIN ADULTS PO) Take 1 tablet by mouth daily. Women over 50    [provider]  ondansetron  (ZOFRAN -ODT) 4 MG disintegrating tablet Take 1 tablet (4 mg total) by mouth every 8 (eight) hours as needed. 02/27/24   Leatrice Vernell HERO, NP  oxyCODONE -acetaminophen  (PERCOCET/ROXICET) 5-325 MG tablet Take 1 tablet by mouth every 6 (six) hours as  needed for severe pain (pain score 7-10). Patient not taking: Reported on 10/18/2023 10/05/23   Roseann Adine PARAS., MD  rosuvastatin  (CRESTOR ) 20 MG tablet TAKE 1 TABLET BY MOUTH DAILY 10/10/23   Tobie Gaines, DO  tamsulosin  (FLOMAX ) 0.4 MG CAPS capsule Take 1 capsule (0.4 mg total) by mouth daily. 09/29/23   Smoot, Lauraine LABOR, PA-C     Critical care time: 35 minutes    Marny patch, MD [facility] 03/11/2024, 2:13 PM  Please see [email or web address] for pager details.  From 7A-7P if no response, please call [phone] After hours, please call ELink [phone].          [1]  Allergies Allergen Reactions   Metformin  And Related Other (See Comments)    Lactic acidosis   "

## 2024-03-12 DIAGNOSIS — R6521 Severe sepsis with septic shock: Secondary | ICD-10-CM | POA: Diagnosis not present

## 2024-03-12 DIAGNOSIS — N390 Urinary tract infection, site not specified: Secondary | ICD-10-CM | POA: Diagnosis not present

## 2024-03-12 DIAGNOSIS — N179 Acute kidney failure, unspecified: Secondary | ICD-10-CM | POA: Diagnosis not present

## 2024-03-12 DIAGNOSIS — A419 Sepsis, unspecified organism: Secondary | ICD-10-CM

## 2024-03-12 DIAGNOSIS — E440 Moderate protein-calorie malnutrition: Secondary | ICD-10-CM | POA: Insufficient documentation

## 2024-03-12 LAB — RENAL FUNCTION PANEL
Albumin: 3 g/dL — ABNORMAL LOW (ref 3.5–5.0)
Anion gap: 12 (ref 5–15)
BUN: 16 mg/dL (ref 8–23)
CO2: 28 mmol/L (ref 22–32)
Calcium: 7.2 mg/dL — ABNORMAL LOW (ref 8.9–10.3)
Chloride: 104 mmol/L (ref 98–111)
Creatinine, Ser: 2.28 mg/dL — ABNORMAL HIGH (ref 0.44–1.00)
GFR, Estimated: 23 mL/min — ABNORMAL LOW
Glucose, Bld: 117 mg/dL — ABNORMAL HIGH (ref 70–99)
Phosphorus: 3.2 mg/dL (ref 2.5–4.6)
Potassium: 2.5 mmol/L — CL (ref 3.5–5.1)
Sodium: 144 mmol/L (ref 135–145)

## 2024-03-12 LAB — GLUCOSE, CAPILLARY
Glucose-Capillary: 113 mg/dL — ABNORMAL HIGH (ref 70–99)
Glucose-Capillary: 185 mg/dL — ABNORMAL HIGH (ref 70–99)
Glucose-Capillary: 318 mg/dL — ABNORMAL HIGH (ref 70–99)
Glucose-Capillary: 283 mg/dL — ABNORMAL HIGH (ref 70–99)
Glucose-Capillary: 144 mg/dL — ABNORMAL HIGH (ref 70–99)

## 2024-03-12 MED ORDER — RENA-VITE PO TABS
1.0000 | ORAL_TABLET | Freq: Every day | ORAL | Status: DC
  Administered 2024-03-13: 1 via ORAL
  Filled 2024-03-12: qty 1

## 2024-03-12 MED ORDER — LOPERAMIDE HCL 2 MG PO CAPS
2.0000 mg | ORAL_CAPSULE | ORAL | Status: DC | PRN
  Administered 2024-03-12 (×3): 2 mg via ORAL
  Filled 2024-03-12 (×3): qty 1

## 2024-03-12 MED ORDER — GABAPENTIN 100 MG PO CAPS
200.0000 mg | ORAL_CAPSULE | Freq: Every day | ORAL | Status: DC
  Administered 2024-03-12 – 2024-03-13 (×2): 200 mg via ORAL
  Filled 2024-03-12 (×2): qty 2

## 2024-03-12 MED ORDER — GABAPENTIN 100 MG PO CAPS: 100.0000 mg | ORAL_CAPSULE | Freq: Every day | ORAL | Status: DC

## 2024-03-12 MED ORDER — FLUOXETINE HCL 10 MG PO CAPS
10.0000 mg | ORAL_CAPSULE | Freq: Every day | ORAL | Status: DC
  Administered 2024-03-12 – 2024-03-14 (×3): 10 mg via ORAL
  Filled 2024-03-12 (×3): qty 1

## 2024-03-12 MED ORDER — POTASSIUM CHLORIDE 20 MEQ PO PACK
60.0000 meq | PACK | ORAL | Status: AC
  Administered 2024-03-12 (×2): 60 meq via ORAL
  Filled 2024-03-12 (×2): qty 3

## 2024-03-12 NOTE — Hospital Course (Addendum)
 68 yo F w/ pertinent PMH DMT2, HLD, peripheral neuropathy, anxiety presents to [facility] ED on 12/19 w/ septic shock and aki.   For the past 2 weeks patient has had N/V, generalized mild abd discomfort, poor po intake. Denies fever, diarrhea or constipation. Was seen by urgent care on 12/9 and given fluids. Thought related to viral illness educated to push po fluids. Symptoms persists and came to [facility] ED on 12/19 for further workup. On arrival hypotensive. Afebrile and wbc 19. LA 13. Cultures obtained, given fluids, and started on broad spectrum abx. CT abd/pelvis w/ no acute findings; chronic bibasilar atelectasis, 1-2 mm nonobstructive left renal calculus. Creat 10.9, bun 87, co2 8, AG 47, k 4.2, calcium  10.4. VBG 7.01, 27, 46, 6.8. Nephro consulted. Despite fluids remained hypotensive and started on levo. Pccm consulted for icu admission. BP improved and off vasopressors.  HD catheter removed 12/22.  Transferred to [facility] service on 12/23.  She subsequently continued to improve and was deemed medically stable for discharge only to follow-up with PCP and nephrology in outpatient setting.  Assessment and Plan:  Septic and Hypovolemic Shock: MRSA neg -UA 21-50 WBC -on empiric ceftriaxone  and metronidazole , plan 5 days with last day abx 12/24 -sepsis physiology resolved -CT a/p was neg for acute findings. Bibasilar subsegmental atelectasis. 1-2 mm nonobstructive left renal calculus  -PT/OT to evaluate and Treat and recommending no follow-up   E Coli UTI: UA 21-50 WBC -Urine Culture = E coli -WBC went from 12.5 -> 6.6 and is now 6.7 at the time of discharge -finish 5 days abx of IV Ceftriaxone  1 gram q24h   AKI: Baseline 0.6-0.9. -BUN/Cr Trend: Recent Labs  Lab 03/10/24 1653 03/11/24 0231 03/11/24 1519 03/12/24 0325 03/13/24 0349 03/13/24 0353 03/14/24 0332  BUN 24* 22 20 16 16 16 21   CREATININE 2.71* 2.83* 2.53* 2.28* 1.93* 1.93* 1.76*  -Presented Serum Cr of 10.90. Nephrology consulted and was  on CRRT and now this was D/C'd 12/20. HD Catheter Removed 12/22 -Avoid Nephrotoxic Medications, Contrast Dyes, Hypotension and Dehydration to Ensure Adequate Renal Perfusion and will need to Renally Adjust Meds -Continue to Monitor and Trend Renal Function carefully and repeat CMP within 1 week and follow-up with nephrology outpatient setting   DM2 with Hyperglycemia:  HbA1c was 6.8. Continue Sensitive Novolog  SSI AC 0-9 q4h while hospitalized. CTM CBGs per Protocol. CBG Trend:  Recent Labs  Lab 03/13/24 0827 03/13/24 1109 03/13/24 1524 03/13/24 1945 03/13/24 2311 03/14/24 0331 03/14/24 0803  GLUCAP 100* 255* 163* 169* 247* 164* 131*    Hypernatremia: Na+ is improved and now 143. CTM and Trend and repeat CMP within 1 week  HLD: Continue Rosuvastatin  20 mg po Daily    Hypokalemia: K+ is now 3.5. CTM and replete w/ po KCL 40 mEQ x1 prior to discharge.  Continue monitor and replete as necessary repeat CMP within 1 week  Hypomagesemia: Mag Level is 1.8. Replete w/ IV Mag Sulfate 2 grams. CTM and Replete as Necessary   AGMA: Resolved. Possible Metformin  Toxicity induced Lactic Acidosis. Resolved as CO2 is 26, Chloride Level is 109, and AG is 8. CTM and trend and repeat CMP in the AM    Peripheral Neuropathy: Resumed Gabapentin  200 m po qHS   Anxiety: Was holding Fluoxetine  10 mg po daily but resumed 12/23   Diarrhea: GIP neg and has no abd pain. C/w prn imodium   Normocytic Anemia: Hgb/Hct Trend:  Recent Labs  Lab 03/08/24 1015 03/09/24 0508 03/10/24 0322 03/13/24 0353 03/14/24  0332  HGB 13.2 9.5* 9.3* 9.9* 9.7*  HCT 41.8 27.4* 26.9* 28.9* 28.8*  MCV 89.9 80.6 82.0 82.8 84.5  -Check Anemia Panel in the outpatient setting. CTM for S/Sx of Bleeding; No overt bleeding noted. Repeat CBC in the AM  Non-Severe (Moderate) Malnutrition in the context of chronic illness: Nutrition Status: Nutrition Problem: Moderate Malnutrition Etiology: chronic illness Signs/Symptoms: mild fat  depletion, moderate muscle depletion Interventions: Refer to RD note for recommendations, Magic cup, MVI, Liberalize Diet  Hypoalbuminemia: Patient's Albumin Trend: Recent Labs  Lab 03/08/24 1015 03/09/24 0237 03/11/24 0231 03/12/24 0325 03/13/24 0349 03/14/24 0332  ALBUMIN 4.2 3.1* 3.0* 3.0* 2.8* 2.9*  -Continue to Monitor and Trend and repeat CMP in the AM  GERD/GI Prophylaxis: C/w Famotidine  20 mg po Daily.

## 2024-03-12 NOTE — Plan of Care (Signed)
" °  Problem: Metabolic: Goal: Ability to maintain appropriate glucose levels will improve Outcome: Progressing   Problem: Coping: Goal: Ability to adjust to condition or change in health will improve Outcome: Not Progressing   Problem: Nutritional: Goal: Maintenance of adequate nutrition will improve Outcome: Not Progressing   Problem: Skin Integrity: Goal: Risk for impaired skin integrity will decrease Outcome: Not Progressing   "

## 2024-03-12 NOTE — Progress Notes (Addendum)
 "          PROGRESS NOTE  Joann Campos FMW:[id number] DOB: [birth year] DOA: 03/08/2024 PCP: Tobie Gaines, DO  Brief History:  68 yo F w/ pertinent PMH DMT2, HLD, peripheral neuropathy, anxiety presents to [facility] ED on 12/19 w/ septic shock and aki.   For the past 2 weeks patient has had N/V, generalized mild abd discomfort, poor po intake. Denies fever, diarrhea or constipation. Was seen by urgent care on 12/9 and given fluids. Thought related to viral illness educated to push po fluids. Symptoms persists and came to [facility] ED on 12/19 for further workup. On arrival hypotensive. Afebrile and wbc 19. LA 13. Cultures obtained, given fluids, and started on broad spectrum abx. CT abd/pelvis w/ no acute findings; chronic bibasilar atelectasis, 1-2 mm nonobstructive left renal calculus. Creat 10.9, bun 87, co2 8, AG 47, k 4.2, calcium  10.4. VBG 7.01, 27, 46, 6.8. Nephro consulted. Despite fluids remained hypotensive and started on levo. Pccm consulted for icu admission. BP improved and off vasopressors.  HD catheter removed 12/22.  Transfer to [facility] service on 12/23   Assessment/Plan: Septic and Hypovolemic shock -MRSA neg -UA 21-50 WBC -on empiric ceftriaxone  and metronidazole , plan 5 days -last day abx 12/24 -sepsis physiology resolved CT a/p was neg for acute findings. Bibasilar subsegmental atelectasis. 1-2 mm nonobstructive left renal calculus   UTI -UA 21-50 WBC -ur culture = E coli -finish 5 days abx  AKI -baseline 0.6-0.9 -presented with serum creatinine 10.90 -now off CRRT (d/c on 12/20) -renal following -HD catheter removed 12/22  DM2 with hyperglycemia -A1C--6.8 -continue sliding scale  HLD -continue statin  Hypokalemia -repleting  AGMA -possible metformin  toxiicity -resolved  Peripheral neuropathy -hold gaba  Anxiety -holding prozac   Diarrhea -GIP neg -no abd pain -prn imodium           Family Communication:  no Family at bedside  Consultants:  renal,  PCCM  Code Status:  FULL   DVT Prophylaxis:  [place] Heparin     Procedures: As Listed in Progress Note Above  Antibiotics: Ceftriaxone  12/20>> Flagyl  12/20>>      Subjective: Patient denies fevers, chills, headache, chest pain, dyspnea, nausea, vomiting, diarrhea, abdominal pain, dysuria, hematuria, hematochezia, and melena.   Objective: Vitals:   03/12/24 0300 03/12/24 0400 03/12/24 0500 03/12/24 0600  BP:      Pulse:      Resp: 18 17 15 16   Temp: 98.6 F (37 C)     TempSrc: Oral     SpO2:      Weight:      Height:        Intake/Output Summary (Last 24 hours) at 03/12/2024 9178 Last data filed at 03/12/2024 9377 Gross per 24 hour  Intake 1215.87 ml  Output 4075 ml  Net -2859.13 ml   Weight change:  Exam:  General:  Pt is alert, follows commands appropriately, not in acute distress HEENT: No icterus, No thrush, No neck mass, [place]/AT Cardiovascular: RRR, S1/S2, no rubs, no gallops Respiratory: CTA bilaterally, no wheezing, no crackles, no rhonchi Abdomen: Soft/+BS, non tender, non distended, no guarding Extremities: No edema, No lymphangitis, No petechiae, No rashes, no synovitis   Data Reviewed: I have personally reviewed following labs and imaging studies Basic Metabolic Panel: Recent Labs  Lab 03/08/24 1816 03/09/24 0236 03/09/24 0237 03/09/24 1207 03/10/24 0322 03/10/24 1653 03/11/24 0231 03/11/24 0600 03/11/24 1519 03/12/24 0325  NA  --   --  139   < > 140 142 143  --  146* 144  K  --   --  3.6   < > 3.2* 2.6* 2.7*  --  3.6 2.5*  CL  --   --  96*   < > 104 103 104  --  107 104  CO2  --   --  23   < > 24 21* 23  --  26 28  GLUCOSE  --   --  144*   < > 147* 140* 176*  --  126* 117*  BUN  --   --  51*   < > 23 24* 22  --  20 16  CREATININE  --   --  4.45*   < > 2.61* 2.71* 2.83*  --  2.53* 2.28*  CALCIUM   --   --  7.5*   < > 7.0* 6.9* 7.3*  --  7.4* 7.2*  MG 2.1 1.9  --   --   --   --   --  2.2  --   --   PHOS  --  2.1* 2.1*  --   --   --  1.9*   --   --  3.2   < > = values in this interval not displayed.   Liver Function Tests: Recent Labs  Lab 03/08/24 1015 03/09/24 0237 03/11/24 0231 03/12/24 0325  AST 45*  --   --   --   ALT 32  --   --   --   ALKPHOS 99  --   --   --   BILITOT 0.3  --   --   --   PROT 7.4  --   --   --   ALBUMIN 4.2 3.1* 3.0* 3.0*   Recent Labs  Lab 03/08/24 1015  LIPASE 64*   No results for input(s): AMMONIA in the last 168 hours. Coagulation Profile: No results for input(s): INR, PROTIME in the last 168 hours. CBC: Recent Labs  Lab 03/08/24 1015 03/09/24 0508 03/10/24 0322  WBC 19.6* 20.9* 12.5*  NEUTROABS 16.2*  --   --   HGB 13.2 9.5* 9.3*  HCT 41.8 27.4* 26.9*  MCV 89.9 80.6 82.0  PLT 549* 421* 250   Cardiac Enzymes: No results for input(s): CKTOTAL, CKMB, CKMBINDEX, TROPONINI in the last 168 hours. BNP: Invalid input(s): POCBNP CBG: Recent Labs  Lab 03/11/24 1559 03/11/24 2015 03/11/24 2328 03/12/24 0325 03/12/24 0755  GLUCAP 134* 188* 172* 113* 185*   HbA1C: No results for input(s): HGBA1C in the last 72 hours. Urine analysis:    Component Value Date/Time   COLORURINE YELLOW 03/08/2024 1834   APPEARANCEUR HAZY (A) 03/08/2024 1834   APPEARANCEUR Clear 10/18/2023 0000   LABSPEC 1.009 03/08/2024 1834   PHURINE 5.0 03/08/2024 1834   GLUCOSEU 50 (A) 03/08/2024 1834   HGBUR SMALL (A) 03/08/2024 1834   BILIRUBINUR NEGATIVE 03/08/2024 1834   BILIRUBINUR moderate (A) 02/27/2024 1231   BILIRUBINUR Negative 10/18/2023 0000   KETONESUR 20 (A) 03/08/2024 1834   PROTEINUR 30 (A) 03/08/2024 1834   UROBILINOGEN 0.2 02/27/2024 1231   UROBILINOGEN 0.2 11/04/2019 1746   NITRITE NEGATIVE 03/08/2024 1834   LEUKOCYTESUR SMALL (A) 03/08/2024 1834   Sepsis Labs: @LABRCNTIP (procalcitonin:4,lacticidven:4) ) Recent Results (from the past 240 hours)  Culture, blood (single)     Status: None (Preliminary result)   Collection Time: 03/08/24  2:07 PM   Specimen:  BLOOD RIGHT FOREARM  Result Value Ref Range Status   Specimen Description   Final    BLOOD RIGHT  FOREARM Performed at [facility], [address], [place], [place] [address]    Special Requests   Final    BOTTLES DRAWN AEROBIC AND ANAEROBIC Blood Culture results may not be optimal due to an inadequate volume of blood received in culture bottles Performed at [facility], [address], [place], [place] [address]    Culture   Final    NO GROWTH 3 DAYS Performed at [facility], [address], [place], [place] [address]    Report Status PENDING  Incomplete  MRSA Next Gen by PCR, Nasal     Status: None   Collection Time: 03/08/24  5:33 PM   Specimen: Urine, Clean Catch; Nasal Swab  Result Value Ref Range Status   MRSA by PCR Next Gen NOT DETECTED NOT DETECTED Final    Comment: (NOTE) The GeneXpert MRSA Assay (FDA approved for NASAL specimens only), is one component of a comprehensive MRSA colonization surveillance program. It is not intended to diagnose MRSA infection nor to guide or monitor treatment for MRSA infections. Test performance is not FDA approved in patients less than 4 years old. Performed at [facility], [address] [place], [place] [address]   Respiratory (~20 pathogens) panel by PCR     Status: None   Collection Time: 03/08/24  6:34 PM   Specimen: Urine, Clean Catch; Respiratory  Result Value Ref Range Status   Adenovirus NOT DETECTED NOT DETECTED Final   Coronavirus 229E NOT DETECTED NOT DETECTED Final    Comment: (NOTE) The Coronavirus on the Respiratory Panel, DOES NOT test for the novel  Coronavirus (2019 nCoV)    Coronavirus HKU1 NOT DETECTED NOT DETECTED Final   Coronavirus NL63 NOT DETECTED NOT DETECTED Final   Coronavirus OC43 NOT DETECTED NOT DETECTED Final   Metapneumovirus NOT DETECTED NOT DETECTED Final   Rhinovirus / Enterovirus NOT DETECTED NOT DETECTED Final   Influenza A NOT DETECTED  NOT DETECTED Final   Influenza B NOT DETECTED NOT DETECTED Final   Parainfluenza Virus 1 NOT DETECTED NOT DETECTED Final   Parainfluenza Virus 2 NOT DETECTED NOT DETECTED Final   Parainfluenza Virus 3 NOT DETECTED NOT DETECTED Final   Parainfluenza Virus 4 NOT DETECTED NOT DETECTED Final   Respiratory Syncytial Virus NOT DETECTED NOT DETECTED Final   Bordetella pertussis NOT DETECTED NOT DETECTED Final   Bordetella Parapertussis NOT DETECTED NOT DETECTED Final   Chlamydophila pneumoniae NOT DETECTED NOT DETECTED Final   Mycoplasma pneumoniae NOT DETECTED NOT DETECTED Final    Comment: Performed at [facility], [address] [place], [place] [address]  Urine Culture     Status: Abnormal   Collection Time: 03/08/24  6:34 PM   Specimen: Urine, Random  Result Value Ref Range Status   Specimen Description   Final    URINE, RANDOM Performed at [facility], [address] [place], [place] [address]    Special Requests   Final    NONE Reflexed from [id number] Performed at [facility], [address], [place], [place] [address]    Culture 60,000 COLONIES/mL ESCHERICHIA COLI (A)  Final   Report Status 03/11/2024 FINAL  Final   Organism ID, Bacteria ESCHERICHIA COLI (A)  Final      Susceptibility   Escherichia coli - MIC*    AMPICILLIN >=32 RESISTANT Resistant     CEFAZOLIN (URINE) Value in next row Sensitive      8 SENSITIVEThis is  a modified FDA-approved test that has been validated and its performance characteristics determined by the reporting laboratory.  This laboratory is certified under the Clinical Laboratory Improvement Amendments CLIA as qualified to perform high complexity clinical laboratory testing.    CEFEPIME  Value in next row Sensitive      8 SENSITIVEThis is a modified FDA-approved test that has been validated and its performance characteristics determined by the reporting laboratory.  This laboratory is certified under the  Clinical Laboratory Improvement Amendments CLIA as qualified to perform high complexity clinical laboratory testing.    ERTAPENEM Value in next row Sensitive      8 SENSITIVEThis is a modified FDA-approved test that has been validated and its performance characteristics determined by the reporting laboratory.  This laboratory is certified under the Clinical Laboratory Improvement Amendments CLIA as qualified to perform high complexity clinical laboratory testing.    CEFTRIAXONE  Value in next row Sensitive      8 SENSITIVEThis is a modified FDA-approved test that has been validated and its performance characteristics determined by the reporting laboratory.  This laboratory is certified under the Clinical Laboratory Improvement Amendments CLIA as qualified to perform high complexity clinical laboratory testing.    CIPROFLOXACIN Value in next row Sensitive      8 SENSITIVEThis is a modified FDA-approved test that has been validated and its performance characteristics determined by the reporting laboratory.  This laboratory is certified under the Clinical Laboratory Improvement Amendments CLIA as qualified to perform high complexity clinical laboratory testing.    GENTAMICIN Value in next row Sensitive      8 SENSITIVEThis is a modified FDA-approved test that has been validated and its performance characteristics determined by the reporting laboratory.  This laboratory is certified under the Clinical Laboratory Improvement Amendments CLIA as qualified to perform high complexity clinical laboratory testing.    NITROFURANTOIN  Value in next row Sensitive      8 SENSITIVEThis is a modified FDA-approved test that has been validated and its performance characteristics determined by the reporting laboratory.  This laboratory is certified under the Clinical Laboratory Improvement Amendments CLIA as qualified to perform high complexity clinical laboratory testing.    TRIMETH/SULFA Value in next row Sensitive      8  SENSITIVEThis is a modified FDA-approved test that has been validated and its performance characteristics determined by the reporting laboratory.  This laboratory is certified under the Clinical Laboratory Improvement Amendments CLIA as qualified to perform high complexity clinical laboratory testing.    AMPICILLIN/SULBACTAM Value in next row Resistant      8 SENSITIVEThis is a modified FDA-approved test that has been validated and its performance characteristics determined by the reporting laboratory.  This laboratory is certified under the Clinical Laboratory Improvement Amendments CLIA as qualified to perform high complexity clinical laboratory testing.    PIP/TAZO Value in next row Sensitive      <=4 SENSITIVEThis is a modified FDA-approved test that has been validated and its performance characteristics determined by the reporting laboratory.  This laboratory is certified under the Clinical Laboratory Improvement Amendments CLIA as qualified to perform high complexity clinical laboratory testing.    MEROPENEM Value in next row Sensitive      <=4 SENSITIVEThis is a modified FDA-approved test that has been validated and its performance characteristics determined by the reporting laboratory.  This laboratory is certified under the Clinical Laboratory Improvement Amendments CLIA as qualified to perform high complexity clinical laboratory testing.    * 60,000 COLONIES/mL ESCHERICHIA COLI  Gastrointestinal  Panel by PCR , Stool     Status: None   Collection Time: 03/10/24 12:48 PM   Specimen: Stool  Result Value Ref Range Status   Campylobacter species NOT DETECTED NOT DETECTED Final   Plesimonas shigelloides NOT DETECTED NOT DETECTED Final   Salmonella species NOT DETECTED NOT DETECTED Final   Yersinia enterocolitica NOT DETECTED NOT DETECTED Final   Vibrio species NOT DETECTED NOT DETECTED Final   Vibrio cholerae NOT DETECTED NOT DETECTED Final   Enteroaggregative E coli (EAEC) NOT DETECTED NOT  DETECTED Final   Enteropathogenic E coli (EPEC) NOT DETECTED NOT DETECTED Final   Enterotoxigenic E coli (ETEC) NOT DETECTED NOT DETECTED Final   Shiga like toxin producing E coli (STEC) NOT DETECTED NOT DETECTED Final   Shigella/Enteroinvasive E coli (EIEC) NOT DETECTED NOT DETECTED Final   Cryptosporidium NOT DETECTED NOT DETECTED Final   Cyclospora cayetanensis NOT DETECTED NOT DETECTED Final   Entamoeba histolytica NOT DETECTED NOT DETECTED Final   Giardia lamblia NOT DETECTED NOT DETECTED Final   Adenovirus F40/41 NOT DETECTED NOT DETECTED Final   Astrovirus NOT DETECTED NOT DETECTED Final   Norovirus GI/GII NOT DETECTED NOT DETECTED Final   Rotavirus A NOT DETECTED NOT DETECTED Final   Sapovirus (I, II, IV, and V) NOT DETECTED NOT DETECTED Final    Comment: Performed at [facility], [address] [place], [place] [address]     Scheduled Meds:  (feeding supplement) PROSource Plus  30 mL Oral BID   Chlorhexidine  Gluconate Cloth  6 each Topical Daily   famotidine   20 mg Oral Daily   feeding supplement  237 mL Oral TID BM   Gerhardt's butt cream   Topical BID   heparin   5,000 Units Subcutaneous Q8H   insulin  aspart  0-9 Units Subcutaneous Q4H   multivitamin  1 tablet Oral BID   potassium chloride   60 mEq Oral Q4H   rosuvastatin   20 mg Oral Daily   thiamine   100 mg Oral Daily   zinc  sulfate (50mg  elemental zinc )  220 mg Oral Daily   Continuous Infusions:  cefTRIAXone  (ROCEPHIN )  IV Stopped (03/11/24 1033)   metronidazole  Stopped (03/12/24 0021)    Procedures/Studies: CT HEAD WO CONTRAST ( ) Result Date: 03/10/2024 CLINICAL DATA:  Initial evaluation for acute altered mental status. EXAM: CT HEAD WITHOUT CONTRAST TECHNIQUE: Contiguous axial images were obtained from the base of the skull through the vertex without intravenous contrast. RADIATION DOSE REDUCTION: This exam was performed according to the departmental dose-optimization program which includes automated  exposure control, adjustment of the mA and/or kV according to patient size and/or use of iterative reconstruction technique. COMPARISON:  None Available. FINDINGS: Brain: Cerebral volume within normal limits for patient age. No acute intracranial hemorrhage. No acute large vessel territory infarct. No mass lesion, midline shift, or mass effect. Ventricles are normal in size without hydrocephalus. No extra-axial fluid collection. Vascular: No abnormal hyperdense vessel. Skull: Scalp soft tissues demonstrate no acute abnormality. Calvarium intact. Sinuses/Orbits: Globes and orbital soft tissues within normal limits. Trace layering secretions present within the left sphenoid sinus. Paranasal sinuses are otherwise clear. No mastoid effusion. IMPRESSION: Normal head CT. No acute intracranial abnormality. Electronically Signed   By: Morene Hoard M.D.   On: 03/10/2024 19:44   DG Chest Portable 1 View Result Date: 03/08/2024 CLINICAL DATA:  Central line placement. EXAM: PORTABLE CHEST 1 VIEW COMPARISON:  None Available. FINDINGS: Right internal jugular central line tip overlies the lower SVC. No pneumothorax. The heart is  borderline enlarged. Lower thorax not entirely included in the field of view. No large pleural effusion. No confluent opacity. No evidence of pulmonary edema. On limited assessment, no acute osseous findings. IMPRESSION: Right internal jugular central line tip overlies the lower SVC. No pneumothorax. Electronically Signed   By: Andrea Gasman M.D.   On: 03/08/2024 17:10   US  RENAL Result Date: 03/08/2024 CLINICAL DATA:  Acute kidney injury. EXAM: RENAL / URINARY TRACT ULTRASOUND COMPLETE COMPARISON:  None Available. FINDINGS: Right Kidney: Renal measurements: 12.0 cm x 5.1 cm x 4.8 cm = volume: 154.2 mL. Echogenicity within normal limits. No mass or hydronephrosis visualized. Left Kidney: Renal measurements: 12.7 cm x 5.4 cm x 5.5 cm = volume: 198 mL. Echogenicity within normal limits. No  mass or hydronephrosis visualized. Bladder: Appears normal for degree of bladder distention. Other: None. IMPRESSION: Unremarkable renal ultrasound. Electronically Signed   By: Suzen Dials M.D.   On: 03/08/2024 16:13   CT ABDOMEN PELVIS WO CONTRAST Result Date: 03/08/2024 EXAM: CT ABDOMEN AND PELVIS WITHOUT CONTRAST 03/08/2024 12:06:27 PM TECHNIQUE: CT of the abdomen and pelvis was performed without the administration of intravenous contrast. Multiplanar reformatted images are provided for review. Automated exposure control, iterative reconstruction, and/or weight-based adjustment of the mA/kV was utilized to reduce the radiation dose to as low as reasonably achievable. COMPARISON: 10/04/2023 CLINICAL HISTORY: Abdominal pain, acute, nonlocalized. FINDINGS: LOWER CHEST: Subsegmental atelectasis in both lower lobes. LIVER: The liver is unremarkable. GALLBLADDER AND BILE DUCTS: Cholecystectomy. No biliary ductal dilatation. SPLEEN: No acute abnormality. PANCREAS: No acute abnormality. ADRENAL GLANDS: No acute abnormality. KIDNEYS, URETERS AND BLADDER: 1 to 2 mm left mid kidney nonobstructive renal calculus, image 124 series 9. No stones in the right kidney or ureters. No hydronephrosis. No perinephric or periureteral stranding. Urinary bladder is unremarkable. GI AND BOWEL: Mild type 1 hiatal hernia. Mild sigmoid colon diverticulosis. Mostly gasless bowel without bowel dilatation noted. Stomach demonstrates no acute abnormality. There is no bowel obstruction. PERITONEUM AND RETROPERITONEUM: No ascites. No free air. VASCULATURE: Systemic atherosclerosis is present, including the aorta and iliac arteries. LYMPH NODES: No lymphadenopathy. REPRODUCTIVE ORGANS: No acute abnormality. BONES AND SOFT TISSUES: Transitional S1 vertebra. A small immediately supraumbilical hernia contains adipose tissue. No acute osseous abnormality. No focal soft tissue abnormality. IMPRESSION: 1. No acute findings in the abdomen or  pelvis. 2. Chronic and incidental findings include bibasilar subsegmental atelectasis, prior cholecystectomy, mild type 1 hiatal hernia, a 1 to 2 mm nonobstructive left renal calculus, mild sigmoid diverticulosis, systemic atherosclerosis including the aorta and iliac arteries, transitional S1 vertebra, and a small supraumbilical fat-containing hernia. Electronically signed by: Ryan Salvage MD 03/08/2024 12:54 PM EST RP Workstation: [id number]    Alm Schneider, DO  [facility]  If 7PM-7AM, please contact night-coverage [email or web address] Password [email or web address] 03/12/2024, 8:21 AM   LOS: 4 days   "

## 2024-03-12 NOTE — Progress Notes (Addendum)
 Nutrition Follow-up  DOCUMENTATION CODES:   Non-severe (moderate) malnutrition in context of chronic illness  INTERVENTION:  - Recommend removing Renal diet restriction and liberalizing diet to Carb Modified to avoid restricting intake given malnutrition and poor oral intake.  - Discontinue Ensure and ProSource Plus, patient does not like them and has not been consuming them.   - Trial Magic Cup BID with meals to support intake. Each supplement provides 290 kcal and 9 grams of protein  - Rena-vit once daily.  - 100mg  thiamine  daily.  - 220mg  zinc  x14 days due to significant GI losses  - Monitor bowel movements. If diarrhea persists recommend Banatrol fiber supplement BID. Each provides 45 kcals, 2g protein and 5g soluble fiber to aid diarrhea.  - Monitor weight trends.  NUTRITION DIAGNOSIS:   Moderate Malnutrition related to chronic illness as evidenced by mild fat depletion, moderate muscle depletion. *new  GOAL:   Patient will meet greater than or equal to 90% of their needs *progressing  MONITOR:   PO intake, Supplement acceptance, Labs, Weight trends  REASON FOR ASSESSMENT:   Consult Assessment of nutrition requirement/status (CRRT)  ASSESSMENT:   68 yo female presented to [facility] ED with 2 weeks of N/V, abdominal pain and poor po intake and admitted with severe pre-renal AKI secondary to dehydration with severe metabolic acidosis requiring CRRT, hypovolemic shock. PMH includes DM, peripheral neuropathy, HLD, anxiety  12/19 Admit; CRRT initiated 12/20 CRRT discontinued  Patient reports a UBW of 127# and that she has been gaining over the past several months.  Per EMR, weight fairly stable over the past year with no significant changes in weight.   Patient endorses not eating full meals at her baseline, she is more of a snacker. However, she reports that over the past ~1 month she has not been eating very much due to a poor appetite and not feeling well. Husband at  bedside shares she didn't eat anything in the 1 week leading up to admission.   Husband reports the patient has not eaten very much since admission. Patient herself states she wasn't aware she was on a diet. Informed patient of diet being ordered and that she can order whenever desired. Encouraged her to aim to order something at all 3 meals daily. She has been ordered Ensure TID and ProSource Plus BID but refusing all. She reports not liking them at all. Thankfully, she endorses loving ice cream and being agreeable to try [employer].  Reached out to MD about liberalizing diet and removing renal diet restriction given malnutrition and poor oral intake. No response yet at this time. Patient shares she does not eat chicken, beef, or pork. Will add this to the ordering system so these items are not sent.   Admit weight: 130# Current weight: 142# I&O's: +4.8L since admit  Medications reviewed and include: Rena-vit, 100mg  thiamine , 220mg  zinc  (x14 days)  Labs reviewed:  K+ 2.5 Creatinine 2.28 HA1C 6.8  Blood Glucose 113-318 x24 hours   NUTRITION - FOCUSED PHYSICAL EXAM:  Flowsheet Row Most Recent Value  Orbital Region Mild depletion  Upper Arm Region No depletion  Thoracic and Lumbar Region No depletion  Buccal Region Mild depletion  Temple Region Mild depletion  Clavicle Bone Region Moderate depletion  Clavicle and Acromion Bone Region Moderate depletion  Scapular Bone Region Unable to assess  Dorsal Hand No depletion  Patellar Region Mild depletion  Anterior Thigh Region Mild depletion  Posterior Calf Region Mild depletion  Edema (RD Assessment) None  Hair Reviewed  Eyes Reviewed  Mouth Reviewed  Skin Reviewed  Nails Reviewed    Diet Order:   Diet Order             Diet renal/carb modified with fluid restriction Fluid restriction: Other (see comments); Room service appropriate? Yes; Fluid consistency: Thin  Diet effective now                   EDUCATION NEEDS:   Education needs have been addressed  Skin:  Skin Assessment: Reviewed RN Assessment  Last BM:  12/23 - type 7  Height:  Ht Readings from Last 1 Encounters:  03/08/24 5' 5 (1.651 m)   Weight:  Wt Readings from Last 1 Encounters:  03/10/24 64.5 kg    BMI:  Body mass index is 23.66 kg/m.  Estimated Nutritional Needs:  Kcal:  1700-1900 kcals Protein:  70-80 grams Fluid:  >/= 2L    Trude Ned RD, LDN Contact via Secure Chat.

## 2024-03-13 DIAGNOSIS — T383X5A Adverse effect of insulin and oral hypoglycemic [antidiabetic] drugs, initial encounter: Secondary | ICD-10-CM | POA: Diagnosis not present

## 2024-03-13 DIAGNOSIS — N179 Acute kidney failure, unspecified: Secondary | ICD-10-CM | POA: Diagnosis not present

## 2024-03-13 DIAGNOSIS — E440 Moderate protein-calorie malnutrition: Secondary | ICD-10-CM

## 2024-03-13 DIAGNOSIS — R6521 Severe sepsis with septic shock: Secondary | ICD-10-CM | POA: Diagnosis not present

## 2024-03-13 DIAGNOSIS — A419 Sepsis, unspecified organism: Secondary | ICD-10-CM | POA: Diagnosis not present

## 2024-03-13 DIAGNOSIS — E872 Acidosis, unspecified: Secondary | ICD-10-CM | POA: Diagnosis not present

## 2024-03-13 LAB — BASIC METABOLIC PANEL WITH GFR
Anion gap: 11 (ref 5–15)
BUN: 16 mg/dL (ref 8–23)
CO2: 24 mmol/L (ref 22–32)
Calcium: 7.5 mg/dL — ABNORMAL LOW (ref 8.9–10.3)
Chloride: 111 mmol/L (ref 98–111)
Creatinine, Ser: 1.93 mg/dL — ABNORMAL HIGH (ref 0.44–1.00)
GFR, Estimated: 28 mL/min — ABNORMAL LOW
Glucose, Bld: 171 mg/dL — ABNORMAL HIGH (ref 70–99)
Potassium: 3 mmol/L — ABNORMAL LOW (ref 3.5–5.1)
Sodium: 146 mmol/L — ABNORMAL HIGH (ref 135–145)

## 2024-03-13 LAB — CBC WITH DIFFERENTIAL/PLATELET
Abs Immature Granulocytes: 0.02 K/uL (ref 0.00–0.07)
Basophils Absolute: 0 K/uL (ref 0.0–0.1)
Basophils Relative: 1 %
Eosinophils Absolute: 0.1 K/uL (ref 0.0–0.5)
Eosinophils Relative: 1 %
HCT: 28.9 % — ABNORMAL LOW (ref 36.0–46.0)
Hemoglobin: 9.9 g/dL — ABNORMAL LOW (ref 12.0–15.0)
Immature Granulocytes: 0 %
Lymphocytes Relative: 35 %
Lymphs Abs: 2.3 K/uL (ref 0.7–4.0)
MCH: 28.4 pg (ref 26.0–34.0)
MCHC: 34.3 g/dL (ref 30.0–36.0)
MCV: 82.8 fL (ref 80.0–100.0)
Monocytes Absolute: 0.6 K/uL (ref 0.1–1.0)
Monocytes Relative: 9 %
Neutro Abs: 3.6 K/uL (ref 1.7–7.7)
Neutrophils Relative %: 54 %
Platelets: 253 K/uL (ref 150–400)
RBC: 3.49 MIL/uL — ABNORMAL LOW (ref 3.87–5.11)
RDW: 14 % (ref 11.5–15.5)
WBC: 6.6 K/uL (ref 4.0–10.5)
nRBC: 0 % (ref 0.0–0.2)

## 2024-03-13 LAB — MAGNESIUM: Magnesium: 1.5 mg/dL — ABNORMAL LOW (ref 1.7–2.4)

## 2024-03-13 LAB — GLUCOSE, CAPILLARY
Glucose-Capillary: 100 mg/dL — ABNORMAL HIGH (ref 70–99)
Glucose-Capillary: 163 mg/dL — ABNORMAL HIGH (ref 70–99)
Glucose-Capillary: 165 mg/dL — ABNORMAL HIGH (ref 70–99)
Glucose-Capillary: 169 mg/dL — ABNORMAL HIGH (ref 70–99)
Glucose-Capillary: 247 mg/dL — ABNORMAL HIGH (ref 70–99)
Glucose-Capillary: 255 mg/dL — ABNORMAL HIGH (ref 70–99)
Glucose-Capillary: 309 mg/dL — ABNORMAL HIGH (ref 70–99)

## 2024-03-13 LAB — CULTURE, BLOOD (SINGLE): Culture: NO GROWTH

## 2024-03-13 LAB — RENAL FUNCTION PANEL
Albumin: 2.8 g/dL — ABNORMAL LOW (ref 3.5–5.0)
Anion gap: 10 (ref 5–15)
BUN: 16 mg/dL (ref 8–23)
CO2: 25 mmol/L (ref 22–32)
Calcium: 7.6 mg/dL — ABNORMAL LOW (ref 8.9–10.3)
Chloride: 111 mmol/L (ref 98–111)
Creatinine, Ser: 1.93 mg/dL — ABNORMAL HIGH (ref 0.44–1.00)
GFR, Estimated: 28 mL/min — ABNORMAL LOW
Glucose, Bld: 174 mg/dL — ABNORMAL HIGH (ref 70–99)
Phosphorus: 2.5 mg/dL (ref 2.5–4.6)
Potassium: 3 mmol/L — ABNORMAL LOW (ref 3.5–5.1)
Sodium: 146 mmol/L — ABNORMAL HIGH (ref 135–145)

## 2024-03-13 MED ORDER — POTASSIUM CHLORIDE CRYS ER 20 MEQ PO TBCR
40.0000 meq | EXTENDED_RELEASE_TABLET | Freq: Once | ORAL | Status: AC
  Administered 2024-03-13: 40 meq via ORAL
  Filled 2024-03-13: qty 2

## 2024-03-13 MED ORDER — MAGNESIUM SULFATE 2 GM/50ML IV SOLN
2.0000 g | Freq: Once | INTRAVENOUS | Status: AC
  Administered 2024-03-13: 2 g via INTRAVENOUS
  Filled 2024-03-13: qty 50

## 2024-03-13 NOTE — Plan of Care (Signed)
" °  Problem: Fluid Volume: Goal: Ability to maintain a balanced intake and output will improve Outcome: Progressing   Problem: Metabolic: Goal: Ability to maintain appropriate glucose levels will improve Outcome: Progressing   Problem: Nutritional: Goal: Maintenance of adequate nutrition will improve Outcome: Progressing   Problem: Clinical Measurements: Goal: Diagnostic test results will improve Outcome: Progressing Goal: Respiratory complications will improve Outcome: Progressing Goal: Cardiovascular complication will be avoided Outcome: Progressing   Problem: Nutrition: Goal: Adequate nutrition will be maintained Outcome: Progressing   Problem: Coping: Goal: Level of anxiety will decrease Outcome: Progressing   Problem: Elimination: Goal: Will not experience complications related to bowel motility Outcome: Progressing Goal: Will not experience complications related to urinary retention Outcome: Progressing   "

## 2024-03-13 NOTE — Progress Notes (Signed)
 " PROGRESS NOTE    Joann Campos  FMW:[id number] DOB: 1955-04-26 DOA: 03/08/2024 PCP: Tobie Gaines, DO   Brief Narrative:  68 yo F w/ pertinent PMH DMT2, HLD, peripheral neuropathy, anxiety presents to [facility] ED on 12/19 w/ septic shock and aki.   For the past 2 weeks patient has had N/V, generalized mild abd discomfort, poor po intake. Denies fever, diarrhea or constipation. Was seen by urgent care on 12/9 and given fluids. Thought related to viral illness educated to push po fluids. Symptoms persists and came to [facility] ED on 12/19 for further workup. On arrival hypotensive. Afebrile and wbc 19. LA 13. Cultures obtained, given fluids, and started on broad spectrum abx. CT abd/pelvis w/ no acute findings; chronic bibasilar atelectasis, 1-2 mm nonobstructive left renal calculus. Creat 10.9, bun 87, co2 8, AG 47, k 4.2, calcium  10.4. VBG 7.01, 27, 46, 6.8. Nephro consulted. Despite fluids remained hypotensive and started on levo. Pccm consulted for icu admission. BP improved and off vasopressors.  HD catheter removed 12/22.  Transferred to [facility] service on 12/23  Assessment and Plan:  Septic and Hypovolemic Shock: MRSA neg -UA 21-50 WBC -on empiric ceftriaxone  and metronidazole , plan 5 days with last day abx 12/24 -sepsis physiology resolved -CT a/p was neg for acute findings. Bibasilar subsegmental atelectasis. 1-2 mm nonobstructive left renal calculus  -PT/OT to evaluate and Treat   E Coli UTI: UA 21-50 WBC -Urine Culture = E coli -WBC went from 12.5 -> 6.6 -finish 5 days abx of IV Ceftriaxone  1 gram q24h   AKI: Baseline 0.6-0.9. -BUN/Cr Trend: Recent Labs  Lab 03/10/24 0322 03/10/24 1653 03/11/24 0231 03/11/24 1519 03/12/24 0325 03/13/24 0349 03/13/24 0353  BUN 23 24* 22 20 16 16 16   CREATININE 2.61* 2.71* 2.83* 2.53* 2.28* 1.93* 1.93*  -Presented Serum Cr of 10.90. Nephrology consulted and was on CRRT and now this was D/C'd 12/20. HD Catheter Removed 12/22 -Avoid Nephrotoxic  Medications, Contrast Dyes, Hypotension and Dehydration to Ensure Adequate Renal Perfusion and will need to Renally Adjust Meds -Continue to Monitor and Trend Renal Function carefully and repeat CMP in the AM    DM2 with Hyperglycemia:  HbA1c was 6.8. Continue Sensitive Novolog  SSI AC 0-9 q4h. CTM CBGs per Protocol. CBG Trend:  Recent Labs  Lab 03/12/24 1953 03/13/24 0036 03/13/24 0424 03/13/24 0827 03/13/24 1109 03/13/24 1524 03/13/24 1945  GLUCAP 144* 309* 165* 100* 255* 163* 169*    Hypernatremia: Na+ is 146. CTM and Trend and repeat CMP in the AM  HLD: Continue Rosuvastatin  20 mg po Daily    Hypokalemia: K+ is again 3.0. CTM and replete w/ po KCL 40 mEQ x1.   Hypomagesemia: Mag Level is 1.5. Replete w/ IV Mag Sulfate 2 grams. CTM and Replete as Necessary   AGMA: Resolved. Possible Metformin  Toxicity induced Lactic Acidosis. Resolved as CO2 is 24, Chloride Level is 111, and AG is 11. CTM and trend and repeat CMP in the AM    Peripheral Neuropathy: Resumed Gabapentin  200 m po qHS   Anxiety: Was holding Fluoxetine  10 mg po daily but resumed 12/23   Diarrhea: GIP neg and has no abd pain. C/w prn imodium   Normocytic Anemia: Hgb/Hct Trend:  Recent Labs  Lab 03/08/24 1015 03/09/24 0508 03/10/24 0322 03/13/24 0353  HGB 13.2 9.5* 9.3* 9.9*  HCT 41.8 27.4* 26.9* 28.9*  MCV 89.9 80.6 82.0 82.8  -Check Anemia Panel in the AM. CTM for S/Sx of Bleeding; No overt bleeding noted. Repeat CBC in the  AM  Non-Severe (Moderate) Malnutrition in the context of chronic illness: Nutrition Status: Nutrition Problem: Moderate Malnutrition Etiology: chronic illness Signs/Symptoms: mild fat depletion, moderate muscle depletion Interventions: Refer to RD note for recommendations, Magic cup, MVI, Liberalize Diet  Hypoalbuminemia: Patient's Albumin Trend: Recent Labs  Lab 03/08/24 1015 03/09/24 0237 03/11/24 0231 03/12/24 0325 03/13/24 0349  ALBUMIN 4.2 3.1* 3.0* 3.0* 2.8*  -Continue to  Monitor and Trend and repeat CMP in the AM  GERD/GI Prophylaxis: C/w Famotidine  20 mg po Daily.   DVT prophylaxis: heparin  injection 5,000 Units Start: 03/08/24 1445    Code Status: Full Code Family Communication: No family present @ bedside   Disposition Plan:  Level of care: Progressive Status is: Inpatient Remains inpatient appropriate because: Needs further clinical improvement and evaluation by PT/OT to further evaluate and Treat   Consultants:  PCCM Nephrology  Procedures:  As delineated as above  Antimicrobials:  Anti-infectives (From admission, onward)    Start     Dose/Rate Route Frequency Ordered Stop   03/10/24 1500  ceFEPIme  (MAXIPIME ) 2 g in sodium chloride  0.9 % 100 mL IVPB  Status:  Discontinued        2 g 200 mL/hr over 30 Minutes Intravenous Every 24 hours 03/09/24 1653 03/10/24 0925   03/10/24 1015  cefTRIAXone  (ROCEPHIN ) 1 g in sodium chloride  0.9 % 100 mL IVPB        1 g 200 mL/hr over 30 Minutes Intravenous Every 24 hours 03/10/24 0925     03/09/24 0300  ceFEPIme  (MAXIPIME ) 2 g in sodium chloride  0.9 % 100 mL IVPB  Status:  Discontinued        2 g 200 mL/hr over 30 Minutes Intravenous Every 12 hours 03/08/24 1512 03/09/24 1653   03/08/24 2200  linezolid  (ZYVOX ) IVPB 600 mg  Status:  Discontinued        600 mg 300 mL/hr over 60 Minutes Intravenous Every 12 hours 03/08/24 1512 03/10/24 0925   03/08/24 2200  metroNIDAZOLE  (FLAGYL ) IVPB 500 mg        500 mg 100 mL/hr over 60 Minutes Intravenous Every 12 hours 03/08/24 1512     03/08/24 1400  ceFEPIme  (MAXIPIME ) 2 g in sodium chloride  0.9 % 100 mL IVPB  Status:  Discontinued        2 g 200 mL/hr over 30 Minutes Intravenous  Once 03/08/24 1345 03/08/24 1349   03/08/24 1400  metroNIDAZOLE  (FLAGYL ) IVPB 500 mg        500 mg 100 mL/hr over 60 Minutes Intravenous  Once 03/08/24 1345 03/08/24 1502   03/08/24 1400  vancomycin  (VANCOCIN ) IVPB 1000 mg/200 mL premix        1,000 mg 200 mL/hr over 60 Minutes  Intravenous  Once 03/08/24 1345 03/08/24 1506   03/08/24 1400  ceFEPIme  (MAXIPIME ) 1 g in sodium chloride  0.9 % 100 mL IVPB        1 g 200 mL/hr over 30 Minutes Intravenous  Once 03/08/24 1349 03/08/24 1530       Subjective: Seen and examined at bedside and she is doing okay and asking about going home.  States that she is weak.  No nausea or vomiting.  Denies lightheadedness or dizziness.  Feels okay otherwise.  Objective: Vitals:   03/13/24 1700 03/13/24 1800 03/13/24 1950 03/13/24 2000  BP:    (!) 149/93  Pulse: 87 81  (!) 101  Resp: 17 19  17   Temp:   97.9 F (36.6 C) 97.9 F (36.6 C)  TempSrc:   Oral Oral  SpO2: 98% 95%  98%  Weight:      Height:        Intake/Output Summary (Last 24 hours) at 03/13/2024 2058 Last data filed at 03/13/2024 1317 Gross per 24 hour  Intake 350 ml  Output 1150 ml  Net -800 ml   Filed Weights   03/09/24 0500 03/10/24 0500 03/13/24 0500  Weight: 64.6 kg 64.5 kg 62.6 kg    Examination: Physical Exam:  Constitutional: Caucasian female in no acute distress Respiratory: Diminished to auscultation bilaterally, no wheezing, rales, rhonchi or crackles. Normal respiratory effort and patient is not tachypenic. No accessory muscle use.  Unlabored breathing Cardiovascular: RRR, no murmurs / rubs / gallops. S1 and S2 auscultated. No extremity edema.  Abdomen: Soft, non-tender, non-distended. Bowel sounds positive.  GU: Deferred. Musculoskeletal: No clubbing / cyanosis of digits/nails. No joint deformity upper and lower extremities.  Skin: No rashes, lesions, ulcers on limited skin evaluation. No induration; Warm and dry.  Neurologic: CN 2-12 grossly intact with no focal deficits. Romberg sign and cerebellar reflexes not assessed.  Psychiatric: Normal judgment and insight. Alert and oriented x 3. Normal mood and appropriate affect.   Data Reviewed: I have personally reviewed following labs and imaging studies  CBC: Recent Labs  Lab  03/08/24 1015 03/09/24 0508 03/10/24 0322 03/13/24 0353  WBC 19.6* 20.9* 12.5* 6.6  NEUTROABS 16.2*  --   --  3.6  HGB 13.2 9.5* 9.3* 9.9*  HCT 41.8 27.4* 26.9* 28.9*  MCV 89.9 80.6 82.0 82.8  PLT 549* 421* 250 253   Basic Metabolic Panel: Recent Labs  Lab 03/08/24 1816 03/09/24 0236 03/09/24 0237 03/09/24 1207 03/11/24 0231 03/11/24 0600 03/11/24 1519 03/12/24 0325 03/13/24 0349 03/13/24 0353  NA  --   --  139   < > 143  --  146* 144 146* 146*  K  --   --  3.6   < > 2.7*  --  3.6 2.5* 3.0* 3.0*  CL  --   --  96*   < > 104  --  107 104 111 111  CO2  --   --  23   < > 23  --  26 28 25 24   GLUCOSE  --   --  144*   < > 176*  --  126* 117* 174* 171*  BUN  --   --  51*   < > 22  --  20 16 16 16   CREATININE  --   --  4.45*   < > 2.83*  --  2.53* 2.28* 1.93* 1.93*  CALCIUM   --   --  7.5*   < > 7.3*  --  7.4* 7.2* 7.6* 7.5*  MG 2.1 1.9  --   --   --  2.2  --   --   --  1.5*  PHOS  --  2.1* 2.1*  --  1.9*  --   --  3.2 2.5  --    < > = values in this interval not displayed.   GFR: Estimated Creatinine Clearance: 25.8 mL/min (A) (by C-G formula based on SCr of 1.93 mg/dL (H)). Liver Function Tests: Recent Labs  Lab 03/08/24 1015 03/09/24 0237 03/11/24 0231 03/12/24 0325 03/13/24 0349  AST 45*  --   --   --   --   ALT 32  --   --   --   --   ALKPHOS 99  --   --   --   --  BILITOT 0.3  --   --   --   --   PROT 7.4  --   --   --   --   ALBUMIN 4.2 3.1* 3.0* 3.0* 2.8*   Recent Labs  Lab 03/08/24 1015  LIPASE 64*   No results for input(s): AMMONIA in the last 168 hours. Coagulation Profile: No results for input(s): INR, PROTIME in the last 168 hours. Cardiac Enzymes: No results for input(s): CKTOTAL, CKMB, CKMBINDEX, TROPONINI in the last 168 hours. BNP (last 3 results) No results for input(s): PROBNP in the last 8760 hours. HbA1C: No results for input(s): HGBA1C in the last 72 hours. CBG: Recent Labs  Lab 03/13/24 0424 03/13/24 0827  03/13/24 1109 03/13/24 1524 03/13/24 1945  GLUCAP 165* 100* 255* 163* 169*   Lipid Profile: No results for input(s): CHOL, HDL, LDLCALC, TRIG, CHOLHDL, LDLDIRECT in the last 72 hours. Thyroid Function Tests: No results for input(s): TSH, T4TOTAL, FREET4, T3FREE, THYROIDAB in the last 72 hours. Anemia Panel: No results for input(s): VITAMINB12, FOLATE, FERRITIN, TIBC, IRON, RETICCTPCT in the last 72 hours. Sepsis Labs: Recent Labs  Lab 03/08/24 1332 03/08/24 1816 03/08/24 2230 03/09/24 0939  PROCALCITON  --  1.42  --   --   LATICACIDVEN 13.6* >9.0* 5.8* 1.1    Recent Results (from the past 240 hours)  Culture, blood (single)     Status: None   Collection Time: 03/08/24  2:07 PM   Specimen: BLOOD RIGHT FOREARM  Result Value Ref Range Status   Specimen Description   Final    BLOOD RIGHT FOREARM Performed at [facility], [address], [place], [place] [address]    Special Requests   Final    BOTTLES DRAWN AEROBIC AND ANAEROBIC Blood Culture results may not be optimal due to an inadequate volume of blood received in culture bottles Performed at [facility], [address] [place], [place] [address]    Culture   Final    NO GROWTH 5 DAYS Performed at [facility], [address], [place], [place] [address]    Report Status 03/13/2024 FINAL  Final  MRSA Next Gen by PCR, Nasal     Status: None   Collection Time: 03/08/24  5:33 PM   Specimen: Urine, Clean Catch; Nasal Swab  Result Value Ref Range Status   MRSA by PCR Next Gen NOT DETECTED NOT DETECTED Final    Comment: (NOTE) The GeneXpert MRSA Assay (FDA approved for NASAL specimens only), is one component of a comprehensive MRSA colonization surveillance program. It is not intended to diagnose MRSA infection nor to guide or monitor treatment for MRSA infections. Test performance is not FDA approved in patients less than 40 years old. Performed at [facility], [address], [place], [place] [address]   Respiratory (~20 pathogens) panel by PCR     Status: None   Collection Time: 03/08/24  6:34 PM   Specimen: Urine, Clean Catch; Respiratory  Result Value Ref Range Status   Adenovirus NOT DETECTED NOT DETECTED Final   Coronavirus 229E NOT DETECTED NOT DETECTED Final    Comment: (NOTE) The Coronavirus on the Respiratory Panel, DOES NOT test for the novel  Coronavirus (2019 nCoV)    Coronavirus HKU1 NOT DETECTED NOT DETECTED Final   Coronavirus NL63 NOT DETECTED NOT DETECTED Final   Coronavirus OC43 NOT DETECTED NOT DETECTED Final   Metapneumovirus NOT DETECTED NOT DETECTED Final   Rhinovirus / Enterovirus NOT  DETECTED NOT DETECTED Final   Influenza A NOT DETECTED NOT DETECTED Final   Influenza B NOT DETECTED NOT DETECTED Final   Parainfluenza Virus 1 NOT DETECTED NOT DETECTED Final   Parainfluenza Virus 2 NOT DETECTED NOT DETECTED Final   Parainfluenza Virus 3 NOT DETECTED NOT DETECTED Final   Parainfluenza Virus 4 NOT DETECTED NOT DETECTED Final   Respiratory Syncytial Virus NOT DETECTED NOT DETECTED Final   Bordetella pertussis NOT DETECTED NOT DETECTED Final   Bordetella Parapertussis NOT DETECTED NOT DETECTED Final   Chlamydophila pneumoniae NOT DETECTED NOT DETECTED Final   Mycoplasma pneumoniae NOT DETECTED NOT DETECTED Final    Comment: Performed at [facility], [address] [place], [place] [address]  Urine Culture     Status: Abnormal   Collection Time: 03/08/24  6:34 PM   Specimen: Urine, Random  Result Value Ref Range Status   Specimen Description   Final    URINE, RANDOM Performed at [facility], [address], [place], [place] [address]    Special Requests   Final    NONE Reflexed from [id number] Performed at [facility], [address], [place], [place] [address]    Culture 60,000 COLONIES/mL ESCHERICHIA COLI (A)  Final   Report Status 03/11/2024  FINAL  Final   Organism ID, Bacteria ESCHERICHIA COLI (A)  Final      Susceptibility   Escherichia coli - MIC*    AMPICILLIN >=32 RESISTANT Resistant     CEFAZOLIN (URINE) Value in next row Sensitive      8 SENSITIVEThis is a modified FDA-approved test that has been validated and its performance characteristics determined by the reporting laboratory.  This laboratory is certified under the Clinical Laboratory Improvement Amendments CLIA as qualified to perform high complexity clinical laboratory testing.    CEFEPIME  Value in next row Sensitive      8 SENSITIVEThis is a modified FDA-approved test that has been validated and its performance characteristics determined by the reporting laboratory.  This laboratory is certified under the Clinical Laboratory Improvement Amendments CLIA as qualified to perform high complexity clinical laboratory testing.    ERTAPENEM Value in next row Sensitive      8 SENSITIVEThis is a modified FDA-approved test that has been validated and its performance characteristics determined by the reporting laboratory.  This laboratory is certified under the Clinical Laboratory Improvement Amendments CLIA as qualified to perform high complexity clinical laboratory testing.    CEFTRIAXONE  Value in next row Sensitive      8 SENSITIVEThis is a modified FDA-approved test that has been validated and its performance characteristics determined by the reporting laboratory.  This laboratory is certified under the Clinical Laboratory Improvement Amendments CLIA as qualified to perform high complexity clinical laboratory testing.    CIPROFLOXACIN Value in next row Sensitive      8 SENSITIVEThis is a modified FDA-approved test that has been validated and its performance characteristics determined by the reporting laboratory.  This laboratory is certified under the Clinical Laboratory Improvement Amendments CLIA as qualified to perform high complexity clinical laboratory testing.    GENTAMICIN  Value in next row Sensitive      8 SENSITIVEThis is a modified FDA-approved test that has been validated and its performance characteristics determined by the reporting laboratory.  This laboratory is certified under the Clinical Laboratory Improvement Amendments CLIA as qualified to perform high complexity clinical laboratory testing.    NITROFURANTOIN  Value in next row Sensitive  8 SENSITIVEThis is a modified FDA-approved test that has been validated and its performance characteristics determined by the reporting laboratory.  This laboratory is certified under the Clinical Laboratory Improvement Amendments CLIA as qualified to perform high complexity clinical laboratory testing.    TRIMETH/SULFA Value in next row Sensitive      8 SENSITIVEThis is a modified FDA-approved test that has been validated and its performance characteristics determined by the reporting laboratory.  This laboratory is certified under the Clinical Laboratory Improvement Amendments CLIA as qualified to perform high complexity clinical laboratory testing.    AMPICILLIN/SULBACTAM Value in next row Resistant      8 SENSITIVEThis is a modified FDA-approved test that has been validated and its performance characteristics determined by the reporting laboratory.  This laboratory is certified under the Clinical Laboratory Improvement Amendments CLIA as qualified to perform high complexity clinical laboratory testing.    PIP/TAZO Value in next row Sensitive      <=4 SENSITIVEThis is a modified FDA-approved test that has been validated and its performance characteristics determined by the reporting laboratory.  This laboratory is certified under the Clinical Laboratory Improvement Amendments CLIA as qualified to perform high complexity clinical laboratory testing.    MEROPENEM Value in next row Sensitive      <=4 SENSITIVEThis is a modified FDA-approved test that has been validated and its performance characteristics determined by the  reporting laboratory.  This laboratory is certified under the Clinical Laboratory Improvement Amendments CLIA as qualified to perform high complexity clinical laboratory testing.    * 60,000 COLONIES/mL ESCHERICHIA COLI  Gastrointestinal Panel by PCR , Stool     Status: None   Collection Time: 03/10/24 12:48 PM   Specimen: Stool  Result Value Ref Range Status   Campylobacter species NOT DETECTED NOT DETECTED Final   Plesimonas shigelloides NOT DETECTED NOT DETECTED Final   Salmonella species NOT DETECTED NOT DETECTED Final   Yersinia enterocolitica NOT DETECTED NOT DETECTED Final   Vibrio species NOT DETECTED NOT DETECTED Final   Vibrio cholerae NOT DETECTED NOT DETECTED Final   Enteroaggregative E coli (EAEC) NOT DETECTED NOT DETECTED Final   Enteropathogenic E coli (EPEC) NOT DETECTED NOT DETECTED Final   Enterotoxigenic E coli (ETEC) NOT DETECTED NOT DETECTED Final   Shiga like toxin producing E coli (STEC) NOT DETECTED NOT DETECTED Final   Shigella/Enteroinvasive E coli (EIEC) NOT DETECTED NOT DETECTED Final   Cryptosporidium NOT DETECTED NOT DETECTED Final   Cyclospora cayetanensis NOT DETECTED NOT DETECTED Final   Entamoeba histolytica NOT DETECTED NOT DETECTED Final   Giardia lamblia NOT DETECTED NOT DETECTED Final   Adenovirus F40/41 NOT DETECTED NOT DETECTED Final   Astrovirus NOT DETECTED NOT DETECTED Final   Norovirus GI/GII NOT DETECTED NOT DETECTED Final   Rotavirus A NOT DETECTED NOT DETECTED Final   Sapovirus (I, II, IV, and V) NOT DETECTED NOT DETECTED Final    Comment: Performed at [facility], [address] [place], [place] [address]    Radiology Studies: No results found.  Scheduled Meds:  Chlorhexidine  Gluconate Cloth  6 each Topical Daily   famotidine   20 mg Oral Daily   FLUoxetine   10 mg Oral Daily   gabapentin   200 mg Oral QHS   Gerhardt's butt cream   Topical BID   heparin   5,000 Units Subcutaneous Q8H   insulin  aspart  0-9 Units Subcutaneous  Q4H   multivitamin  1 tablet Oral Q2000   rosuvastatin   20 mg Oral Daily  thiamine   100 mg Oral Daily   zinc  sulfate (50mg  elemental zinc )  220 mg Oral Daily   Continuous Infusions:  cefTRIAXone  (ROCEPHIN )  IV Stopped (03/13/24 1125)   metronidazole  Stopped (03/13/24 1042)    LOS: 5 days   Alejandro Marker, DO [facility] Available via Epic secure chat 7am-7pm After these hours, please refer to coverage provider listed on [email or web address] 03/13/2024, 8:58 PM  "

## 2024-03-14 LAB — COMPREHENSIVE METABOLIC PANEL WITH GFR
ALT: 27 U/L (ref 0–44)
AST: 44 U/L — ABNORMAL HIGH (ref 15–41)
Albumin: 2.9 g/dL — ABNORMAL LOW (ref 3.5–5.0)
Alkaline Phosphatase: 78 U/L (ref 38–126)
Anion gap: 8 (ref 5–15)
BUN: 21 mg/dL (ref 8–23)
CO2: 26 mmol/L (ref 22–32)
Calcium: 8.2 mg/dL — ABNORMAL LOW (ref 8.9–10.3)
Chloride: 109 mmol/L (ref 98–111)
Creatinine, Ser: 1.76 mg/dL — ABNORMAL HIGH (ref 0.44–1.00)
GFR, Estimated: 31 mL/min — ABNORMAL LOW
Glucose, Bld: 170 mg/dL — ABNORMAL HIGH (ref 70–99)
Potassium: 3.5 mmol/L (ref 3.5–5.1)
Sodium: 143 mmol/L (ref 135–145)
Total Bilirubin: <0.2 mg/dL (ref 0.0–1.2)
Total Protein: 5.2 g/dL — ABNORMAL LOW (ref 6.5–8.1)

## 2024-03-14 LAB — CBC WITH DIFFERENTIAL/PLATELET
Abs Immature Granulocytes: 0.02 K/uL (ref 0.00–0.07)
Basophils Absolute: 0 K/uL (ref 0.0–0.1)
Basophils Relative: 1 %
Eosinophils Absolute: 0.1 K/uL (ref 0.0–0.5)
Eosinophils Relative: 2 %
HCT: 28.8 % — ABNORMAL LOW (ref 36.0–46.0)
Hemoglobin: 9.7 g/dL — ABNORMAL LOW (ref 12.0–15.0)
Immature Granulocytes: 0 %
Lymphocytes Relative: 35 %
Lymphs Abs: 2.3 K/uL (ref 0.7–4.0)
MCH: 28.4 pg (ref 26.0–34.0)
MCHC: 33.7 g/dL (ref 30.0–36.0)
MCV: 84.5 fL (ref 80.0–100.0)
Monocytes Absolute: 0.6 K/uL (ref 0.1–1.0)
Monocytes Relative: 9 %
Neutro Abs: 3.6 K/uL (ref 1.7–7.7)
Neutrophils Relative %: 53 %
Platelets: 231 K/uL (ref 150–400)
RBC: 3.41 MIL/uL — ABNORMAL LOW (ref 3.87–5.11)
RDW: 14.5 % (ref 11.5–15.5)
WBC: 6.7 K/uL (ref 4.0–10.5)
nRBC: 0 % (ref 0.0–0.2)

## 2024-03-14 LAB — GLUCOSE, CAPILLARY
Glucose-Capillary: 164 mg/dL — ABNORMAL HIGH (ref 70–99)
Glucose-Capillary: 131 mg/dL — ABNORMAL HIGH (ref 70–99)

## 2024-03-14 LAB — MAGNESIUM: Magnesium: 1.8 mg/dL (ref 1.7–2.4)

## 2024-03-14 LAB — PHOSPHORUS: Phosphorus: 3.4 mg/dL (ref 2.5–4.6)

## 2024-03-14 MED ORDER — GERHARDT'S BUTT CREAM: 1.0000 | TOPICAL_CREAM | Freq: Two times a day (BID) | CUTANEOUS | 0 refills | Status: AC

## 2024-03-14 MED ORDER — GABAPENTIN 100 MG PO CAPS: 200.0000 mg | ORAL_CAPSULE | Freq: Every day | ORAL | 0 refills | Status: AC

## 2024-03-14 MED ORDER — RENA-VITE PO TABS: 1.0000 | ORAL_TABLET | Freq: Every day | ORAL | 0 refills | Status: AC

## 2024-03-14 MED ORDER — POLYETHYLENE GLYCOL 3350 17 G PO PACK: 17.0000 g | PACK | Freq: Every day | ORAL | 0 refills | Status: AC | PRN

## 2024-03-14 MED ORDER — MAGNESIUM SULFATE 2 GM/50ML IV SOLN
2.0000 g | Freq: Once | INTRAVENOUS | Status: AC
  Administered 2024-03-14: 2 g via INTRAVENOUS
  Filled 2024-03-14: qty 50

## 2024-03-14 MED ORDER — VITAMIN B-1 100 MG PO TABS: 100.0000 mg | ORAL_TABLET | Freq: Every day | ORAL | 0 refills | Status: AC

## 2024-03-14 MED ORDER — ACETAMINOPHEN 325 MG PO TABS: 650.0000 mg | ORAL_TABLET | ORAL | 0 refills | Status: AC | PRN

## 2024-03-14 MED ORDER — FAMOTIDINE 20 MG PO TABS: 20.0000 mg | ORAL_TABLET | Freq: Every day | ORAL | 0 refills | Status: AC

## 2024-03-14 MED ORDER — ZINC SULFATE 220 (50 ZN) MG PO CAPS: 220.0000 mg | ORAL_CAPSULE | Freq: Every day | ORAL | 0 refills | Status: AC

## 2024-03-14 MED ORDER — POTASSIUM CHLORIDE CRYS ER 20 MEQ PO TBCR
40.0000 meq | EXTENDED_RELEASE_TABLET | Freq: Two times a day (BID) | ORAL | Status: DC
  Administered 2024-03-14: 40 meq via ORAL
  Filled 2024-03-14: qty 2

## 2024-03-14 NOTE — Evaluation (Signed)
 Physical Therapy Evaluation Patient Details Name: Joann Campos MRN: [id number] DOB: December 09, 1955 Today's Date: 03/14/2024  History of Present Illness  Pt is a 68 y.o. female presenting 12/19 with flu like symptoms, n/v, abdominal discomfort. Found to have renal failure, shock, severe lactic acidosis. Treated with IV fluid, placed on CRRT with improvement. PMH: DMII, anxiety, HLD, diabetic polyneuropathy, hyperlipidemia, nonbleeding internal hemorrhoids, rectal polyp (Simultaneous filing. User may not have seen previous data.)  Clinical Impression  PTA, pt was Ind with all mobility, IADLs, and driving. Reports x1 fall just before admission, however no other in the last 6 months. She is IND with all mobility without need for a device, tolerated session well on RA, and vitals remained stable. Standing BP: 120/74. Pt has no follow up therapy needs and appears to be at her functional baseline. Pt is clear for discharge.         If plan is discharge home, recommend the following:     Can travel by private vehicle        Equipment Recommendations    Recommendations for Other Services       Functional Status Assessment       Precautions / Restrictions Precautions Precautions: None Recall of Precautions/Restrictions: Intact Restrictions Weight Bearing Restrictions Per Provider Order: No      Mobility  Bed Mobility Overal bed mobility: Independent                  Transfers Overall transfer level: Independent                      Ambulation/Gait Ambulation/Gait assistance: Contact guard assist Gait Distance (Feet): 300 Feet Assistive device: None     Gait velocity interpretation: >4.37 ft/sec, indicative of normal walking speed   General Gait Details: midl path deviation while completing head turns, however no LOB, good speed and gait mechanics  Stairs            Wheelchair Mobility     Tilt Bed    Modified Rankin (Stroke Patients Only)        Balance Overall balance assessment: Independent                               Standardized Balance Assessment Standardized Balance Assessment : Dynamic Gait Index   Dynamic Gait Index Level Surface: Normal Change in Gait Speed: Normal Gait with Horizontal Head Turns: Mild Impairment Gait with Vertical Head Turns: Mild Impairment Gait and Pivot Turn: Normal Step Over Obstacle: Normal Step Around Obstacles: Normal Steps: Mild Impairment Total Score: 21       Pertinent Vitals/Pain Pain Assessment Pain Assessment: No/denies pain    Home Living Family/patient expects to be discharged to:: Private residence Living Arrangements: Spouse/significant other Available Help at Discharge: Family Type of Home: House Home Access: Stairs to enter Entrance Stairs-Rails: Left (left HR at first 2 steps, none at second 2 steps) Entrance Stairs-Number of Steps: 4 (2 and 2)   Home Layout: One level Home Equipment: None      Prior Function Prior Level of Function : Independent/Modified Independent;History of Falls (last six months);Driving             Mobility Comments: IND mobility no DME, drives, fell just before coming here, no major injuries ADLs Comments: independent     Extremity/Trunk Assessment   Upper Extremity Assessment Upper Extremity Assessment: Defer to OT evaluation    Lower Extremity Assessment  Lower Extremity Assessment: Overall WFL for tasks assessed    Cervical / Trunk Assessment Cervical / Trunk Assessment: Normal  Communication   Communication Communication: No apparent difficulties    Cognition Arousal: Alert Behavior During Therapy: WFL for tasks assessed/performed   PT - Cognitive impairments: No apparent impairments                         Following commands: Intact       Cueing Cueing Techniques: Verbal cues     General Comments General comments (skin integrity, edema, etc.): VSS    Exercises      Assessment/Plan    PT Assessment Patient does not need any further PT services  PT Problem List         PT Treatment Interventions      PT Goals (Current goals can be found in the Care Plan section)  Acute Rehab PT Goals PT Goal Formulation: All assessment and education complete, DC therapy    Frequency       Co-evaluation PT/OT/SLP Co-Evaluation/Treatment: Yes Reason for Co-Treatment: Other (comment);Necessary to address cognition/behavior during functional activity (immenent discharge) PT goals addressed during session: Mobility/safety with mobility;Strengthening/ROM OT goals addressed during session: ADL's and self-care;Strengthening/ROM       AM-PAC PT 6 Clicks Mobility  Outcome Measure Help needed turning from your back to your side while in a flat bed without using bedrails?: None Help needed moving from lying on your back to sitting on the side of a flat bed without using bedrails?: None Help needed moving to and from a bed to a chair (including a wheelchair)?: None Help needed standing up from a chair using your arms (e.g., wheelchair or bedside chair)?: None Help needed to walk in hospital room?: None Help needed climbing 3-5 steps with a railing? : A Little 6 Click Score: 23    End of Session Equipment Utilized During Treatment: Gait belt Activity Tolerance: Patient tolerated treatment well Patient left: in bed;with family/visitor present Nurse Communication: Mobility status;Other (comment) (no follow up therapy needs, cleared from PT for d/c)      Time: 1027-1041 PT Time Calculation (min) (ACUTE ONLY): 14 min   Charges:   PT Evaluation $PT Eval Low Complexity: 1 Low   PT General Charges $$ ACUTE PT VISIT: 1 Visit         Joann Campos, PT, DPT   [employer] 03/14/2024, 10:47 AM

## 2024-03-14 NOTE — Plan of Care (Signed)
°  Problem: Metabolic: Goal: Ability to maintain appropriate glucose levels will improve Outcome: Progressing   Problem: Nutritional: Goal: Progress toward achieving an optimal weight will improve Outcome: Progressing   Problem: Tissue Perfusion: Goal: Adequacy of tissue perfusion will improve Outcome: Progressing

## 2024-03-14 NOTE — Evaluation (Signed)
 Occupational Therapy Evaluation Patient Details Name: Joann Campos MRN: [id number] DOB: 01/06/1956 Today's Date: 03/14/2024   History of Present Illness   Pt is a 68 y.o. female presenting 12/19 with flu like symptoms, n/v, abdominal discomfort. Found to have renal failure, shock, severe lactic acidosis. Treated with IV fluid, placed on CRRT with improvement. PMH: DMII, anxiety, HLD, diabetic polyneuropathy, hyperlipidemia, nonbleeding internal hemorrhoids, rectal polyp (Simultaneous filing. User may not have seen previous data.)     Clinical Impressions PTA, pt lived with spouse and was independent. Upon eval, pt mod I for all ADL; noting very mildly decreased activity tolerance as compared to her baseline. Discussed activity pacing/balance between rest and activity as her body continues to recover. No further acute needs identified. Thank you for this order. OT to sign off.      If plan is discharge home, recommend the following:   Other (comment) (on pt request)     Functional Status Assessment   Patient has had a recent decline in their functional status and demonstrates the ability to make significant improvements in function in a reasonable and predictable amount of time.     Equipment Recommendations   None recommended by OT     Recommendations for Other Services         Precautions/Restrictions   Precautions Precautions: None Restrictions Weight Bearing Restrictions Per Provider Order: No     Mobility Bed Mobility Overal bed mobility: Independent                  Transfers Overall transfer level: Independent                        Balance Overall balance assessment: Independent                                         ADL either performed or assessed with clinical judgement   ADL Overall ADL's : Independent                                             Vision Baseline Vision/History: 0  No visual deficits Ability to See in Adequate Light: 0 Adequate Patient Visual Report: No change from baseline Vision Assessment?: No apparent visual deficits     Perception         Praxis         Pertinent Vitals/Pain       Extremity/Trunk Assessment Upper Extremity Assessment Upper Extremity Assessment: Defer to OT evaluation   Lower Extremity Assessment Lower Extremity Assessment: Overall WFL for tasks assessed   Cervical / Trunk Assessment Cervical / Trunk Assessment: Normal   Communication Communication Communication: No apparent difficulties   Cognition Arousal: Alert Behavior During Therapy: WFL for tasks assessed/performed                                 Following commands: Intact       Cueing  General Comments   Cueing Techniques: Verbal cues  VSS   Exercises     Shoulder Instructions      Home Living Family/patient expects to be discharged to:: Private residence Living Arrangements: Spouse/significant other Available Help at Discharge: Family Type of Home:  House Home Access: Stairs to enter [employer] of Steps: 4 (2 and 2) Entrance Stairs-Rails: Left (left HR at first 2 steps, none at second 2 steps) Home Layout: One level     Bathroom Shower/Tub: Chief Strategy Officer: Handicapped height (toilet riser over seat)     Home Equipment: None          Prior Functioning/Environment Prior Level of Function : Independent/Modified Independent;History of Falls (last six months);Driving             Mobility Comments: IND mobility no DME, drives, fell just before coming here, no major injuries ADLs Comments: independent    OT Problem List: Decreased activity tolerance   OT Treatment/Interventions:        OT Goals(Current goals can be found in the care plan section)   Acute Rehab OT Goals Patient Stated Goal: go home OT Goal Formulation: With patient Time For Goal Achievement:  03/28/24 Potential to Achieve Goals: Good   OT Frequency:       Co-evaluation PT/OT/SLP Co-Evaluation/Treatment: Yes Reason for Co-Treatment: Other (comment);Necessary to address cognition/behavior during functional activity (immenent discharge) PT goals addressed during session: Mobility/safety with mobility;Strengthening/ROM OT goals addressed during session: ADL's and self-care;Strengthening/ROM      AM-PAC OT 6 Clicks Daily Activity     Outcome Measure Help from another person eating meals?: None Help from another person taking care of personal grooming?: None Help from another person toileting, which includes using toliet, bedpan, or urinal?: None Help from another person bathing (including washing, rinsing, drying)?: None Help from another person to put on and taking off regular upper body clothing?: None Help from another person to put on and taking off regular lower body clothing?: None 6 Click Score: 24   End of Session Equipment Utilized During Treatment: Gait belt Nurse Communication: Mobility status  Activity Tolerance: Patient tolerated treatment well Patient left: in bed;with call bell/phone within reach;with family/visitor present  OT Visit Diagnosis: Other (comment) (deconditioning)                Time: 8972-8958 OT Time Calculation (min): 14 min Charges:  OT General Charges $OT Visit: 1 Visit OT Evaluation $OT Eval Low Complexity: 1 Low  Joann Campos FREDERICK, OTR/L [facility] Acute Rehabilitation Office: [phone]   Joann Campos 03/14/2024, 10:46 AM

## 2024-03-16 NOTE — Discharge Summary (Signed)
 " Physician Discharge Summary   Patient: Joann Campos MRN: [id number] DOB: 1956/03/17  Admit date:     03/08/2024  Discharge date: 03/14/2024  Discharge Physician: Alejandro Marker, DO   PCP: Tobie Gaines, DO   Recommendations at discharge:   Follow-up with PCP within 1 to 2 weeks repeat CBC, CMP, mag, Phos within 1 week Follow-up with nephrology in outpatient setting within 1 to 2 weeks Do not resume metformin  at discharge  Discharge Diagnoses: Principal Problem:   Septic shock (HCC) Active Problems:   AKI (acute kidney injury)   Malnutrition of moderate degree  Resolved Problems:   * No resolved hospital problems. [facility] Course: 68 yo F w/ pertinent PMH DMT2, HLD, peripheral neuropathy, anxiety presents to [facility] ED on 12/19 w/ septic shock and aki.   For the past 2 weeks patient has had N/V, generalized mild abd discomfort, poor po intake. Denies fever, diarrhea or constipation. Was seen by urgent care on 12/9 and given fluids. Thought related to viral illness educated to push po fluids. Symptoms persists and came to [facility] ED on 12/19 for further workup. On arrival hypotensive. Afebrile and wbc 19. LA 13. Cultures obtained, given fluids, and started on broad spectrum abx. CT abd/pelvis w/ no acute findings; chronic bibasilar atelectasis, 1-2 mm nonobstructive left renal calculus. Creat 10.9, bun 87, co2 8, AG 47, k 4.2, calcium  10.4. VBG 7.01, 27, 46, 6.8. Nephro consulted. Despite fluids remained hypotensive and started on levo. Pccm consulted for icu admission. BP improved and off vasopressors.  HD catheter removed 12/22.  Transferred to [facility] service on 12/23.  She subsequently continued to improve and was deemed medically stable for discharge only to follow-up with PCP and nephrology in outpatient setting.  Assessment and Plan:  Septic and Hypovolemic Shock: MRSA neg -UA 21-50 WBC -on empiric ceftriaxone  and metronidazole , plan 5 days with last day abx 12/24 -sepsis physiology  resolved -CT a/p was neg for acute findings. Bibasilar subsegmental atelectasis. 1-2 mm nonobstructive left renal calculus  -PT/OT to evaluate and Treat and recommending no follow-up   E Coli UTI: UA 21-50 WBC -Urine Culture = E coli -WBC went from 12.5 -> 6.6 and is now 6.7 at the time of discharge -finish 5 days abx of IV Ceftriaxone  1 gram q24h   AKI: Baseline 0.6-0.9. -BUN/Cr Trend: Recent Labs  Lab 03/10/24 1653 03/11/24 0231 03/11/24 1519 03/12/24 0325 03/13/24 0349 03/13/24 0353 03/14/24 0332  BUN 24* 22 20 16 16 16 21   CREATININE 2.71* 2.83* 2.53* 2.28* 1.93* 1.93* 1.76*  -Presented Serum Cr of 10.90. Nephrology consulted and was on CRRT and now this was D/C'd 12/20. HD Catheter Removed 12/22 -Avoid Nephrotoxic Medications, Contrast Dyes, Hypotension and Dehydration to Ensure Adequate Renal Perfusion and will need to Renally Adjust Meds -Continue to Monitor and Trend Renal Function carefully and repeat CMP within 1 week and follow-up with nephrology outpatient setting   DM2 with Hyperglycemia:  HbA1c was 6.8. Continue Sensitive Novolog  SSI AC 0-9 q4h while hospitalized. CTM CBGs per Protocol. CBG Trend:  Recent Labs  Lab 03/13/24 0827 03/13/24 1109 03/13/24 1524 03/13/24 1945 03/13/24 2311 03/14/24 0331 03/14/24 0803  GLUCAP 100* 255* 163* 169* 247* 164* 131*    Hypernatremia: Na+ is improved and now 143. CTM and Trend and repeat CMP within 1 week  HLD: Continue Rosuvastatin  20 mg po Daily    Hypokalemia: K+ is now 3.5. CTM and replete w/ po KCL 40 mEQ x1 prior to discharge.  Continue monitor  and replete as necessary repeat CMP within 1 week  Hypomagesemia: Mag Level is 1.8. Replete w/ IV Mag Sulfate 2 grams. CTM and Replete as Necessary   AGMA: Resolved. Possible Metformin  Toxicity induced Lactic Acidosis. Resolved as CO2 is 26, Chloride Level is 109, and AG is 8. CTM and trend and repeat CMP in the AM    Peripheral Neuropathy: Resumed Gabapentin  200 m po qHS    Anxiety: Was holding Fluoxetine  10 mg po daily but resumed 12/23   Diarrhea: GIP neg and has no abd pain. C/w prn imodium   Normocytic Anemia: Hgb/Hct Trend:  Recent Labs  Lab 03/08/24 1015 03/09/24 0508 03/10/24 0322 03/13/24 0353 03/14/24 0332  HGB 13.2 9.5* 9.3* 9.9* 9.7*  HCT 41.8 27.4* 26.9* 28.9* 28.8*  MCV 89.9 80.6 82.0 82.8 84.5  -Check Anemia Panel in the outpatient setting. CTM for S/Sx of Bleeding; No overt bleeding noted. Repeat CBC in the AM  Non-Severe (Moderate) Malnutrition in the context of chronic illness: Nutrition Status: Nutrition Problem: Moderate Malnutrition Etiology: chronic illness Signs/Symptoms: mild fat depletion, moderate muscle depletion Interventions: Refer to RD note for recommendations, Magic cup, MVI, Liberalize Diet  Hypoalbuminemia: Patient's Albumin Trend: Recent Labs  Lab 03/08/24 1015 03/09/24 0237 03/11/24 0231 03/12/24 0325 03/13/24 0349 03/14/24 0332  ALBUMIN 4.2 3.1* 3.0* 3.0* 2.8* 2.9*  -Continue to Monitor and Trend and repeat CMP in the AM  GERD/GI Prophylaxis: C/w Famotidine  20 mg po Daily.  Nutrition Documentation    Flowsheet Row ED to Hosp-Admission (Discharged) from 03/08/2024 in [facility]-ICU/STEPDOWN  Nutrition Problem Moderate Malnutrition  Etiology chronic illness  Nutrition Goal Patient will meet greater than or equal to 90% of their needs  Interventions Refer to RD note for recommendations, Magic cup, MVI, Liberalize Diet   Consultants: Nephrology, PCCM transfer Procedures performed: As delineated as above Disposition: Home Diet recommendation:  Discharge Diet Orders (From admission, onward)     Start     Ordered   03/14/24 0000  Diet Carb Modified        03/14/24 1003           Cardiac diet DISCHARGE MEDICATION: Allergies as of 03/14/2024       Reactions   Metformin  And Related Other (See Comments)   Lactic acidosis        Medication List     STOP taking these  medications    BD Pen Needle Nano 2nd Gen 32G X 4 MM Misc Generic drug: Insulin  Pen Needle   Lantus  SoloStar 100 UNIT/ML Solostar Pen Generic drug: insulin  glargine   MULTIVITAMIN ADULTS PO       TAKE these medications    acetaminophen  325 MG tablet Commonly known as: TYLENOL  Take 2 tablets (650 mg total) by mouth every 4 (four) hours as needed for mild pain (pain score 1-3) or fever (temp > 101.5).   Contour Next Test test strip Generic drug: glucose blood USE AS DIRECTED   famotidine  20 MG tablet Commonly known as: PEPCID  Take 1 tablet (20 mg total) by mouth daily.   FLUoxetine  10 MG capsule Commonly known as: PROZAC  TAKE 1 CAPSULE BY MOUTH DAILY   gabapentin  100 MG capsule Commonly known as: NEURONTIN  Take 2 capsules (200 mg total) by mouth at bedtime. What changed:  medication strength how much to take   Gerhardt's butt cream Crea Apply 1 Application topically 2 (two) times daily.   Microlet Lancets Misc TEST ONCE DAILY   multivitamin Tabs tablet Take 1 tablet by mouth  daily at 8 pm.   ondansetron  4 MG disintegrating tablet Commonly known as: ZOFRAN -ODT Take 1 tablet (4 mg total) by mouth every 8 (eight) hours as needed. What changed: reasons to take this   polyethylene glycol 17 g packet Commonly known as: MIRALAX  / GLYCOLAX  Take 17 g by mouth daily as needed for moderate constipation.   rosuvastatin  20 MG tablet Commonly known as: CRESTOR  TAKE 1 TABLET BY MOUTH DAILY   thiamine  100 MG tablet Commonly known as: Vitamin B-1 Take 1 tablet (100 mg total) by mouth daily.   VITAMIN B 12 PO Take 1 tablet by mouth daily. One tablet   zinc  sulfate (50mg  elemental zinc ) 220 (50 Zn) MG capsule Take 1 capsule (220 mg total) by mouth daily for 10 days.        Discharge Exam: Filed Weights   03/10/24 0500 03/13/24 0500 03/14/24 0500  Weight: 64.5 kg 62.6 kg 68.7 kg   Vitals:   03/14/24 1000 03/14/24 1100  BP:    Pulse: 82 82  Resp: 18 15   Temp:    SpO2: 94% 99%   Examination: Physical Exam:  Constitutional: WN/WD slightly overweight Caucasian female no acute distress Respiratory: Diminished to auscultation bilaterally, no wheezing, rales, rhonchi or crackles. Normal respiratory effort and patient is not tachypenic. No accessory muscle use.  Unlabored breathing Cardiovascular: RRR, no murmurs / rubs / gallops. S1 and S2 auscultated. No extremity edema.  Abdomen: Soft, non-tender, mildly distended secondary but habitus. Bowel sounds positive.  GU: Deferred. Musculoskeletal: No clubbing / cyanosis of digits/nails. No joint deformity upper and lower extremities.  Skin: No rashes, lesions, ulcers on limited skin evaluation. No induration; Warm and dry.  Neurologic: CN 2-12 grossly intact with no focal deficits. Romberg sign and cerebellar reflexes not assessed.  Psychiatric: Normal judgment and insight. Alert and oriented x 3. Normal mood and appropriate affect.   Condition at discharge: stable  The results of significant diagnostics from this hospitalization (including imaging, microbiology, ancillary and laboratory) are listed below for reference.   Imaging Studies: CT HEAD WO CONTRAST ( ) Result Date: 03/10/2024 CLINICAL DATA:  Initial evaluation for acute altered mental status. EXAM: CT HEAD WITHOUT CONTRAST TECHNIQUE: Contiguous axial images were obtained from the base of the skull through the vertex without intravenous contrast. RADIATION DOSE REDUCTION: This exam was performed according to the departmental dose-optimization program which includes automated exposure control, adjustment of the mA and/or kV according to patient size and/or use of iterative reconstruction technique. COMPARISON:  None Available. FINDINGS: Brain: Cerebral volume within normal limits for patient age. No acute intracranial hemorrhage. No acute large vessel territory infarct. No mass lesion, midline shift, or mass effect. Ventricles are normal in  size without hydrocephalus. No extra-axial fluid collection. Vascular: No abnormal hyperdense vessel. Skull: Scalp soft tissues demonstrate no acute abnormality. Calvarium intact. Sinuses/Orbits: Globes and orbital soft tissues within normal limits. Trace layering secretions present within the left sphenoid sinus. Paranasal sinuses are otherwise clear. No mastoid effusion. IMPRESSION: Normal head CT. No acute intracranial abnormality. Electronically Signed   By: Morene Hoard M.D.   On: 03/10/2024 19:44   DG Chest Portable 1 View Result Date: 03/08/2024 CLINICAL DATA:  Central line placement. EXAM: PORTABLE CHEST 1 VIEW COMPARISON:  None Available. FINDINGS: Right internal jugular central line tip overlies the lower SVC. No pneumothorax. The heart is borderline enlarged. Lower thorax not entirely included in the field of view. No large pleural effusion. No confluent opacity. No evidence of pulmonary edema.  On limited assessment, no acute osseous findings. IMPRESSION: Right internal jugular central line tip overlies the lower SVC. No pneumothorax. Electronically Signed   By: Andrea Gasman M.D.   On: 03/08/2024 17:10   US  RENAL Result Date: 03/08/2024 CLINICAL DATA:  Acute kidney injury. EXAM: RENAL / URINARY TRACT ULTRASOUND COMPLETE COMPARISON:  None Available. FINDINGS: Right Kidney: Renal measurements: 12.0 cm x 5.1 cm x 4.8 cm = volume: 154.2 mL. Echogenicity within normal limits. No mass or hydronephrosis visualized. Left Kidney: Renal measurements: 12.7 cm x 5.4 cm x 5.5 cm = volume: 198 mL. Echogenicity within normal limits. No mass or hydronephrosis visualized. Bladder: Appears normal for degree of bladder distention. Other: None. IMPRESSION: Unremarkable renal ultrasound. Electronically Signed   By: Suzen Dials M.D.   On: 03/08/2024 16:13   CT ABDOMEN PELVIS WO CONTRAST Result Date: 03/08/2024 EXAM: CT ABDOMEN AND PELVIS WITHOUT CONTRAST 03/08/2024 12:06:27 PM TECHNIQUE: CT of the  abdomen and pelvis was performed without the administration of intravenous contrast. Multiplanar reformatted images are provided for review. Automated exposure control, iterative reconstruction, and/or weight-based adjustment of the mA/kV was utilized to reduce the radiation dose to as low as reasonably achievable. COMPARISON: 10/04/2023 CLINICAL HISTORY: Abdominal pain, acute, nonlocalized. FINDINGS: LOWER CHEST: Subsegmental atelectasis in both lower lobes. LIVER: The liver is unremarkable. GALLBLADDER AND BILE DUCTS: Cholecystectomy. No biliary ductal dilatation. SPLEEN: No acute abnormality. PANCREAS: No acute abnormality. ADRENAL GLANDS: No acute abnormality. KIDNEYS, URETERS AND BLADDER: 1 to 2 mm left mid kidney nonobstructive renal calculus, image 124 series 9. No stones in the right kidney or ureters. No hydronephrosis. No perinephric or periureteral stranding. Urinary bladder is unremarkable. GI AND BOWEL: Mild type 1 hiatal hernia. Mild sigmoid colon diverticulosis. Mostly gasless bowel without bowel dilatation noted. Stomach demonstrates no acute abnormality. There is no bowel obstruction. PERITONEUM AND RETROPERITONEUM: No ascites. No free air. VASCULATURE: Systemic atherosclerosis is present, including the aorta and iliac arteries. LYMPH NODES: No lymphadenopathy. REPRODUCTIVE ORGANS: No acute abnormality. BONES AND SOFT TISSUES: Transitional S1 vertebra. A small immediately supraumbilical hernia contains adipose tissue. No acute osseous abnormality. No focal soft tissue abnormality. IMPRESSION: 1. No acute findings in the abdomen or pelvis. 2. Chronic and incidental findings include bibasilar subsegmental atelectasis, prior cholecystectomy, mild type 1 hiatal hernia, a 1 to 2 mm nonobstructive left renal calculus, mild sigmoid diverticulosis, systemic atherosclerosis including the aorta and iliac arteries, transitional S1 vertebra, and a small supraumbilical fat-containing hernia. Electronically  signed by: Ryan Salvage MD 03/08/2024 12:54 PM EST RP Workstation: [id number]   Microbiology: Results for orders placed or performed during the hospital encounter of 03/08/24  Culture, blood (single)     Status: None   Collection Time: 03/08/24  2:07 PM   Specimen: BLOOD RIGHT FOREARM  Result Value Ref Range Status   Specimen Description   Final    BLOOD RIGHT FOREARM Performed at [facility], [address], [place], [place] [address]    Special Requests   Final    BOTTLES DRAWN AEROBIC AND ANAEROBIC Blood Culture results may not be optimal due to an inadequate volume of blood received in culture bottles Performed at [facility], [address], [place], [place] [address]    Culture   Final    NO GROWTH 5 DAYS Performed at [facility], [address], [place], [place] [address]    Report Status 03/13/2024 FINAL  Final  MRSA Next Gen by PCR, Nasal  Status: None   Collection Time: 03/08/24  5:33 PM   Specimen: Urine, Clean Catch; Nasal Swab  Result Value Ref Range Status   MRSA by PCR Next Gen NOT DETECTED NOT DETECTED Final    Comment: (NOTE) The GeneXpert MRSA Assay (FDA approved for NASAL specimens only), is one component of a comprehensive MRSA colonization surveillance program. It is not intended to diagnose MRSA infection nor to guide or monitor treatment for MRSA infections. Test performance is not FDA approved in patients less than 46 years old. Performed at [facility], [address], [place], [place] [address]   Respiratory (~20 pathogens) panel by PCR     Status: None   Collection Time: 03/08/24  6:34 PM   Specimen: Urine, Clean Catch; Respiratory  Result Value Ref Range Status   Adenovirus NOT DETECTED NOT DETECTED Final   Coronavirus 229E NOT DETECTED NOT DETECTED Final    Comment: (NOTE) The Coronavirus on the Respiratory Panel, DOES NOT test for the novel  Coronavirus (2019 nCoV)    Coronavirus  HKU1 NOT DETECTED NOT DETECTED Final   Coronavirus NL63 NOT DETECTED NOT DETECTED Final   Coronavirus OC43 NOT DETECTED NOT DETECTED Final   Metapneumovirus NOT DETECTED NOT DETECTED Final   Rhinovirus / Enterovirus NOT DETECTED NOT DETECTED Final   Influenza A NOT DETECTED NOT DETECTED Final   Influenza B NOT DETECTED NOT DETECTED Final   Parainfluenza Virus 1 NOT DETECTED NOT DETECTED Final   Parainfluenza Virus 2 NOT DETECTED NOT DETECTED Final   Parainfluenza Virus 3 NOT DETECTED NOT DETECTED Final   Parainfluenza Virus 4 NOT DETECTED NOT DETECTED Final   Respiratory Syncytial Virus NOT DETECTED NOT DETECTED Final   Bordetella pertussis NOT DETECTED NOT DETECTED Final   Bordetella Parapertussis NOT DETECTED NOT DETECTED Final   Chlamydophila pneumoniae NOT DETECTED NOT DETECTED Final   Mycoplasma pneumoniae NOT DETECTED NOT DETECTED Final    Comment: Performed at [facility], [address] [place], [place] [address]  Urine Culture     Status: Abnormal   Collection Time: 03/08/24  6:34 PM   Specimen: Urine, Random  Result Value Ref Range Status   Specimen Description   Final    URINE, RANDOM Performed at [facility], [address] [place], [place] [address]    Special Requests   Final    NONE Reflexed from [id number] Performed at [facility], [address], [place], [place] [address]    Culture 60,000 COLONIES/mL ESCHERICHIA COLI (A)  Final   Report Status 03/11/2024 FINAL  Final   Organism ID, Bacteria ESCHERICHIA COLI (A)  Final      Susceptibility   Escherichia coli - MIC*    AMPICILLIN >=32 RESISTANT Resistant     CEFAZOLIN (URINE) Value in next row Sensitive      8 SENSITIVEThis is a modified FDA-approved test that has been validated and its performance characteristics determined by the reporting laboratory.  This laboratory is certified under the Clinical Laboratory Improvement Amendments CLIA as qualified to perform high  complexity clinical laboratory testing.    CEFEPIME  Value in next row Sensitive      8 SENSITIVEThis is a modified FDA-approved test that has been validated and its performance characteristics determined by the reporting laboratory.  This laboratory is certified under the Clinical Laboratory Improvement Amendments CLIA as qualified to perform high complexity clinical laboratory testing.    ERTAPENEM Value in next row Sensitive  8 SENSITIVEThis is a modified FDA-approved test that has been validated and its performance characteristics determined by the reporting laboratory.  This laboratory is certified under the Clinical Laboratory Improvement Amendments CLIA as qualified to perform high complexity clinical laboratory testing.    CEFTRIAXONE  Value in next row Sensitive      8 SENSITIVEThis is a modified FDA-approved test that has been validated and its performance characteristics determined by the reporting laboratory.  This laboratory is certified under the Clinical Laboratory Improvement Amendments CLIA as qualified to perform high complexity clinical laboratory testing.    CIPROFLOXACIN Value in next row Sensitive      8 SENSITIVEThis is a modified FDA-approved test that has been validated and its performance characteristics determined by the reporting laboratory.  This laboratory is certified under the Clinical Laboratory Improvement Amendments CLIA as qualified to perform high complexity clinical laboratory testing.    GENTAMICIN Value in next row Sensitive      8 SENSITIVEThis is a modified FDA-approved test that has been validated and its performance characteristics determined by the reporting laboratory.  This laboratory is certified under the Clinical Laboratory Improvement Amendments CLIA as qualified to perform high complexity clinical laboratory testing.    NITROFURANTOIN  Value in next row Sensitive      8 SENSITIVEThis is a modified FDA-approved test that has been validated and its  performance characteristics determined by the reporting laboratory.  This laboratory is certified under the Clinical Laboratory Improvement Amendments CLIA as qualified to perform high complexity clinical laboratory testing.    TRIMETH/SULFA Value in next row Sensitive      8 SENSITIVEThis is a modified FDA-approved test that has been validated and its performance characteristics determined by the reporting laboratory.  This laboratory is certified under the Clinical Laboratory Improvement Amendments CLIA as qualified to perform high complexity clinical laboratory testing.    AMPICILLIN/SULBACTAM Value in next row Resistant      8 SENSITIVEThis is a modified FDA-approved test that has been validated and its performance characteristics determined by the reporting laboratory.  This laboratory is certified under the Clinical Laboratory Improvement Amendments CLIA as qualified to perform high complexity clinical laboratory testing.    PIP/TAZO Value in next row Sensitive      <=4 SENSITIVEThis is a modified FDA-approved test that has been validated and its performance characteristics determined by the reporting laboratory.  This laboratory is certified under the Clinical Laboratory Improvement Amendments CLIA as qualified to perform high complexity clinical laboratory testing.    MEROPENEM Value in next row Sensitive      <=4 SENSITIVEThis is a modified FDA-approved test that has been validated and its performance characteristics determined by the reporting laboratory.  This laboratory is certified under the Clinical Laboratory Improvement Amendments CLIA as qualified to perform high complexity clinical laboratory testing.    * 60,000 COLONIES/mL ESCHERICHIA COLI  Gastrointestinal Panel by PCR , Stool     Status: None   Collection Time: 03/10/24 12:48 PM   Specimen: Stool  Result Value Ref Range Status   Campylobacter species NOT DETECTED NOT DETECTED Final   Plesimonas shigelloides NOT DETECTED NOT  DETECTED Final   Salmonella species NOT DETECTED NOT DETECTED Final   Yersinia enterocolitica NOT DETECTED NOT DETECTED Final   Vibrio species NOT DETECTED NOT DETECTED Final   Vibrio cholerae NOT DETECTED NOT DETECTED Final   Enteroaggregative E coli (EAEC) NOT DETECTED NOT DETECTED Final   Enteropathogenic E coli (EPEC) NOT DETECTED NOT DETECTED Final   Enterotoxigenic  E coli (ETEC) NOT DETECTED NOT DETECTED Final   Shiga like toxin producing E coli (STEC) NOT DETECTED NOT DETECTED Final   Shigella/Enteroinvasive E coli (EIEC) NOT DETECTED NOT DETECTED Final   Cryptosporidium NOT DETECTED NOT DETECTED Final   Cyclospora cayetanensis NOT DETECTED NOT DETECTED Final   Entamoeba histolytica NOT DETECTED NOT DETECTED Final   Giardia lamblia NOT DETECTED NOT DETECTED Final   Adenovirus F40/41 NOT DETECTED NOT DETECTED Final   Astrovirus NOT DETECTED NOT DETECTED Final   Norovirus GI/GII NOT DETECTED NOT DETECTED Final   Rotavirus A NOT DETECTED NOT DETECTED Final   Sapovirus (I, II, IV, and V) NOT DETECTED NOT DETECTED Final    Comment: Performed at [facility], [address] [place], [place] [address]   Labs: CBC: Recent Labs  Lab 03/10/24 0322 03/13/24 0353 03/14/24 0332  WBC 12.5* 6.6 6.7  NEUTROABS  --  3.6 3.6  HGB 9.3* 9.9* 9.7*  HCT 26.9* 28.9* 28.8*  MCV 82.0 82.8 84.5  PLT 250 253 231   Basic Metabolic Panel: Recent Labs  Lab 03/11/24 0231 03/11/24 0600 03/11/24 1519 03/12/24 0325 03/13/24 0349 03/13/24 0353 03/14/24 0332  NA 143  --  146* 144 146* 146* 143  K 2.7*  --  3.6 2.5* 3.0* 3.0* 3.5  CL 104  --  107 104 111 111 109  CO2 23  --  26 28 25 24 26   GLUCOSE 176*  --  126* 117* 174* 171* 170*  BUN 22  --  20 16 16 16 21   CREATININE 2.83*  --  2.53* 2.28* 1.93* 1.93* 1.76*  CALCIUM  7.3*  --  7.4* 7.2* 7.6* 7.5* 8.2*  MG  --  2.2  --   --   --  1.5* 1.8  PHOS 1.9*  --   --  3.2 2.5  --  3.4   Liver Function Tests: Recent Labs  Lab  03/11/24 0231 03/12/24 0325 03/13/24 0349 03/14/24 0332  AST  --   --   --  44*  ALT  --   --   --  27  ALKPHOS  --   --   --  78  BILITOT  --   --   --  <0.2  PROT  --   --   --  5.2*  ALBUMIN 3.0* 3.0* 2.8* 2.9*   CBG: Recent Labs  Lab 03/13/24 1524 03/13/24 1945 03/13/24 2311 03/14/24 0331 03/14/24 0803  GLUCAP 163* 169* 247* 164* 131*   Discharge time spent: greater than 30 minutes.  Signed: Alejandro Marker, DO [facility] 03/16/2024 "

## 2024-03-19 ENCOUNTER — Ambulatory Visit (INDEPENDENT_AMBULATORY_CARE_PROVIDER_SITE_OTHER): Payer: Self-pay | Admitting: Student

## 2024-03-19 ENCOUNTER — Encounter: Payer: Self-pay | Admitting: Student

## 2024-03-19 VITALS — BP 135/83 | HR 92 | Temp 98.4°F | Ht 65.0 in | Wt 136.2 lb

## 2024-03-19 DIAGNOSIS — Z794 Long term (current) use of insulin: Secondary | ICD-10-CM

## 2024-03-19 DIAGNOSIS — E1169 Type 2 diabetes mellitus with other specified complication: Secondary | ICD-10-CM | POA: Diagnosis not present

## 2024-03-19 DIAGNOSIS — E785 Hyperlipidemia, unspecified: Secondary | ICD-10-CM | POA: Diagnosis not present

## 2024-03-19 DIAGNOSIS — N179 Acute kidney failure, unspecified: Secondary | ICD-10-CM

## 2024-03-19 MED ORDER — INSULIN GLARGINE 100 UNIT/ML SOLOSTAR PEN: 10.0000 [IU] | PEN_INJECTOR | Freq: Every day | SUBCUTANEOUS | 11 refills | Status: AC

## 2024-03-19 MED ORDER — PEN NEEDLES 32G X 4 MM MISC: 1.0000 | Freq: Every day | 1 refills | Status: AC

## 2024-03-19 NOTE — Assessment & Plan Note (Addendum)
 Patient coming for hospital follow-up.  Patient developed acute renal failure in setting of dehydration and sepsis.  Patient required CRRT.  Discharge creatinine was 1.76.  Will obtain RFP today to evaluate renal function.  Patient is producing urine.  Patient has no concerns today.  Plan: - Follow-up RFP.

## 2024-03-19 NOTE — Assessment & Plan Note (Signed)
 Patient has a past medical history of well-controlled diabetes mellitus.  A1c on 03/08/2024 was 6.8.  Once discharged from the hospital, patient was instructed to stop taking metformin  and insulin .  Patient required to stop taking metformin  as this contributed to her lactic acidosis from her sepsis.  Insulin  was stopped likely in the setting of renal injury.  Patient is now improving well.  She did not stop taking her insulin  even though she was instructed to.  She has been monitoring her blood sugars and they have been in the low 100s.  She has been taking Lantus  10 units daily.  No acute concerns at this time.  Plan: - Obtain urine microalbumin creatinine ratio - Continue Lantus  10 units daily - Anticipate in 3 months when patient returns can transition her off Lantus  to SGLT2, would avoid metformin  as if patient does become septic in the future, it could cause her to have further lactic acidosis - Follow-up in 3 months

## 2024-03-19 NOTE — Patient Instructions (Addendum)
 Cherish, Runde you for allowing me to take part in your care today.  Here are your instructions.  1. Continue doing the lantus  for now. I have sent in refills.  2. Please come back in 3 months for diabetes check   3. Please await my phone call for your lab results.    PLEASE BRING YOUR MEDICATIONS TO EVERY APPOINTMENT  Thank you, Dr. Tobie  If you have any other questions please contact the internal medicine clinic at [phone] If it is after hours, please call the [place] hospital at [phone] and then ask the person who picks up for the resident on call.

## 2024-03-19 NOTE — Progress Notes (Signed)
 "  CC: Hospital follow-up  HPI:  Joann Campos is a 68 y.o. female with a past medical history of type 2 diabetes mellitus who was recently hospitalized for septic shock, acute renal failure, urinary tract infection requiring CRRT and vasopressors.  Patient remained in the hospital from 12/19-12/25.  During hospital stay patient required CRRT and vasopressors.  Patient completed treatment of antibiotics.  Patient had electrolyte derangements which were repleted.  Patient was ultimately transferred from ICU to the floor.  Patient ultimately discharged on 12/25.   Patient is here for hospital follow-up.  Past Medical History:  Diagnosis Date   Diabetes (HCC) 09/2021   Hip pain 2021   Hyperlipidemia     Current Medications[1]  Review of Systems:    Negative except for what is stated in HPI  Physical Exam:  Vitals:   03/19/24 1411  BP: 135/83  Pulse: 92  Temp: 98.4 F (36.9 C)  TempSrc: Oral  SpO2: 96%  Weight: 136 lb 3.2 oz (61.8 kg)  Height: 5' 5 (1.651 m)   General: Patient is sitting comfortably in the room  Head: Normocephalic, atraumatic  Cardio: Regular rate and rhythm, no murmurs, rubs or gallops Pulmonary: Clear to ausculation bilaterally with no rales, rhonchi, and crackles  Abdomen: Soft, nontender with normoactive bowel sounds with no rebound or guarding    Assessment & Plan:   Assessment & Plan Type 2 diabetes mellitus with hyperlipidemia (HCC) Patient has a past medical history of well-controlled diabetes mellitus.  A1c on 03/08/2024 was 6.8.  Once discharged from the hospital, patient was instructed to stop taking metformin  and insulin .  Patient required to stop taking metformin  as this contributed to her lactic acidosis from her sepsis.  Insulin  was stopped likely in the setting of renal injury.  Patient is now improving well.  She did not stop taking her insulin  even though she was instructed to.  She has been monitoring her blood sugars and they  have been in the low 100s.  She has been taking Lantus  10 units daily.  No acute concerns at this time.  Plan: - Obtain urine microalbumin creatinine ratio - Continue Lantus  10 units daily - Anticipate in 3 months when patient returns can transition her off Lantus  to SGLT2, would avoid metformin  as if patient does become septic in the future, it could cause her to have further lactic acidosis - Follow-up in 3 months AKI (acute kidney injury) Patient coming for hospital follow-up.  Patient developed acute renal failure in setting of dehydration and sepsis.  Patient required CRRT.  Discharge creatinine was 1.76.  Will obtain RFP today to evaluate renal function.  Patient is producing urine.  Patient has no concerns today.  Plan: - Follow-up RFP.  Patient discussed with Dr. Lovie Libby Blanch, DO Internal Medicine Resident PGY-3     [1]  Current Outpatient Medications:    insulin  glargine (LANTUS ) 100 UNIT/ML Solostar Pen, Inject 10 Units into the skin daily., Disp: 15 mL, Rfl: 11   Insulin  Pen Needle (PEN NEEDLES) 32G X 4 MM MISC, 1 Needle by Does not apply route daily., Disp: 100 each, Rfl: 1   acetaminophen  (TYLENOL ) 325 MG tablet, Take 2 tablets (650 mg total) by mouth every 4 (four) hours as needed for mild pain (pain score 1-3) or fever (temp > 101.5)., Disp: 20 tablet, Rfl: 0   CONTOUR NEXT TEST test strip, USE AS DIRECTED, Disp: 100 strip, Rfl: 0   Cyanocobalamin (VITAMIN B 12 PO), Take 1 tablet  by mouth daily. One tablet, Disp: , Rfl:    famotidine  (PEPCID ) 20 MG tablet, Take 1 tablet (20 mg total) by mouth daily., Disp: 30 tablet, Rfl: 0   FLUoxetine  (PROZAC ) 10 MG capsule, TAKE 1 CAPSULE BY MOUTH DAILY, Disp: 90 capsule, Rfl: 3   gabapentin  (NEURONTIN ) 100 MG capsule, Take 2 capsules (200 mg total) by mouth at bedtime., Disp: 30 capsule, Rfl: 0   Microlet Lancets MISC, TEST ONCE DAILY, Disp: 100 each, Rfl: 3   multivitamin (RENA-VIT) TABS tablet, Take 1 tablet by mouth daily at  8 pm., Disp: 30 tablet, Rfl: 0   Nystatin (GERHARDT'S BUTT CREAM) CREA, Apply 1 Application topically 2 (two) times daily., Disp: 60 each, Rfl: 0   ondansetron  (ZOFRAN -ODT) 4 MG disintegrating tablet, Take 1 tablet (4 mg total) by mouth every 8 (eight) hours as needed. (Patient taking differently: Take 4 mg by mouth every 8 (eight) hours as needed for nausea or vomiting.), Disp: 20 tablet, Rfl: 0   polyethylene glycol (MIRALAX  / GLYCOLAX ) 17 g packet, Take 17 g by mouth daily as needed for moderate constipation., Disp: 14 each, Rfl: 0   rosuvastatin  (CRESTOR ) 20 MG tablet, TAKE 1 TABLET BY MOUTH DAILY, Disp: 90 tablet, Rfl: 3   thiamine  (VITAMIN B-1) 100 MG tablet, Take 1 tablet (100 mg total) by mouth daily., Disp: 30 tablet, Rfl: 0   zinc  sulfate, 50mg  elemental zinc , 220 (50 Zn) MG capsule, Take 1 capsule (220 mg total) by mouth daily for 10 days., Disp: 10 capsule, Rfl: 0  "

## 2024-03-20 ENCOUNTER — Ambulatory Visit: Payer: Self-pay | Admitting: Student

## 2024-03-20 LAB — RENAL FUNCTION PANEL
Albumin: 3.6 g/dL — ABNORMAL LOW (ref 3.9–4.9)
BUN/Creatinine Ratio: 15 (ref 12–28)
BUN: 19 mg/dL (ref 8–27)
CO2: 28 mmol/L (ref 20–29)
Calcium: 9.4 mg/dL (ref 8.7–10.3)
Chloride: 101 mmol/L (ref 96–106)
Creatinine, Ser: 1.23 mg/dL — ABNORMAL HIGH (ref 0.57–1.00)
Glucose: 312 mg/dL — ABNORMAL HIGH (ref 70–99)
Phosphorus: 3.7 mg/dL (ref 3.0–4.3)
Potassium: 3.7 mmol/L (ref 3.5–5.2)
Sodium: 141 mmol/L (ref 134–144)
eGFR: 48 mL/min/1.73 — ABNORMAL LOW

## 2024-03-20 LAB — MICROALBUMIN / CREATININE URINE RATIO
Creatinine, Urine: 43.3 mg/dL
Microalb/Creat Ratio: 55 mg/g{creat} — ABNORMAL HIGH (ref 0–29)
Microalbumin, Urine: 23.8 ug/mL

## 2024-03-20 LAB — MAGNESIUM: Magnesium: 1.9 mg/dL (ref 1.6–2.3)

## 2024-03-20 NOTE — Progress Notes (Signed)
 Internal Medicine Clinic Attending  Case discussed with the resident at the time of the visit.  We reviewed the resident's history and exam and pertinent patient test results.  I agree with the assessment, diagnosis, and plan of care documented in the resident's note.

## 2024-04-12 ENCOUNTER — Other Ambulatory Visit
